# Patient Record
Sex: Female | Born: 1965 | Race: White | Hispanic: No | Marital: Married | Smoking: Never smoker
Health system: Southern US, Community
[De-identification: ages and names within clinical notes are randomized; demographics above are authoritative.]

## PROBLEM LIST (undated history)

## (undated) DIAGNOSIS — Z923 Personal history of irradiation: Secondary | ICD-10-CM

## (undated) DIAGNOSIS — O24419 Gestational diabetes mellitus in pregnancy, unspecified control: Secondary | ICD-10-CM

## (undated) DIAGNOSIS — C801 Malignant (primary) neoplasm, unspecified: Secondary | ICD-10-CM

## (undated) DIAGNOSIS — D702 Other drug-induced agranulocytosis: Secondary | ICD-10-CM

## (undated) DIAGNOSIS — M1990 Unspecified osteoarthritis, unspecified site: Secondary | ICD-10-CM

## (undated) DIAGNOSIS — C8590 Non-Hodgkin lymphoma, unspecified, unspecified site: Secondary | ICD-10-CM

## (undated) DIAGNOSIS — Q400 Congenital hypertrophic pyloric stenosis: Secondary | ICD-10-CM

## (undated) DIAGNOSIS — Z87442 Personal history of urinary calculi: Secondary | ICD-10-CM

## (undated) DIAGNOSIS — T4145XA Adverse effect of unspecified anesthetic, initial encounter: Secondary | ICD-10-CM

## (undated) DIAGNOSIS — C8510 Unspecified B-cell lymphoma, unspecified site: Secondary | ICD-10-CM

## (undated) HISTORY — DX: Personal history of irradiation: Z92.3

## (undated) HISTORY — PX: OTHER SURGICAL HISTORY: SHX169

## (undated) HISTORY — DX: Adverse effect of unspecified anesthetic, initial encounter: T41.45XA

## (undated) HISTORY — DX: Other disorders of bilirubin metabolism: E80.6

## (undated) HISTORY — PX: LYMPH NODE BIOPSY: SHX201

## (undated) HISTORY — DX: Unspecified B-cell lymphoma, unspecified site: C85.10

## (undated) HISTORY — DX: Malignant (primary) neoplasm, unspecified: C80.1

## (undated) HISTORY — PX: COLONOSCOPY: SHX174

## (undated) HISTORY — PX: MANDIBLE RECONSTRUCTION: SHX431

## (undated) HISTORY — DX: Congenital hypertrophic pyloric stenosis: Q40.0

## (undated) HISTORY — DX: Other drug-induced agranulocytosis: D70.2

## (undated) HISTORY — DX: Non-Hodgkin lymphoma, unspecified, unspecified site: C85.90

---
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
## 2000-01-14 ENCOUNTER — Other Ambulatory Visit: Admission: RE | Admit: 2000-01-14 | Discharge: 2000-01-14 | Payer: Self-pay | Admitting: Obstetrics and Gynecology

## 2000-07-03 ENCOUNTER — Encounter: Admission: RE | Admit: 2000-07-03 | Discharge: 2000-10-01 | Payer: Self-pay | Admitting: Obstetrics and Gynecology

## 2000-09-12 ENCOUNTER — Inpatient Hospital Stay (HOSPITAL_COMMUNITY): Admission: AD | Admit: 2000-09-12 | Discharge: 2000-09-13 | Payer: Self-pay | Admitting: Obstetrics and Gynecology

## 2000-09-15 ENCOUNTER — Encounter: Admission: RE | Admit: 2000-09-15 | Discharge: 2000-10-14 | Payer: Self-pay | Admitting: Obstetrics and Gynecology

## 2001-04-13 ENCOUNTER — Other Ambulatory Visit: Admission: RE | Admit: 2001-04-13 | Discharge: 2001-04-13 | Payer: Self-pay | Admitting: Obstetrics and Gynecology

## 2002-04-28 ENCOUNTER — Other Ambulatory Visit: Admission: RE | Admit: 2002-04-28 | Discharge: 2002-04-28 | Payer: Self-pay | Admitting: Obstetrics and Gynecology

## 2002-09-05 ENCOUNTER — Other Ambulatory Visit: Admission: RE | Admit: 2002-09-05 | Discharge: 2002-09-05 | Payer: Self-pay | Admitting: Obstetrics and Gynecology

## 2003-01-20 ENCOUNTER — Encounter: Admission: RE | Admit: 2003-01-20 | Discharge: 2003-04-20 | Payer: Self-pay

## 2003-09-07 ENCOUNTER — Other Ambulatory Visit: Admission: RE | Admit: 2003-09-07 | Discharge: 2003-09-07 | Payer: Self-pay | Admitting: Obstetrics and Gynecology

## 2004-09-12 ENCOUNTER — Other Ambulatory Visit: Admission: RE | Admit: 2004-09-12 | Discharge: 2004-09-12 | Payer: Self-pay | Admitting: Obstetrics and Gynecology

## 2007-10-12 ENCOUNTER — Emergency Department (HOSPITAL_COMMUNITY): Admission: EM | Admit: 2007-10-12 | Discharge: 2007-10-12 | Payer: Self-pay | Admitting: Emergency Medicine

## 2008-03-16 ENCOUNTER — Ambulatory Visit (HOSPITAL_COMMUNITY): Admission: RE | Admit: 2008-03-16 | Discharge: 2008-03-16 | Payer: Self-pay | Admitting: Family Medicine

## 2008-04-03 ENCOUNTER — Encounter (INDEPENDENT_AMBULATORY_CARE_PROVIDER_SITE_OTHER): Payer: Self-pay | Admitting: Diagnostic Radiology

## 2008-04-03 ENCOUNTER — Ambulatory Visit (HOSPITAL_COMMUNITY): Admission: RE | Admit: 2008-04-03 | Discharge: 2008-04-03 | Payer: Self-pay | Admitting: Family Medicine

## 2008-08-29 ENCOUNTER — Ambulatory Visit (HOSPITAL_COMMUNITY): Admission: RE | Admit: 2008-08-29 | Discharge: 2008-08-29 | Payer: Self-pay | Admitting: Family Medicine

## 2008-09-15 DIAGNOSIS — J189 Pneumonia, unspecified organism: Secondary | ICD-10-CM

## 2008-09-15 HISTORY — DX: Pneumonia, unspecified organism: J18.9

## 2008-09-27 ENCOUNTER — Ambulatory Visit (HOSPITAL_COMMUNITY): Admission: RE | Admit: 2008-09-27 | Discharge: 2008-09-27 | Payer: Self-pay | Admitting: Obstetrics and Gynecology

## 2008-09-29 ENCOUNTER — Encounter (INDEPENDENT_AMBULATORY_CARE_PROVIDER_SITE_OTHER): Payer: Self-pay | Admitting: Diagnostic Radiology

## 2008-09-29 ENCOUNTER — Ambulatory Visit (HOSPITAL_COMMUNITY): Admission: RE | Admit: 2008-09-29 | Discharge: 2008-09-29 | Payer: Self-pay | Admitting: Obstetrics and Gynecology

## 2008-10-03 ENCOUNTER — Ambulatory Visit: Payer: Self-pay | Admitting: Oncology

## 2008-10-06 ENCOUNTER — Ambulatory Visit (HOSPITAL_COMMUNITY): Admission: RE | Admit: 2008-10-06 | Discharge: 2008-10-06 | Payer: Self-pay | Admitting: Oncology

## 2008-10-11 LAB — CBC & DIFF AND RETIC
Basophils Absolute: 0 10*3/uL (ref 0.0–0.1)
EOS%: 2.1 % (ref 0.0–7.0)
HCT: 32.9 % — ABNORMAL LOW (ref 34.8–46.6)
HGB: 11.1 g/dL — ABNORMAL LOW (ref 11.6–15.9)
IRF: 0.29 (ref 0.130–0.330)
LYMPH%: 12.8 % — ABNORMAL LOW (ref 14.0–48.0)
MCH: 28.6 pg (ref 26.0–34.0)
MCV: 84.7 fL (ref 81.0–101.0)
NEUT%: 79.6 % — ABNORMAL HIGH (ref 39.6–76.8)
Platelets: 288 10*3/uL (ref 145–400)
lymph#: 1.1 10*3/uL (ref 0.9–3.3)

## 2008-10-11 LAB — MORPHOLOGY: PLT EST: ADEQUATE

## 2008-10-11 LAB — ERYTHROCYTE SEDIMENTATION RATE: Sed Rate: 45 mm/hr — ABNORMAL HIGH (ref 0–30)

## 2008-10-12 LAB — LACTATE DEHYDROGENASE: LDH: 199 U/L (ref 94–250)

## 2008-10-12 LAB — COMPREHENSIVE METABOLIC PANEL
ALT: 9 U/L (ref 0–35)
CO2: 28 mEq/L (ref 19–32)
Calcium: 9.8 mg/dL (ref 8.4–10.5)
Chloride: 100 mEq/L (ref 96–112)
Creatinine, Ser: 0.74 mg/dL (ref 0.40–1.20)
Glucose, Bld: 92 mg/dL (ref 70–99)
Total Protein: 6.9 g/dL (ref 6.0–8.3)

## 2008-10-12 LAB — CMV IGM: CMV IgM: <8 AU/mL (ref <30.0)

## 2008-10-12 LAB — CYTOMEGALOVIRUS ANTIBODY, IGG: Cytomegalovirus Ab-IgG: 1.1 U/mL — ABNORMAL HIGH (ref <0.4)

## 2008-10-12 LAB — EPSTEIN-BARR VIRUS VCA, IGM: EBV VCA IgM: 0.15 {ISR}

## 2008-10-12 LAB — HEPATITIS A ANTIBODY, IGM: Hep A IgM: NEGATIVE

## 2008-10-12 LAB — EPSTEIN-BARR VIRUS NUCLEAR ANTIGEN ANTIBODY, IGG: EBV NA IgG: 4.56 {ISR} — ABNORMAL HIGH

## 2008-10-16 ENCOUNTER — Ambulatory Visit (HOSPITAL_COMMUNITY): Admission: RE | Admit: 2008-10-16 | Discharge: 2008-10-16 | Payer: Self-pay | Admitting: Oncology

## 2008-10-16 ENCOUNTER — Encounter: Payer: Self-pay | Admitting: Oncology

## 2008-10-16 ENCOUNTER — Ambulatory Visit: Payer: Self-pay | Admitting: Oncology

## 2008-10-17 ENCOUNTER — Ambulatory Visit (HOSPITAL_COMMUNITY): Admission: RE | Admit: 2008-10-17 | Discharge: 2008-10-17 | Payer: Self-pay | Admitting: Oncology

## 2008-10-18 ENCOUNTER — Ambulatory Visit (HOSPITAL_COMMUNITY): Admission: RE | Admit: 2008-10-18 | Discharge: 2008-10-18 | Payer: Self-pay | Admitting: Oncology

## 2008-11-01 LAB — CBC WITH DIFFERENTIAL/PLATELET
EOS%: 1.5 % (ref 0.0–7.0)
MCH: 28.1 pg (ref 26.0–34.0)
MCV: 84.7 fL (ref 81.0–101.0)
MONO%: 5.3 % (ref 0.0–13.0)
NEUT#: 4.7 10*3/uL (ref 1.5–6.5)
RBC: 3.8 10*6/uL (ref 3.70–5.32)
RDW: 14.8 % — ABNORMAL HIGH (ref 11.3–14.5)
lymph#: 1.3 10*3/uL (ref 0.9–3.3)

## 2008-11-01 LAB — COMPREHENSIVE METABOLIC PANEL
BUN: 16 mg/dL (ref 6–23)
CO2: 27 mEq/L (ref 19–32)
Calcium: 9.6 mg/dL (ref 8.4–10.5)
Chloride: 104 mEq/L (ref 96–112)
Creatinine, Ser: 0.78 mg/dL (ref 0.40–1.20)
Glucose, Bld: 89 mg/dL (ref 70–99)

## 2008-11-01 LAB — LACTATE DEHYDROGENASE: LDH: 151 U/L (ref 94–250)

## 2008-11-01 LAB — MORPHOLOGY

## 2008-11-01 LAB — URIC ACID: Uric Acid, Serum: 2.7 mg/dL (ref 2.4–7.0)

## 2008-11-06 ENCOUNTER — Ambulatory Visit (HOSPITAL_COMMUNITY): Admission: RE | Admit: 2008-11-06 | Discharge: 2008-11-06 | Payer: Self-pay | Admitting: Interventional Radiology

## 2008-11-09 LAB — CBC WITH DIFFERENTIAL/PLATELET
BASO%: 1.3 % (ref 0.0–2.0)
Eosinophils Absolute: 0 10*3/uL (ref 0.0–0.5)
HCT: 35.9 % (ref 34.8–46.6)
MCHC: 32.6 g/dL (ref 31.5–36.0)
MONO#: 0.3 10*3/uL (ref 0.1–0.9)
NEUT#: 4.1 10*3/uL (ref 1.5–6.5)
NEUT%: 75.9 % (ref 38.4–76.8)
RBC: 4.2 10*6/uL (ref 3.70–5.45)
WBC: 5.4 10*3/uL (ref 3.9–10.3)
lymph#: 0.9 10*3/uL (ref 0.9–3.3)
nRBC: 0 % (ref 0–0)

## 2008-11-09 LAB — PROTIME-INR

## 2008-11-22 ENCOUNTER — Ambulatory Visit: Payer: Self-pay | Admitting: Oncology

## 2008-11-24 LAB — COMPREHENSIVE METABOLIC PANEL
ALT: 26 U/L (ref 0–35)
CO2: 28 mEq/L (ref 19–32)
Creatinine, Ser: 0.71 mg/dL (ref 0.40–1.20)
Total Bilirubin: 0.6 mg/dL (ref 0.3–1.2)

## 2008-11-24 LAB — CBC WITH DIFFERENTIAL/PLATELET
Basophils Absolute: 0 10*3/uL (ref 0.0–0.1)
EOS%: 1.7 % (ref 0.0–7.0)
Eosinophils Absolute: 0.1 10*3/uL (ref 0.0–0.5)
HGB: 10.9 g/dL — ABNORMAL LOW (ref 11.6–15.9)
MCH: 28.8 pg (ref 25.1–34.0)
MONO#: 0.5 10*3/uL (ref 0.1–0.9)
NEUT#: 6 10*3/uL (ref 1.5–6.5)
RDW: 19.9 % — ABNORMAL HIGH (ref 11.2–14.5)
WBC: 7.5 10*3/uL (ref 3.9–10.3)
lymph#: 1 10*3/uL (ref 0.9–3.3)

## 2008-11-24 LAB — MORPHOLOGY: PLT EST: ADEQUATE

## 2008-11-24 LAB — LACTATE DEHYDROGENASE: LDH: 151 U/L (ref 94–250)

## 2008-11-24 LAB — URIC ACID: Uric Acid, Serum: 4.3 mg/dL (ref 2.4–7.0)

## 2008-11-30 LAB — CBC WITH DIFFERENTIAL/PLATELET
BASO%: 0.5 % (ref 0.0–2.0)
EOS%: 0.7 % (ref 0.0–7.0)
HCT: 33.3 % — ABNORMAL LOW (ref 34.8–46.6)
LYMPH%: 12.3 % — ABNORMAL LOW (ref 14.0–49.7)
MCH: 27.9 pg (ref 25.1–34.0)
MCHC: 32.1 g/dL (ref 31.5–36.0)
NEUT%: 83.5 % — ABNORMAL HIGH (ref 38.4–76.8)
RBC: 3.84 10*6/uL (ref 3.70–5.45)
WBC: 5.7 10*3/uL (ref 3.9–10.3)
lymph#: 0.7 10*3/uL — ABNORMAL LOW (ref 0.9–3.3)
nRBC: 0 % (ref 0–0)

## 2008-12-12 ENCOUNTER — Ambulatory Visit (HOSPITAL_COMMUNITY): Admission: RE | Admit: 2008-12-12 | Discharge: 2008-12-12 | Payer: Self-pay | Admitting: Oncology

## 2008-12-15 LAB — COMPREHENSIVE METABOLIC PANEL
ALT: 60 U/L — ABNORMAL HIGH (ref 0–35)
AST: 28 U/L (ref 0–37)
CO2: 29 mEq/L (ref 19–32)
Chloride: 104 mEq/L (ref 96–112)
Sodium: 142 mEq/L (ref 135–145)
Total Bilirubin: 0.8 mg/dL (ref 0.3–1.2)
Total Protein: 6.5 g/dL (ref 6.0–8.3)

## 2008-12-15 LAB — LACTATE DEHYDROGENASE: LDH: 199 U/L (ref 94–250)

## 2008-12-15 LAB — CBC WITH DIFFERENTIAL/PLATELET
Eosinophils Absolute: 0.1 10*3/uL (ref 0.0–0.5)
HCT: 31.4 % — ABNORMAL LOW (ref 34.8–46.6)
LYMPH%: 11.8 % — ABNORMAL LOW (ref 14.0–49.7)
MCHC: 32.8 g/dL (ref 31.5–36.0)
MCV: 88.2 fL (ref 79.5–101.0)
MONO#: 0.4 10*3/uL (ref 0.1–0.9)
MONO%: 6.3 % (ref 0.0–14.0)
NEUT#: 5.4 10*3/uL (ref 1.5–6.5)
NEUT%: 80 % — ABNORMAL HIGH (ref 38.4–76.8)
Platelets: 135 10*3/uL — ABNORMAL LOW (ref 145–400)
WBC: 6.8 10*3/uL (ref 3.9–10.3)

## 2008-12-21 LAB — CBC WITH DIFFERENTIAL/PLATELET
BASO%: 0.7 % (ref 0.0–2.0)
Basophils Absolute: 0.1 10*3/uL (ref 0.0–0.1)
EOS%: 1.1 % (ref 0.0–7.0)
HCT: 31.7 % — ABNORMAL LOW (ref 34.8–46.6)
LYMPH%: 7.9 % — ABNORMAL LOW (ref 14.0–49.7)
MCH: 29.2 pg (ref 25.1–34.0)
MCHC: 33.1 g/dL (ref 31.5–36.0)
MCV: 88.1 fL (ref 79.5–101.0)
MONO%: 5.8 % (ref 0.0–14.0)
NEUT%: 84.5 % — ABNORMAL HIGH (ref 38.4–76.8)
Platelets: 257 10*3/uL (ref 145–400)
lymph#: 0.6 10*3/uL — ABNORMAL LOW (ref 0.9–3.3)

## 2009-01-01 LAB — CBC WITH DIFFERENTIAL/PLATELET
BASO%: 0 % (ref 0.0–2.0)
EOS%: 1.7 % (ref 0.0–7.0)
MCH: 30.7 pg (ref 25.1–34.0)
MCHC: 34.3 g/dL (ref 31.5–36.0)
MCV: 89.6 fL (ref 79.5–101.0)
MONO%: 6.5 % (ref 0.0–14.0)
RBC: 3 10*6/uL — ABNORMAL LOW (ref 3.70–5.45)
RDW: 20.2 % — ABNORMAL HIGH (ref 11.2–14.5)
lymph#: 0.3 10*3/uL — ABNORMAL LOW (ref 0.9–3.3)

## 2009-01-02 LAB — COMPREHENSIVE METABOLIC PANEL
ALT: 31 U/L (ref 0–35)
AST: 22 U/L (ref 0–37)
Albumin: 3.3 g/dL — ABNORMAL LOW (ref 3.5–5.2)
Alkaline Phosphatase: 22 U/L — ABNORMAL LOW (ref 39–117)
BUN: 9 mg/dL (ref 6–23)
Calcium: 9 mg/dL (ref 8.4–10.5)
Chloride: 103 mEq/L (ref 96–112)
Potassium: 4.5 mEq/L (ref 3.5–5.3)

## 2009-01-05 ENCOUNTER — Ambulatory Visit (HOSPITAL_COMMUNITY): Admission: RE | Admit: 2009-01-05 | Discharge: 2009-01-05 | Payer: Self-pay | Admitting: Oncology

## 2009-01-08 ENCOUNTER — Ambulatory Visit (HOSPITAL_COMMUNITY): Admission: RE | Admit: 2009-01-08 | Discharge: 2009-01-08 | Payer: Self-pay | Admitting: Oncology

## 2009-01-09 ENCOUNTER — Ambulatory Visit: Payer: Self-pay | Admitting: Oncology

## 2009-01-18 LAB — CBC WITH DIFFERENTIAL/PLATELET
Basophils Absolute: 0.1 10*3/uL (ref 0.0–0.1)
Eosinophils Absolute: 0.3 10*3/uL (ref 0.0–0.5)
HCT: 32.9 % — ABNORMAL LOW (ref 34.8–46.6)
HGB: 10.6 g/dL — ABNORMAL LOW (ref 11.6–15.9)
LYMPH%: 12.8 % — ABNORMAL LOW (ref 14.0–49.7)
MCV: 90.6 fL (ref 79.5–101.0)
MONO#: 0.4 10*3/uL (ref 0.1–0.9)
MONO%: 6.2 % (ref 0.0–14.0)
NEUT#: 4.7 10*3/uL (ref 1.5–6.5)
NEUT%: 74.9 % (ref 38.4–76.8)
Platelets: 334 10*3/uL (ref 145–400)
RBC: 3.63 10*6/uL — ABNORMAL LOW (ref 3.70–5.45)
WBC: 6.3 10*3/uL (ref 3.9–10.3)

## 2009-01-24 LAB — CBC WITH DIFFERENTIAL/PLATELET
BASO%: 0.7 % (ref 0.0–2.0)
HCT: 32.6 % — ABNORMAL LOW (ref 34.8–46.6)
LYMPH%: 31.9 % (ref 14.0–49.7)
MCHC: 32.8 g/dL (ref 31.5–36.0)
MCV: 90.1 fL (ref 79.5–101.0)
MONO#: 0 10*3/uL — ABNORMAL LOW (ref 0.1–0.9)
MONO%: 2.2 % (ref 0.0–14.0)
NEUT%: 44.9 % (ref 38.4–76.8)
Platelets: 187 10*3/uL (ref 145–400)
RBC: 3.62 10*6/uL — ABNORMAL LOW (ref 3.70–5.45)
WBC: 1.4 10*3/uL — ABNORMAL LOW (ref 3.9–10.3)

## 2009-01-24 LAB — COMPREHENSIVE METABOLIC PANEL
AST: 9 U/L (ref 0–37)
Albumin: 4 g/dL (ref 3.5–5.2)
CO2: 31 mEq/L (ref 19–32)
Calcium: 8.8 mg/dL (ref 8.4–10.5)
Sodium: 140 mEq/L (ref 135–145)

## 2009-01-24 LAB — LACTATE DEHYDROGENASE: LDH: 137 U/L (ref 94–250)

## 2009-01-31 LAB — CBC WITH DIFFERENTIAL/PLATELET
Basophils Absolute: 0 10*3/uL (ref 0.0–0.1)
Eosinophils Absolute: 0.2 10*3/uL (ref 0.0–0.5)
HGB: 9.6 g/dL — ABNORMAL LOW (ref 11.6–15.9)
MONO#: 0.4 10*3/uL (ref 0.1–0.9)
MONO%: 7.2 % (ref 0.0–14.0)
NEUT#: 4.4 10*3/uL (ref 1.5–6.5)
RBC: 3.15 10*6/uL — ABNORMAL LOW (ref 3.70–5.45)
RDW: 15.9 % — ABNORMAL HIGH (ref 11.2–14.5)
WBC: 5.6 10*3/uL (ref 3.9–10.3)
lymph#: 0.6 10*3/uL — ABNORMAL LOW (ref 0.9–3.3)

## 2009-02-08 LAB — CBC WITH DIFFERENTIAL/PLATELET
Basophils Absolute: 0 10*3/uL (ref 0.0–0.1)
Eosinophils Absolute: 0.1 10*3/uL (ref 0.0–0.5)
HCT: 31.2 % — ABNORMAL LOW (ref 34.8–46.6)
HGB: 10.1 g/dL — ABNORMAL LOW (ref 11.6–15.9)
LYMPH%: 12 % — ABNORMAL LOW (ref 14.0–49.7)
MCV: 91.5 fL (ref 79.5–101.0)
MONO#: 0.2 10*3/uL (ref 0.1–0.9)
NEUT#: 4.3 10*3/uL (ref 1.5–6.5)
Platelets: 267 10*3/uL (ref 145–400)
RBC: 3.41 10*6/uL — ABNORMAL LOW (ref 3.70–5.45)
WBC: 5.2 10*3/uL (ref 3.9–10.3)

## 2009-02-15 LAB — CBC WITH DIFFERENTIAL/PLATELET
BASO%: 1.2 % (ref 0.0–2.0)
Basophils Absolute: 0 10*3/uL (ref 0.0–0.1)
EOS%: 18.6 % — ABNORMAL HIGH (ref 0.0–7.0)
HCT: 29.7 % — ABNORMAL LOW (ref 34.8–46.6)
LYMPH%: 58.6 % — ABNORMAL HIGH (ref 14.0–49.7)
MCH: 31.4 pg (ref 25.1–34.0)
MCHC: 34.9 g/dL (ref 31.5–36.0)
MCV: 90 fL (ref 79.5–101.0)
MONO%: 15.7 % — ABNORMAL HIGH (ref 0.0–14.0)
NEUT%: 5.9 % — ABNORMAL LOW (ref 38.4–76.8)
Platelets: 147 10*3/uL (ref 145–400)

## 2009-02-23 ENCOUNTER — Ambulatory Visit: Payer: Self-pay | Admitting: Oncology

## 2009-02-23 LAB — CBC WITH DIFFERENTIAL/PLATELET
BASO%: 0.7 % (ref 0.0–2.0)
EOS%: 1.2 % (ref 0.0–7.0)
MCH: 31.8 pg (ref 25.1–34.0)
MCHC: 34.9 g/dL (ref 31.5–36.0)
MCV: 91.1 fL (ref 79.5–101.0)
MONO%: 11.4 % (ref 0.0–14.0)
RDW: 16.7 % — ABNORMAL HIGH (ref 11.2–14.5)
lymph#: 0.5 10*3/uL — ABNORMAL LOW (ref 0.9–3.3)

## 2009-03-01 LAB — CBC WITH DIFFERENTIAL/PLATELET
BASO%: 0.5 % (ref 0.0–2.0)
Basophils Absolute: 0 10*3/uL (ref 0.0–0.1)
EOS%: 0.9 % (ref 0.0–7.0)
Eosinophils Absolute: 0 10*3/uL (ref 0.0–0.5)
HCT: 30.1 % — ABNORMAL LOW (ref 34.8–46.6)
HGB: 10.8 g/dL — ABNORMAL LOW (ref 11.6–15.9)
LYMPH%: 23.1 % (ref 14.0–49.7)
MCH: 32.7 pg (ref 25.1–34.0)
MCHC: 35.8 g/dL (ref 31.5–36.0)
MCV: 91.4 fL (ref 79.5–101.0)
MONO#: 0.4 10*3/uL (ref 0.1–0.9)
MONO%: 12.9 % (ref 0.0–14.0)
NEUT#: 2.1 10*3/uL (ref 1.5–6.5)
NEUT%: 62.6 % (ref 38.4–76.8)
Platelets: 288 10*3/uL (ref 145–400)
RBC: 3.29 10*6/uL — ABNORMAL LOW (ref 3.70–5.45)
RDW: 17.8 % — ABNORMAL HIGH (ref 11.2–14.5)
WBC: 3.4 10*3/uL — ABNORMAL LOW (ref 3.9–10.3)
lymph#: 0.8 10*3/uL — ABNORMAL LOW (ref 0.9–3.3)

## 2009-03-08 ENCOUNTER — Ambulatory Visit (HOSPITAL_COMMUNITY): Admission: RE | Admit: 2009-03-08 | Discharge: 2009-03-08 | Payer: Self-pay | Admitting: Family Medicine

## 2009-03-12 ENCOUNTER — Ambulatory Visit (HOSPITAL_COMMUNITY): Admission: RE | Admit: 2009-03-12 | Discharge: 2009-03-12 | Payer: Self-pay | Admitting: Oncology

## 2009-03-13 LAB — CBC WITH DIFFERENTIAL/PLATELET
Basophils Absolute: 0 10*3/uL (ref 0.0–0.1)
Eosinophils Absolute: 0.1 10*3/uL (ref 0.0–0.5)
HGB: 12 g/dL (ref 11.6–15.9)
MCV: 90.5 fL (ref 79.5–101.0)
MONO#: 0.4 10*3/uL (ref 0.1–0.9)
MONO%: 17.1 % — ABNORMAL HIGH (ref 0.0–14.0)
NEUT#: 1.2 10*3/uL — ABNORMAL LOW (ref 1.5–6.5)
RBC: 3.79 10*6/uL (ref 3.70–5.45)
RDW: 16.8 % — ABNORMAL HIGH (ref 11.2–14.5)
WBC: 2.4 10*3/uL — ABNORMAL LOW (ref 3.9–10.3)

## 2009-03-13 LAB — COMPREHENSIVE METABOLIC PANEL
ALT: 17 U/L (ref 0–35)
AST: 16 U/L (ref 0–37)
Alkaline Phosphatase: 23 U/L — ABNORMAL LOW (ref 39–117)
BUN: 14 mg/dL (ref 6–23)
Calcium: 10.4 mg/dL (ref 8.4–10.5)
Chloride: 101 mEq/L (ref 96–112)
Creatinine, Ser: 0.73 mg/dL (ref 0.40–1.20)
Total Bilirubin: 1.1 mg/dL (ref 0.3–1.2)

## 2009-03-13 LAB — MORPHOLOGY: PLT EST: ADEQUATE

## 2009-03-13 LAB — ERYTHROCYTE SEDIMENTATION RATE: Sed Rate: 15 mm/hr (ref 0–30)

## 2009-04-04 ENCOUNTER — Ambulatory Visit (HOSPITAL_COMMUNITY): Admission: RE | Admit: 2009-04-04 | Discharge: 2009-04-04 | Payer: Self-pay | Admitting: Oncology

## 2009-07-12 ENCOUNTER — Ambulatory Visit: Payer: Self-pay | Admitting: Oncology

## 2009-07-16 ENCOUNTER — Ambulatory Visit (HOSPITAL_COMMUNITY): Admission: RE | Admit: 2009-07-16 | Discharge: 2009-07-16 | Payer: Self-pay | Admitting: Oncology

## 2009-07-16 LAB — CBC WITH DIFFERENTIAL/PLATELET
BASO%: 0.4 % (ref 0.0–2.0)
Basophils Absolute: 0 10*3/uL (ref 0.0–0.1)
EOS%: 3.9 % (ref 0.0–7.0)
HGB: 12.6 g/dL (ref 11.6–15.9)
MCH: 32.3 pg (ref 25.1–34.0)
MCHC: 35.1 g/dL (ref 31.5–36.0)
RBC: 3.88 10*6/uL (ref 3.70–5.45)
RDW: 13.1 % (ref 11.2–14.5)
lymph#: 0.7 10*3/uL — ABNORMAL LOW (ref 0.9–3.3)

## 2009-07-16 LAB — COMPREHENSIVE METABOLIC PANEL
ALT: 29 U/L (ref 0–35)
AST: 23 U/L (ref 0–37)
Alkaline Phosphatase: 25 U/L — ABNORMAL LOW (ref 39–117)
BUN: 14 mg/dL (ref 6–23)
Creatinine, Ser: 0.72 mg/dL (ref 0.40–1.20)
Total Bilirubin: 1.8 mg/dL — ABNORMAL HIGH (ref 0.3–1.2)

## 2009-07-16 LAB — MORPHOLOGY
PLT EST: ADEQUATE
RBC Comments: NORMAL

## 2009-10-12 ENCOUNTER — Ambulatory Visit: Payer: Self-pay | Admitting: Oncology

## 2009-10-16 ENCOUNTER — Ambulatory Visit (HOSPITAL_COMMUNITY): Admission: RE | Admit: 2009-10-16 | Discharge: 2009-10-16 | Payer: Self-pay | Admitting: Oncology

## 2009-10-16 ENCOUNTER — Encounter: Payer: Self-pay | Admitting: Oncology

## 2009-10-16 LAB — CBC WITH DIFFERENTIAL/PLATELET
Basophils Absolute: 0 10*3/uL (ref 0.0–0.1)
EOS%: 3.8 % (ref 0.0–7.0)
Eosinophils Absolute: 0.1 10*3/uL (ref 0.0–0.5)
HCT: 39.3 % (ref 34.8–46.6)
HGB: 13.7 g/dL (ref 11.6–15.9)
MCH: 32.2 pg (ref 25.1–34.0)
MCV: 92.4 fL (ref 79.5–101.0)
MONO%: 7.3 % (ref 0.0–14.0)
NEUT#: 1.4 10*3/uL — ABNORMAL LOW (ref 1.5–6.5)
NEUT%: 50.8 % (ref 38.4–76.8)

## 2009-10-16 LAB — COMPREHENSIVE METABOLIC PANEL
AST: 20 U/L (ref 0–37)
Albumin: 4.7 g/dL (ref 3.5–5.2)
Alkaline Phosphatase: 23 U/L — ABNORMAL LOW (ref 39–117)
BUN: 15 mg/dL (ref 6–23)
Calcium: 10.5 mg/dL (ref 8.4–10.5)
Chloride: 102 mEq/L (ref 96–112)
Potassium: 4.3 mEq/L (ref 3.5–5.3)
Sodium: 141 mEq/L (ref 135–145)
Total Protein: 7.5 g/dL (ref 6.0–8.3)

## 2009-10-16 LAB — MORPHOLOGY

## 2009-11-14 ENCOUNTER — Ambulatory Visit: Payer: Self-pay | Admitting: Oncology

## 2009-12-11 IMAGING — NM NM CARDIA MUGA REST
3 series · 18 of 18 positions shown · non-contrast
Comparison: None

CLINICAL DATA: Lymphoma, pre chemotherapy evaluation

NUCLEAR MEDICINE CARDIAC MULTIPLE UPTAKE GATED ACQUISITION SCAN
TECHNIQUE: Radiolabeled red blood cells used to perform resting
radionuclide ventriculography. Imaging performed in the anterior,
LAO, and lateral projections. Resting left ventricular ejection
fraction estimated after drawing region of interest curves around
the left ventricle during systole and diastole.
Radiopharmaceutical: 23.7 mCi technetium 99 labeled red blood cells

[Series 1: mu muga · 4.30mm/px · 6 of 16 frames shown (1 of 3)]
[frame 2/16]
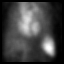
[frame 4/16]
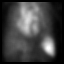
[frame 7/16]
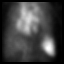
[frame 10/16]
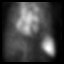
[frame 12/16]
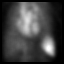
[frame 15/16]
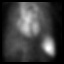

[Series 1: mu muga · 4.30mm/px · 6 of 16 frames shown (2 of 3)]
[frame 2/16]
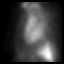
[frame 4/16]
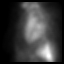
[frame 7/16]
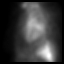
[frame 10/16]
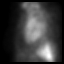
[frame 12/16]
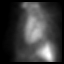
[frame 15/16]
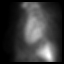

[Series 1: mu muga · 4.30mm/px · 6 of 16 frames shown (3 of 3)]
[frame 2/16]
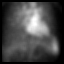
[frame 4/16]
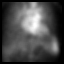
[frame 7/16]
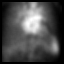
[frame 10/16]
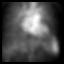
[frame 12/16]
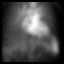
[frame 15/16]
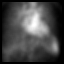

[18 of 18 positions shown; findings below may reference images not displayed]

FINDINGS: No wall motion abnormality.  Calculated left ventricular
ejection fraction equal 76%.
IMPRESSION: 1.  No wall motion abnormality.
2.  Left ventricular ejection fraction equal 76%

REF:W2 DICTATED: 10/17/2008 [DATE]

## 2010-01-23 ENCOUNTER — Encounter: Admission: RE | Admit: 2010-01-23 | Discharge: 2010-01-23 | Payer: Self-pay | Admitting: Obstetrics and Gynecology

## 2010-02-05 IMAGING — CT CT CHEST W/ CM
2 of 3 series · 16 of 46 positions shown, 18 images · IV contrast (agent unspecified)
Comparison: 09/27/2008

CT CHEST

CLINICAL DATA: Restaging non-Hodgkins lymphoma

CT CHEST, ABDOMEN AND PELVIS WITH CONTRAST
TECHNIQUE: Multidetector CT imaging of the chest, abdomen and
pelvis was performed following the standard protocol during bolus
administration of intravenous contrast.
Contrast: 100 ml of omni 300

[Series 2: cap with st · axial · 0.72mm/px · z∈[-642,-102]mm · 13 of 125 slices shown, 15 images]
[im 9/125  soft-tissue]
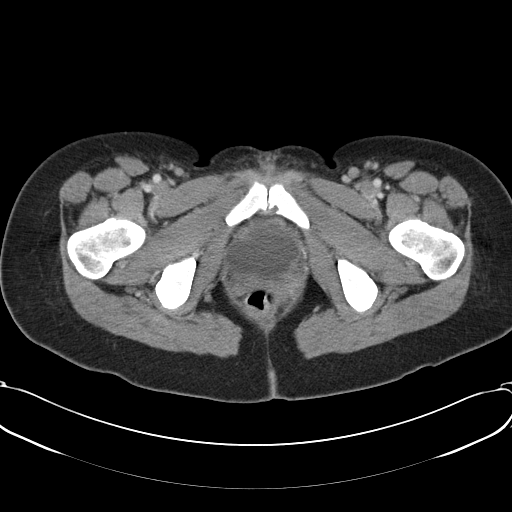
[im 9/125  bone]
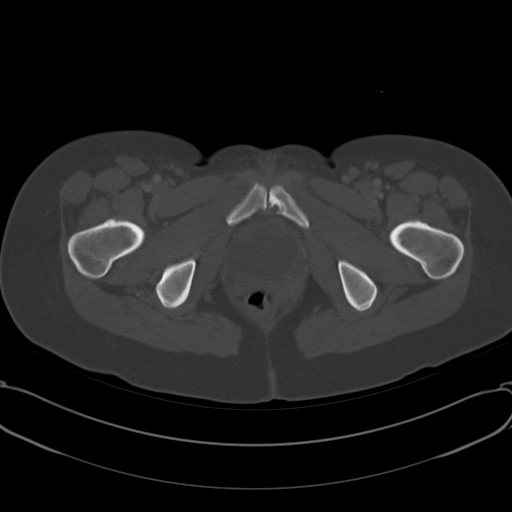
[im 17/125  soft-tissue]
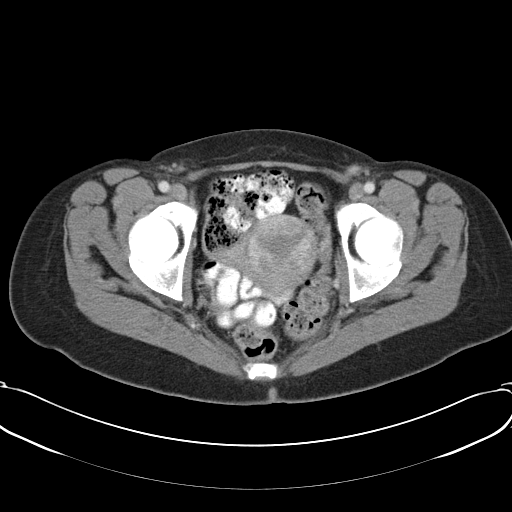
[im 25/125  soft-tissue]
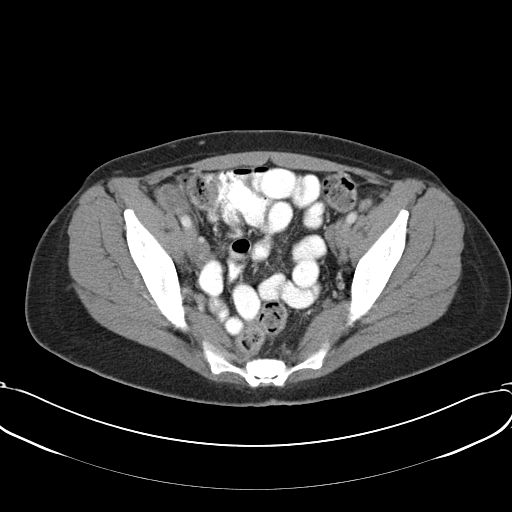
[im 37/125  soft-tissue]
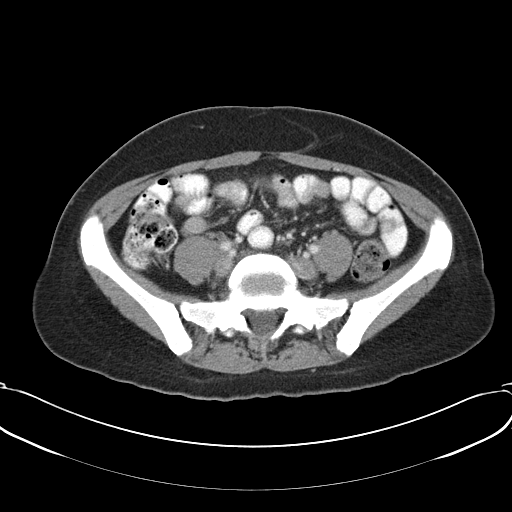
[im 45/125  soft-tissue]
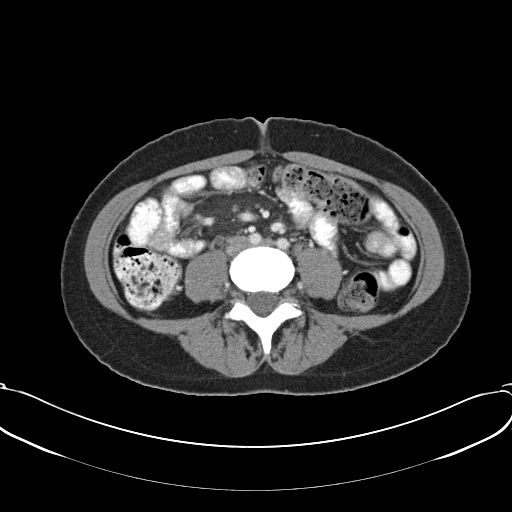
[im 53/125  soft-tissue]
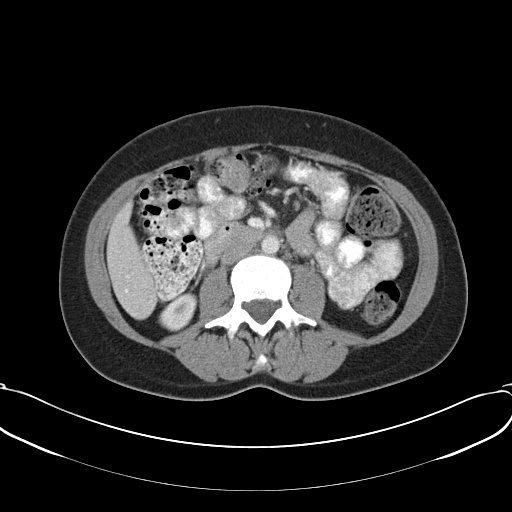
[im 65/125  soft-tissue]
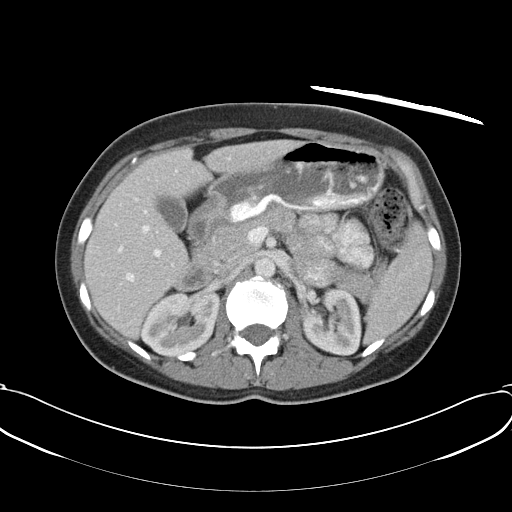
[im 73/125  soft-tissue]
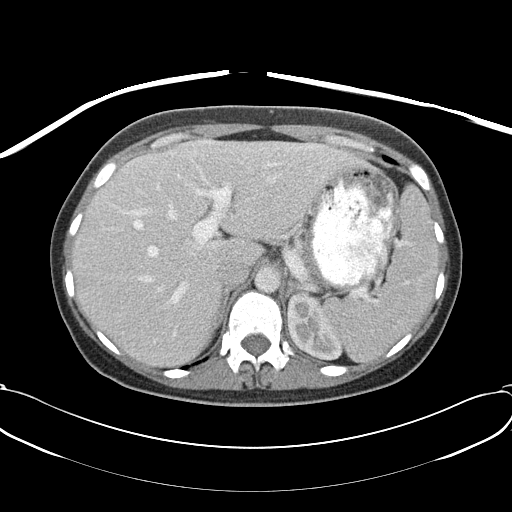
[im 81/125  soft-tissue]
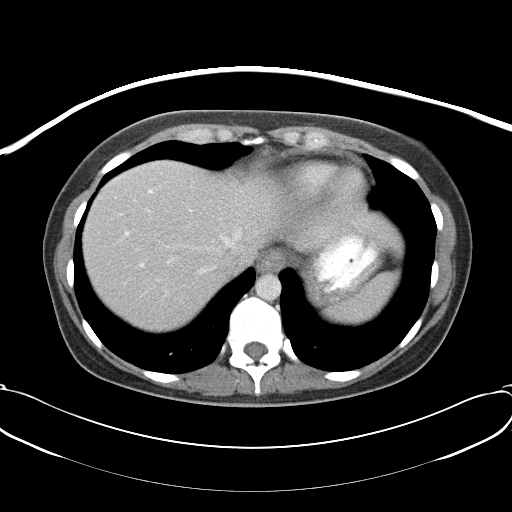
[im 81/125  bone]
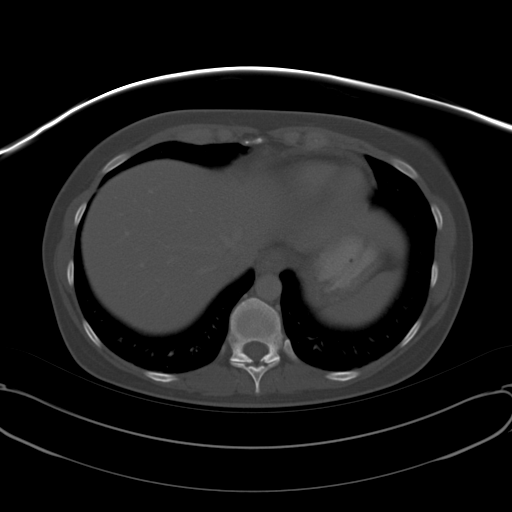
[im 89/125  soft-tissue]
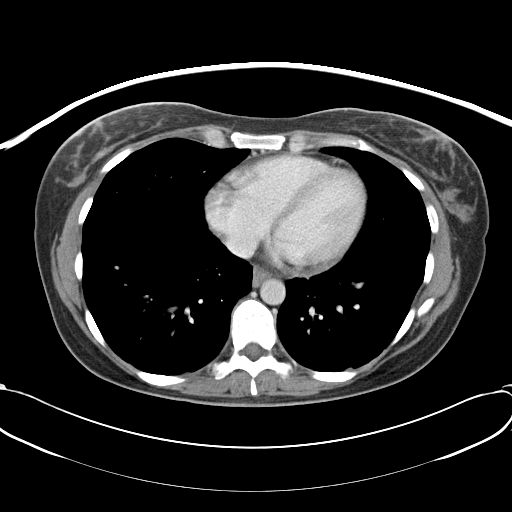
[im 101/125  soft-tissue]
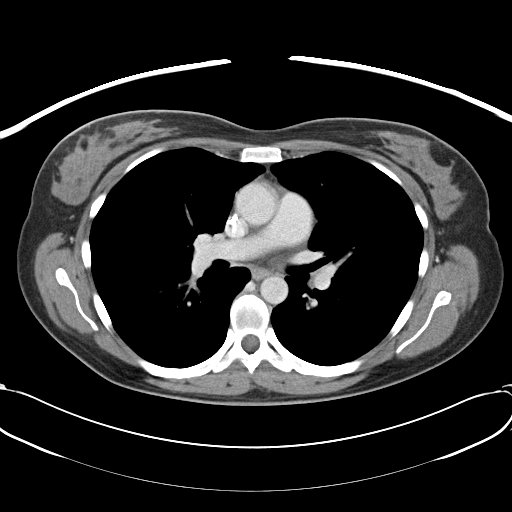
[im 109/125  soft-tissue]
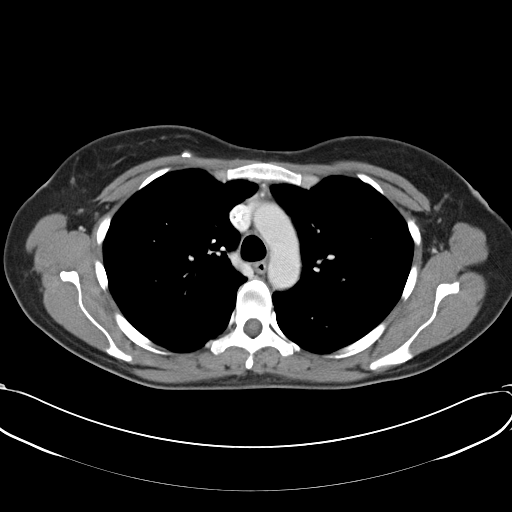
[im 117/125  soft-tissue]
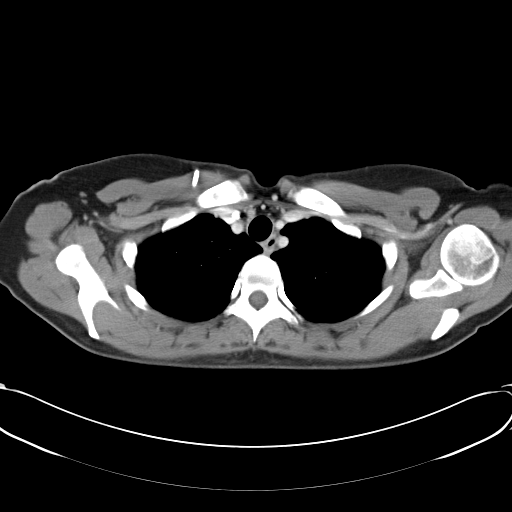

[Series 602: <mpr thick range> · coronal · 1.22mm/px · 3 of 66 slices shown]
[im 22/66  soft-tissue]
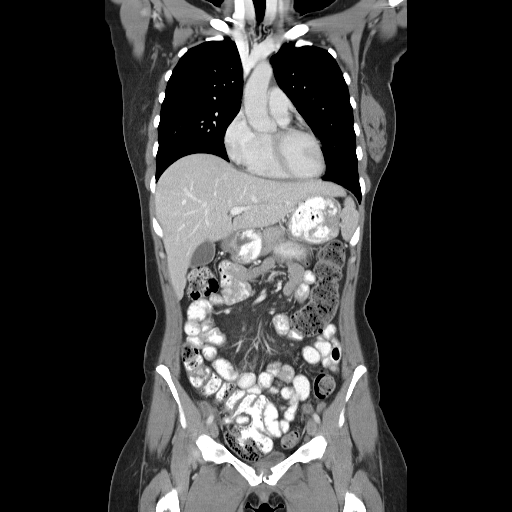
[im 29/66  soft-tissue]
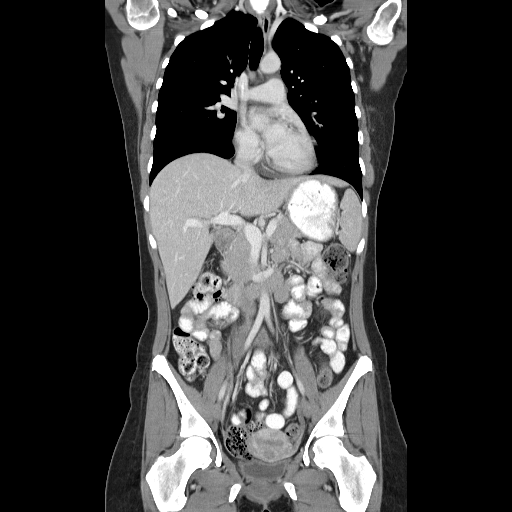
[im 37/66  soft-tissue]
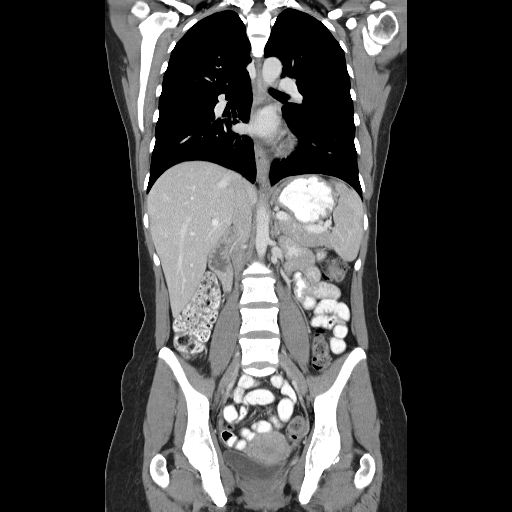

[16 of 46 positions shown; findings below may reference images not displayed]

FINDINGS: There is no enlarged supraclavicular lymph nodes.

Interval decrease in size of the axillary lymph nodes.  For example
left axillary lymph node currently measures 0.6 cm, image number
12.  This is compared with 1.2 cm previously.

Right axillary lymph node measures 0.4 mm, image 16.  This is
compared with 1 cm previously.

Precarinal lymph node measures 0.5 cm, image 20.  This is compared
with 1.1 cm previously.

There is a small pericardial effusion versus thickening.

No pleural effusion.

No pulmonary nodules or masses noted.

Review of the visualized osseous structures shows no worrisome bone
lesions.
IMPRESSION: 1.  Interval decrease in size of axillary adenopathy and
mediastinal adenopathy.
2.  No new findings.

CT ABDOMEN
FINDINGS: The spleen measures 11 cm in craniocaudal dimension.  On
the previous examination the spleen measured 12.2 cm.

There are no focal splenic abnormalities noted.

The pancreas there is normal.

Both adrenal glands are normal.

The kidneys are both normal.

The liver parenchyma is unremarkable.

The gallbladder is normal.  There is no biliary dilatation.

There is been interval resolution of periaortic adenopathy.  Left
sided periaortic lymph node measures 0.4 cm, image 75.  This is
compared with 1 cm previously.

Review of the visualized osseous structures is unremarkable.
IMPRESSION: 1.  No evidence for upper abdominal adenopathy.  No mass.

CT PELVIS
FINDINGS: Previously noted common iliac adenopathy has resolved in the
interval.

There has been interval decrease in the external iliac adenopathy.
The right external iliac lymph node currently measures 1.9 cm,
image 101.  This is compared with 5.4 cm previously.

The left external iliac lymph node measures 0.8 cm, image 100.
This is compared with 2.4 cm previously.

There has been interval improvement in inguinal adenopathy.
Enlarged right inguinal lymph node currently measures 0.8 cm, image
113.  This is compared with 1.4 cm previously.

The uterus and the adnexal structures appear normal.

The urinary bladder is unremarkable.

The pelvic bowel loops are negative.

Review of the visualized osseous structures shows no lytic or
sclerotic lesions.
IMPRESSION: 1.  There has been interval response to therapy.  Specifically,
there has been complete resolution of previously noted common iliac
lymph nodes.  There is been significant decrease in size of
external iliac and inguinal lymph nodes.  Right external iliac
lymph node remains pathologically enlarged as above.  Residual
tumor cannot be excluded.  If there is clinical concern for
residual tumor a PET CT may be helpful to assess the degree of
hypermetabolism within this enlarged right external iliac lymph
node.

## 2010-02-14 ENCOUNTER — Ambulatory Visit: Payer: Self-pay | Admitting: Oncology

## 2010-02-15 ENCOUNTER — Ambulatory Visit (HOSPITAL_COMMUNITY): Admission: RE | Admit: 2010-02-15 | Discharge: 2010-02-15 | Payer: Self-pay | Admitting: Oncology

## 2010-02-15 LAB — MORPHOLOGY: PLT EST: ADEQUATE

## 2010-02-15 LAB — COMPREHENSIVE METABOLIC PANEL
ALT: 17 U/L (ref 0–35)
AST: 16 U/L (ref 0–37)
Alkaline Phosphatase: 24 U/L — ABNORMAL LOW (ref 39–117)
Sodium: 138 mEq/L (ref 135–145)
Total Bilirubin: 2 mg/dL — ABNORMAL HIGH (ref 0.3–1.2)
Total Protein: 6.7 g/dL (ref 6.0–8.3)

## 2010-02-15 LAB — CBC WITH DIFFERENTIAL/PLATELET
Basophils Absolute: 0 10*3/uL (ref 0.0–0.1)
Eosinophils Absolute: 0.4 10*3/uL (ref 0.0–0.5)
HGB: 13.5 g/dL (ref 11.6–15.9)
MCV: 93 fL (ref 79.5–101.0)
MONO#: 0.2 10*3/uL (ref 0.1–0.9)
MONO%: 5.9 % (ref 0.0–14.0)
NEUT#: 1.5 10*3/uL (ref 1.5–6.5)
RDW: 12.8 % (ref 11.2–14.5)
lymph#: 1.2 10*3/uL (ref 0.9–3.3)

## 2010-07-05 ENCOUNTER — Ambulatory Visit: Payer: Self-pay | Admitting: Oncology

## 2010-07-09 ENCOUNTER — Ambulatory Visit (HOSPITAL_COMMUNITY): Admission: RE | Admit: 2010-07-09 | Discharge: 2010-07-09 | Payer: Self-pay | Admitting: Oncology

## 2010-07-09 LAB — COMPREHENSIVE METABOLIC PANEL
ALT: 25 U/L (ref 0–35)
CO2: 32 mEq/L (ref 19–32)
Creatinine, Ser: 0.71 mg/dL (ref 0.40–1.20)
Total Bilirubin: 1.9 mg/dL — ABNORMAL HIGH (ref 0.3–1.2)

## 2010-07-09 LAB — CBC WITH DIFFERENTIAL/PLATELET
BASO%: 0.7 % (ref 0.0–2.0)
EOS%: 8.3 % — ABNORMAL HIGH (ref 0.0–7.0)
MCH: 32.5 pg (ref 25.1–34.0)
MCHC: 34.9 g/dL (ref 31.5–36.0)
NEUT%: 43.7 % (ref 38.4–76.8)
RBC: 4.1 10*6/uL (ref 3.70–5.45)
RDW: 12.3 % (ref 11.2–14.5)
lymph#: 1.4 10*3/uL (ref 0.9–3.3)

## 2010-07-09 LAB — MORPHOLOGY: PLT EST: ADEQUATE

## 2010-07-09 LAB — LACTATE DEHYDROGENASE: LDH: 143 U/L (ref 94–250)

## 2010-08-16 ENCOUNTER — Encounter: Admission: RE | Admit: 2010-08-16 | Discharge: 2010-08-16 | Payer: Self-pay | Admitting: Obstetrics and Gynecology

## 2010-09-15 DIAGNOSIS — T8859XA Other complications of anesthesia, initial encounter: Secondary | ICD-10-CM

## 2010-09-15 HISTORY — DX: Other complications of anesthesia, initial encounter: T88.59XA

## 2010-10-06 ENCOUNTER — Encounter: Payer: Self-pay | Admitting: Oncology

## 2010-11-19 ENCOUNTER — Encounter (HOSPITAL_BASED_OUTPATIENT_CLINIC_OR_DEPARTMENT_OTHER): Payer: 59 | Admitting: Oncology

## 2010-11-19 ENCOUNTER — Other Ambulatory Visit: Payer: Self-pay | Admitting: Oncology

## 2010-11-19 DIAGNOSIS — C8190 Hodgkin lymphoma, unspecified, unspecified site: Secondary | ICD-10-CM

## 2010-11-19 DIAGNOSIS — C8596 Non-Hodgkin lymphoma, unspecified, intrapelvic lymph nodes: Secondary | ICD-10-CM

## 2010-11-19 LAB — CBC WITH DIFFERENTIAL/PLATELET
Basophils Absolute: 0 10*3/uL (ref 0.0–0.1)
Eosinophils Absolute: 0.4 10*3/uL (ref 0.0–0.5)
HCT: 37.9 % (ref 34.8–46.6)
HGB: 13.1 g/dL (ref 11.6–15.9)
LYMPH%: 38 % (ref 14.0–49.7)
MCV: 91.5 fL (ref 79.5–101.0)
MONO%: 6.4 % (ref 0.0–14.0)
NEUT#: 2.3 10*3/uL (ref 1.5–6.5)
Platelets: 215 10*3/uL (ref 145–400)

## 2010-11-19 LAB — COMPREHENSIVE METABOLIC PANEL
Albumin: 5.1 g/dL (ref 3.5–5.2)
Alkaline Phosphatase: 25 U/L — ABNORMAL LOW (ref 39–117)
BUN: 13 mg/dL (ref 6–23)
Glucose, Bld: 97 mg/dL (ref 70–99)
Total Bilirubin: 1.2 mg/dL (ref 0.3–1.2)

## 2010-11-19 LAB — LACTATE DEHYDROGENASE: LDH: 163 U/L (ref 94–250)

## 2010-12-17 ENCOUNTER — Ambulatory Visit (HOSPITAL_COMMUNITY)
Admission: RE | Admit: 2010-12-17 | Discharge: 2010-12-17 | Disposition: A | Discharge status: Home / Self Care | Payer: 59 | Source: Ambulatory Visit | Attending: Oncology | Admitting: Oncology

## 2010-12-17 DIAGNOSIS — C8190 Hodgkin lymphoma, unspecified, unspecified site: Secondary | ICD-10-CM

## 2010-12-17 DIAGNOSIS — Z87898 Personal history of other specified conditions: Secondary | ICD-10-CM | POA: Insufficient documentation

## 2010-12-17 DIAGNOSIS — Z09 Encounter for follow-up examination after completed treatment for conditions other than malignant neoplasm: Secondary | ICD-10-CM | POA: Insufficient documentation

## 2010-12-17 MED ORDER — IOHEXOL 300 MG/ML  SOLN
100.0000 mL | Freq: Once | INTRAMUSCULAR | Status: AC | PRN
  Administered 2010-12-17: 100 mL via INTRAVENOUS

## 2010-12-22 LAB — CBC
Hemoglobin: 12.6 g/dL (ref 12.0–15.0)
RBC: 4.01 MIL/uL (ref 3.87–5.11)

## 2010-12-30 LAB — CBC
HCT: 34.4 % — ABNORMAL LOW (ref 36.0–46.0)
MCV: 85.7 fL (ref 78.0–100.0)
RBC: 4.02 MIL/uL (ref 3.87–5.11)
WBC: 6.4 10*3/uL (ref 4.0–10.5)

## 2010-12-30 LAB — GLUCOSE, CAPILLARY: Glucose-Capillary: 92 mg/dL (ref 70–99)

## 2010-12-31 LAB — TISSUE HYBRIDIZATION (BONE MARROW)-NCBH

## 2010-12-31 LAB — CBC
HCT: 32.4 % — ABNORMAL LOW (ref 36.0–46.0)
Hemoglobin: 10.7 g/dL — ABNORMAL LOW (ref 12.0–15.0)
MCHC: 32.9 g/dL (ref 30.0–36.0)
RDW: 14.2 % (ref 11.5–15.5)

## 2010-12-31 LAB — DIFFERENTIAL
Basophils Absolute: 0 10*3/uL (ref 0.0–0.1)
Eosinophils Relative: 2 % (ref 0–5)
Lymphocytes Relative: 15 % (ref 12–46)
Monocytes Absolute: 0.4 10*3/uL (ref 0.1–1.0)
Monocytes Relative: 7 % (ref 3–12)

## 2011-01-28 NOTE — Op Note (Signed)
Katelyn Irwin, Katelyn Irwin               ACCOUNT NO.:  [id number]   MEDICAL RECORD NO.:  [id number]          PATIENT TYPE:  AMB   LOCATION:  [facility]                         FACILITY:  [facility]   PHYSICIAN:  Genene Churn. Granfortuna, M.D.DATE OF BIRTH:  Apr 18, 1966   DATE OF PROCEDURE:  10/16/2008  DATE OF DISCHARGE:                               OPERATIVE REPORT   PROCEDURE NOTE:  Bilateral posterior iliac crest bone marrow biopsies  with unilateral right aspiration done under local 2% lidocaine  anesthesia with 7 mg IV Versed premedication without complications.  Airway evaluation class I prior to initiation of procedure.   INDICATIONS:  Staging evaluation of newly-diagnosed high-grade non-  Hodgkin's lymphoma.      Genene Churn. Cyndie Chime, M.D.  Electronically Signed     JMG/MEDQ  D:  10/16/2008  T:  10/16/2008  Job:  [id number]

## 2011-03-24 ENCOUNTER — Other Ambulatory Visit: Payer: Self-pay | Admitting: Obstetrics and Gynecology

## 2011-03-24 DIAGNOSIS — N63 Unspecified lump in unspecified breast: Secondary | ICD-10-CM

## 2011-04-09 ENCOUNTER — Ambulatory Visit
Admission: RE | Admit: 2011-04-09 | Discharge: 2011-04-09 | Disposition: A | Discharge status: Home / Self Care | Payer: 59 | Source: Ambulatory Visit | Attending: Obstetrics and Gynecology | Admitting: Obstetrics and Gynecology

## 2011-04-09 ENCOUNTER — Other Ambulatory Visit: Payer: Self-pay | Admitting: Obstetrics and Gynecology

## 2011-04-09 DIAGNOSIS — N63 Unspecified lump in unspecified breast: Secondary | ICD-10-CM

## 2011-05-20 ENCOUNTER — Other Ambulatory Visit (HOSPITAL_COMMUNITY): Payer: 59

## 2011-05-23 ENCOUNTER — Encounter (HOSPITAL_COMMUNITY): Payer: Self-pay

## 2011-05-23 ENCOUNTER — Other Ambulatory Visit: Payer: Self-pay | Admitting: Oncology

## 2011-05-23 ENCOUNTER — Encounter: Payer: 59 | Admitting: Oncology

## 2011-05-23 ENCOUNTER — Ambulatory Visit (HOSPITAL_COMMUNITY)
Admission: RE | Admit: 2011-05-23 | Discharge: 2011-05-23 | Disposition: A | Discharge status: Home / Self Care | Payer: 59 | Source: Ambulatory Visit | Attending: Oncology | Admitting: Oncology

## 2011-05-23 DIAGNOSIS — Z23 Encounter for immunization: Secondary | ICD-10-CM

## 2011-05-23 DIAGNOSIS — C8190 Hodgkin lymphoma, unspecified, unspecified site: Secondary | ICD-10-CM | POA: Insufficient documentation

## 2011-05-23 LAB — MORPHOLOGY: PLT EST: ADEQUATE

## 2011-05-23 LAB — CBC WITH DIFFERENTIAL/PLATELET
BASO%: 0.6 % (ref 0.0–2.0)
Basophils Absolute: 0 10*3/uL (ref 0.0–0.1)
Eosinophils Absolute: 0.2 10*3/uL (ref 0.0–0.5)
HCT: 37.8 % (ref 34.8–46.6)
HGB: 13.2 g/dL (ref 11.6–15.9)
LYMPH%: 46.8 % (ref 14.0–49.7)
MCHC: 34.9 g/dL (ref 31.5–36.0)
MONO#: 0.2 10*3/uL (ref 0.1–0.9)
NEUT#: 1.3 10*3/uL — ABNORMAL LOW (ref 1.5–6.5)
NEUT%: 40.9 % (ref 38.4–76.8)
Platelets: 164 10*3/uL (ref 145–400)
WBC: 3.1 10*3/uL — ABNORMAL LOW (ref 3.9–10.3)
lymph#: 1.4 10*3/uL (ref 0.9–3.3)

## 2011-05-23 LAB — CMP (CANCER CENTER ONLY)
ALT(SGPT): 30 U/L (ref 10–47)
AST: 27 U/L (ref 11–38)
Alkaline Phosphatase: 26 U/L (ref 26–84)
CO2: 29 mEq/L (ref 18–33)
Creat: 0.8 mg/dl (ref 0.6–1.2)
Sodium: 148 mEq/L — ABNORMAL HIGH (ref 128–145)
Total Bilirubin: 1.8 mg/dl — ABNORMAL HIGH (ref 0.20–1.60)
Total Protein: 7.1 g/dL (ref 6.4–8.1)

## 2011-05-23 MED ORDER — IOHEXOL 300 MG/ML  SOLN
100.0000 mL | Freq: Once | INTRAMUSCULAR | Status: AC | PRN
  Administered 2011-05-23: 100 mL via INTRAVENOUS

## 2011-05-27 ENCOUNTER — Other Ambulatory Visit: Payer: Self-pay | Admitting: Oncology

## 2011-05-27 ENCOUNTER — Encounter (HOSPITAL_BASED_OUTPATIENT_CLINIC_OR_DEPARTMENT_OTHER): Payer: 59 | Admitting: Oncology

## 2011-05-27 DIAGNOSIS — C8590 Non-Hodgkin lymphoma, unspecified, unspecified site: Secondary | ICD-10-CM

## 2011-05-27 DIAGNOSIS — Z23 Encounter for immunization: Secondary | ICD-10-CM

## 2011-05-27 DIAGNOSIS — C8596 Non-Hodgkin lymphoma, unspecified, intrapelvic lymph nodes: Secondary | ICD-10-CM

## 2011-05-27 LAB — TSH: TSH: 2.071 u[IU]/mL (ref 0.350–4.500)

## 2011-05-28 ENCOUNTER — Other Ambulatory Visit: Payer: Self-pay | Admitting: Oncology

## 2011-05-28 DIAGNOSIS — C8590 Non-Hodgkin lymphoma, unspecified, unspecified site: Secondary | ICD-10-CM

## 2011-05-30 ENCOUNTER — Encounter (HOSPITAL_COMMUNITY)
Admission: RE | Admit: 2011-05-30 | Discharge: 2011-05-30 | Disposition: A | Discharge status: Home / Self Care | Payer: 59 | Source: Ambulatory Visit | Attending: Oncology | Admitting: Oncology

## 2011-05-30 ENCOUNTER — Other Ambulatory Visit: Payer: Self-pay | Admitting: Oncology

## 2011-05-30 ENCOUNTER — Encounter (HOSPITAL_COMMUNITY): Payer: Self-pay

## 2011-05-30 DIAGNOSIS — C8589 Other specified types of non-Hodgkin lymphoma, extranodal and solid organ sites: Secondary | ICD-10-CM | POA: Insufficient documentation

## 2011-05-30 DIAGNOSIS — C8590 Non-Hodgkin lymphoma, unspecified, unspecified site: Secondary | ICD-10-CM

## 2011-05-30 DIAGNOSIS — R599 Enlarged lymph nodes, unspecified: Secondary | ICD-10-CM | POA: Insufficient documentation

## 2011-05-30 HISTORY — DX: Non-Hodgkin lymphoma, unspecified, unspecified site: C85.90

## 2011-05-30 LAB — GLUCOSE, CAPILLARY: Glucose-Capillary: 105 mg/dL — ABNORMAL HIGH (ref 70–99)

## 2011-05-30 MED ORDER — IOHEXOL 300 MG/ML  SOLN
100.0000 mL | Freq: Once | INTRAMUSCULAR | Status: AC | PRN
  Administered 2011-05-30: 100 mL via INTRAVENOUS

## 2011-05-30 MED ORDER — FLUDEOXYGLUCOSE F - 18 (FDG) INJECTION
16.2000 | Freq: Once | INTRAVENOUS | Status: AC | PRN
  Administered 2011-05-30: 16.2 via INTRAVENOUS

## 2011-06-05 LAB — DIFFERENTIAL
Basophils Absolute: 0
Basophils Relative: 1
Lymphocytes Relative: 39
Monocytes Absolute: 0.3
Monocytes Relative: 8
Neutro Abs: 1.7
Neutrophils Relative %: 49

## 2011-06-05 LAB — BASIC METABOLIC PANEL
Calcium: 9.6
Creatinine, Ser: 0.77
GFR calc Af Amer: >60
GFR calc non Af Amer: >60
Sodium: 138

## 2011-06-05 LAB — CBC
Hemoglobin: 12.6
MCHC: 35.3
RBC: 3.9

## 2011-06-05 LAB — POCT CARDIAC MARKERS
CKMB, poc: <1 — ABNORMAL LOW
Myoglobin, poc: 50.6
Troponin i, poc: <0.05

## 2011-06-24 ENCOUNTER — Other Ambulatory Visit: Payer: Self-pay | Admitting: Oncology

## 2011-06-24 ENCOUNTER — Encounter (HOSPITAL_BASED_OUTPATIENT_CLINIC_OR_DEPARTMENT_OTHER): Payer: 59 | Admitting: Oncology

## 2011-06-24 DIAGNOSIS — C8596 Non-Hodgkin lymphoma, unspecified, intrapelvic lymph nodes: Secondary | ICD-10-CM

## 2011-06-24 DIAGNOSIS — Z23 Encounter for immunization: Secondary | ICD-10-CM

## 2011-06-24 LAB — CBC WITH DIFFERENTIAL/PLATELET
EOS%: 5.1 % (ref 0.0–7.0)
HGB: 12.9 g/dL (ref 11.6–15.9)
MCH: 32.4 pg (ref 25.1–34.0)
MCV: 92.5 fL (ref 79.5–101.0)
MONO%: 8.2 % (ref 0.0–14.0)
NEUT#: 1.8 10*3/uL (ref 1.5–6.5)
RBC: 3.97 10*6/uL (ref 3.70–5.45)
RDW: 12.7 % (ref 11.2–14.5)
lymph#: 1.5 10*3/uL (ref 0.9–3.3)

## 2011-06-24 LAB — COMPREHENSIVE METABOLIC PANEL
ALT: 22 U/L (ref 0–35)
AST: 19 U/L (ref 0–37)
Albumin: 4.2 g/dL (ref 3.5–5.2)
Alkaline Phosphatase: 30 U/L — ABNORMAL LOW (ref 39–117)
Calcium: 10 mg/dL (ref 8.4–10.5)
Chloride: 103 mEq/L (ref 96–112)
Potassium: 3.8 mEq/L (ref 3.5–5.3)
Sodium: 142 mEq/L (ref 135–145)
Total Protein: 7.1 g/dL (ref 6.0–8.3)

## 2011-06-24 LAB — PROTHROMBIN TIME: Prothrombin Time: 14.2 seconds (ref 11.6–15.2)

## 2011-06-30 ENCOUNTER — Encounter (HOSPITAL_COMMUNITY)
Admission: RE | Admit: 2011-06-30 | Discharge: 2011-06-30 | Disposition: A | Discharge status: Home / Self Care | Payer: 59 | Source: Ambulatory Visit | Attending: Otolaryngology | Admitting: Otolaryngology

## 2011-06-30 LAB — CBC
HCT: 37.8 % (ref 36.0–46.0)
MCH: 31.7 pg (ref 26.0–34.0)
MCHC: 34.7 g/dL (ref 30.0–36.0)
MCV: 91.5 fL (ref 78.0–100.0)
Platelets: 164 10*3/uL (ref 150–400)
RDW: 12.5 % (ref 11.5–15.5)

## 2011-07-02 ENCOUNTER — Other Ambulatory Visit: Payer: Self-pay | Admitting: Otolaryngology

## 2011-07-02 ENCOUNTER — Ambulatory Visit (HOSPITAL_COMMUNITY)
Admission: RE | Admit: 2011-07-02 | Discharge: 2011-07-02 | Disposition: A | Discharge status: Home / Self Care | Payer: 59 | Source: Ambulatory Visit | Attending: Otolaryngology | Admitting: Otolaryngology

## 2011-07-02 DIAGNOSIS — C8581 Other specified types of non-Hodgkin lymphoma, lymph nodes of head, face, and neck: Secondary | ICD-10-CM | POA: Insufficient documentation

## 2011-07-02 DIAGNOSIS — Z01812 Encounter for preprocedural laboratory examination: Secondary | ICD-10-CM | POA: Insufficient documentation

## 2011-07-02 DIAGNOSIS — Z79899 Other long term (current) drug therapy: Secondary | ICD-10-CM | POA: Insufficient documentation

## 2011-07-14 ENCOUNTER — Encounter (HOSPITAL_BASED_OUTPATIENT_CLINIC_OR_DEPARTMENT_OTHER): Payer: 59 | Admitting: Oncology

## 2011-07-14 ENCOUNTER — Ambulatory Visit
Admission: RE | Admit: 2011-07-14 | Discharge: 2011-07-14 | Disposition: A | Discharge status: Home / Self Care | Payer: 59 | Source: Ambulatory Visit | Attending: Radiation Oncology | Admitting: Radiation Oncology

## 2011-07-14 DIAGNOSIS — C8596 Non-Hodgkin lymphoma, unspecified, intrapelvic lymph nodes: Secondary | ICD-10-CM

## 2011-07-14 DIAGNOSIS — C8581 Other specified types of non-Hodgkin lymphoma, lymph nodes of head, face, and neck: Secondary | ICD-10-CM | POA: Insufficient documentation

## 2011-07-14 DIAGNOSIS — Z510 Encounter for antineoplastic radiation therapy: Secondary | ICD-10-CM | POA: Insufficient documentation

## 2011-07-17 NOTE — Op Note (Signed)
Katelyn Irwin, Katelyn Irwin               ACCOUNT NO.:  [id number]  MEDICAL RECORD NO.:  [id number]  LOCATION:  [facility]                         FACILITY:  [facility]  PHYSICIAN:  Kinnie Scales. Annalee Genta, M.D.DATE OF BIRTH:  1966-08-28  DATE OF PROCEDURE:  07/02/2011 DATE OF DISCHARGE:                              OPERATIVE REPORT   PREOPERATIVE DIAGNOSES: 1. History of lymphoma status post chemotherapy. 2. Left supraclavicular neck mass.  POSTOPERATIVE DIAGNOSES: 1. History of lymphoma status post chemotherapy. 2. Left supraclavicular neck mass.  INDICATIONS FOR SURGERY: 1. History of lymphoma status post chemotherapy. 2. Left supraclavicular neck mass.  SURGICAL PROCEDURE:  Excision of left deep neck mass.  SURGEON:  Kinnie Scales. Annalee Genta, MD  ANESTHESIA:  General/LMA.  COMPLICATIONS:  None.  BLOOD LOSS:  Minimal.  DISPOSITION:  The patient transferred from the operating room to recovery room in stable condition.  BRIEF HISTORY:  The patient is a 45 year old white female with a history of lymphoma involving primarily the pelvic region.  She had undergone extensive workup and treatment through Dr. Cyndie Chime at the [facility].  She had an excellent response to initial therapy. Followup studies including a PET scan showed possible cervical adenopathy in the left supraclavicular fossa.  The patient was asymptomatic and examination revealed a small area of scattered lymph nodes in the left supraclavicular fossa.  Given her history, examination, and physical findings including imaging studies, I recommended he undertake excisional biopsy of left supraclavicular neck mass to rule out recurrent lymphoma.  The risks, benefits, possible complications of procedure were discussed in detail with the patient and her husband.  They understood and concurred with our plan for surgery which is scheduled outpatient under general anesthesia on July 02, 2011.  PROCEDURE:  The patient  was brought to the operating room [facility] Main OR.  General endotracheal anesthesia was established without difficulty.  When the patient adequately anesthetized she was positioned on the operating table, prepped and draped in a sterile fashion.  She was then injected with 1 mL of 1% lidocaine 1:100,000 solution epinephrine injected subcutaneous fashion in the skin along the left supraclavicular fossa.  After allowing adequate time for vasoconstriction hemostasis, a 2-cm horizontally oriented incision was created in the skin with a number 15 scalpel.  This carried through the skin underlying subcutaneous tissue.  The lateralizing platysma muscles identified, I  divided and subplatysmal flaps were elevated.  The anterior border of the PVs muscle was also identified and dissection was carried out in the supraclavicular fossa.  There was a cluster of matted- appearing lymph nodes, which were carefully dissected from the surrounding structures, deep to the area of dissection was the spinal accessory nerve and brachial plexus, which were not traumatized. Several lymph nodes were removed and sent to Pathology for lymphoma workup.  The wound was irrigated and closed in multiple layers beginning with reapproximation of platysma muscle with 5-0 Vicryl suture.  Deep subcutaneous closure with the same stitch in an interrupted fashion and final skin edge closure with Dermabond surgical glue.  The patient was then awakened from anesthetic.  She was extubated and transferred from the operating room to the recovery room in stable  condition.  No complications.  The blood loss was minimal.          ______________________________ Kinnie Scales. Annalee Genta, M.D.     DLS/MEDQ  D:  16/06/9603  T:  07/02/2011  Job:  [id number]  cc:   Levert Feinstein, M.D., F.A.C.P.  Electronically Signed by Osborn Coho M.D. on 07/17/2011 04:52:28 PM

## 2011-07-21 ENCOUNTER — Ambulatory Visit
Admission: RE | Admit: 2011-07-21 | Discharge: 2011-07-21 | Disposition: A | Discharge status: Home / Self Care | Payer: 59 | Source: Ambulatory Visit | Attending: Radiation Oncology | Admitting: Radiation Oncology

## 2011-07-22 ENCOUNTER — Ambulatory Visit
Admission: RE | Admit: 2011-07-22 | Discharge: 2011-07-22 | Disposition: A | Discharge status: Home / Self Care | Payer: 59 | Source: Ambulatory Visit | Attending: Radiation Oncology | Admitting: Radiation Oncology

## 2011-07-23 ENCOUNTER — Ambulatory Visit
Admission: RE | Admit: 2011-07-23 | Discharge: 2011-07-23 | Disposition: A | Discharge status: Home / Self Care | Payer: 59 | Source: Ambulatory Visit | Attending: Radiation Oncology | Admitting: Radiation Oncology

## 2011-07-24 ENCOUNTER — Ambulatory Visit
Admission: RE | Admit: 2011-07-24 | Discharge: 2011-07-24 | Disposition: A | Discharge status: Home / Self Care | Payer: 59 | Source: Ambulatory Visit | Attending: Radiation Oncology | Admitting: Radiation Oncology

## 2011-07-25 ENCOUNTER — Encounter: Payer: Self-pay | Admitting: *Deleted

## 2011-07-25 ENCOUNTER — Ambulatory Visit
Admission: RE | Admit: 2011-07-25 | Discharge: 2011-07-25 | Disposition: A | Discharge status: Home / Self Care | Payer: 59 | Source: Ambulatory Visit | Attending: Radiation Oncology | Admitting: Radiation Oncology

## 2011-07-25 ENCOUNTER — Encounter: Payer: Self-pay | Admitting: Radiation Oncology

## 2011-07-25 VITALS — BP 110/71 | HR 57 | Resp 18 | Wt 145.4 lb

## 2011-07-25 DIAGNOSIS — C801 Malignant (primary) neoplasm, unspecified: Secondary | ICD-10-CM

## 2011-07-25 NOTE — Progress Notes (Signed)
Weekly Radiation Therapy Management  Current Dose: 14  Gy     Planned Dose: 30  Gy  Narrative . . . . . . . .The patient presents for routine under treatment assessment.   Port film x-rays were reviewed.  The chart was checked. The patient reports feeling a tickling in her throat this morning. She denies any discomfort with swallowing. She denies hoarseness. She denies any discomfort with her skin. Physical Findings. . Weight Stable.   Filed Vitals:   07/25/11 1726  BP: 110/71  Pulse: 57  Resp: 18   You did No significant changes. The patient has some mild erythema within the radiation fields but no desquamation. Impression . . . . . . . The patient is  tolerating radiation. Plan . . . . . . . . . .  Continue treatment as planned.
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
## 2011-07-28 ENCOUNTER — Ambulatory Visit
Admission: RE | Admit: 2011-07-28 | Discharge: 2011-07-28 | Disposition: A | Discharge status: Home / Self Care | Payer: 59 | Source: Ambulatory Visit | Attending: Radiation Oncology | Admitting: Radiation Oncology

## 2011-07-29 ENCOUNTER — Ambulatory Visit
Admission: RE | Admit: 2011-07-29 | Discharge: 2011-07-29 | Disposition: A | Discharge status: Home / Self Care | Payer: 59 | Source: Ambulatory Visit | Attending: Radiation Oncology | Admitting: Radiation Oncology

## 2011-07-29 MED ORDER — RADIAPLEXRX EX GEL: Freq: Two times a day (BID) | CUTANEOUS | Status: AC

## 2011-07-29 NOTE — Progress Notes (Signed)
Patient received Radiaplex on 07/25/2011 at 1700. Medication not recorded on [facility] because this writer was awaiting clarification on proper documentation during EPIC go live.

## 2011-07-29 NOTE — Progress Notes (Signed)
Encounter addended by: Ardell Isaacs on: 07/29/2011 10:26 AM<BR>     Documentation filed: Visit Diagnoses, Notes Section, Orders

## 2011-07-30 ENCOUNTER — Ambulatory Visit
Admission: RE | Admit: 2011-07-30 | Discharge: 2011-07-30 | Disposition: A | Discharge status: Home / Self Care | Payer: 59 | Source: Ambulatory Visit | Attending: Radiation Oncology | Admitting: Radiation Oncology

## 2011-07-31 ENCOUNTER — Ambulatory Visit
Admission: RE | Admit: 2011-07-31 | Discharge: 2011-07-31 | Disposition: A | Discharge status: Home / Self Care | Payer: 59 | Source: Ambulatory Visit | Attending: Radiation Oncology | Admitting: Radiation Oncology

## 2011-08-01 ENCOUNTER — Ambulatory Visit
Admission: RE | Admit: 2011-08-01 | Discharge: 2011-08-01 | Disposition: A | Discharge status: Home / Self Care | Payer: 59 | Source: Ambulatory Visit | Attending: Radiation Oncology | Admitting: Radiation Oncology

## 2011-08-01 ENCOUNTER — Encounter: Payer: Self-pay | Admitting: Radiation Oncology

## 2011-08-01 VITALS — BP 112/74 | HR 67 | Resp 18 | Wt 148.6 lb

## 2011-08-01 DIAGNOSIS — C801 Malignant (primary) neoplasm, unspecified: Secondary | ICD-10-CM

## 2011-08-01 NOTE — Progress Notes (Signed)
  Radiation Oncology         [phone] ________________________________  Name: Katelyn Irwin MRN: [id number]  Date: 08/01/2011  DOB: Feb 24, 1966  Weekly Radiation Therapy Management  Current Dose: 24 Gy     Planned Dose:  30 Gy  Narrative . . . . . . . . The patient presents for routine under treatment assessment.      The patient does have an upper respiratory infection. She was given antibiotics by her primary care physician for this. She denies any esophageal symptoms from her neck exposure.                                 Set-up films were reviewed.                                 The chart was checked. Physical Findings. . . Weight essentially stable.  No significant changes. The patient's neck is notable for erythema but no desquamation. Impression . . . . . . . The patient is  tolerating radiation. Plan . . . . . . . . . . . . Continue treatment as planned.  ________________________________  Artist Pais. Kathrynn Running, M.D.

## 2011-08-01 NOTE — Progress Notes (Signed)
Patient presents to the clinic today accompanied by her husband for under treat visit with Dr. Mitzi Hansen. No distress noted. Steady gait noted. Pleasant affect noted. Patient denies pain. Patient reports she was prescribed Cefprozil for an upper respiratory infection. Patient reports she began taking the medication on Wednesday. Patient reports a dry cough. Patient denies nausea, vomiting, diarrhea, or constipation. Patient denies any other complaints. Patient eating without difficulty. Patient denies esophagitis.

## 2011-08-02 ENCOUNTER — Ambulatory Visit
Admission: RE | Admit: 2011-08-02 | Discharge: 2011-08-02 | Disposition: A | Discharge status: Home / Self Care | Payer: 59 | Source: Ambulatory Visit | Attending: Radiation Oncology | Admitting: Radiation Oncology

## 2011-08-04 ENCOUNTER — Ambulatory Visit
Admission: RE | Admit: 2011-08-04 | Discharge: 2011-08-04 | Disposition: A | Discharge status: Home / Self Care | Payer: 59 | Source: Ambulatory Visit | Attending: Radiation Oncology | Admitting: Radiation Oncology

## 2011-08-05 ENCOUNTER — Ambulatory Visit
Admission: RE | Admit: 2011-08-05 | Discharge: 2011-08-05 | Disposition: A | Discharge status: Home / Self Care | Payer: 59 | Source: Ambulatory Visit | Attending: Radiation Oncology | Admitting: Radiation Oncology

## 2011-08-05 VITALS — Wt 146.7 lb

## 2011-08-05 DIAGNOSIS — C801 Malignant (primary) neoplasm, unspecified: Secondary | ICD-10-CM

## 2011-08-05 NOTE — Progress Notes (Signed)
  Radiation Oncology         [phone] ________________________________  Name: Katelyn Irwin MRN: [id number]  Date: 08/05/2011  DOB: 25-Apr-1966  Weekly Radiation Therapy Management  Current Dose: 30 Gy     Planned Dose:  30 Gy  Narrative . . . . . . . . The patient presents for routine under treatment assessment. She notes some difficulty swallowing solid foods. However, this has not affected her diet. She feels that the food gets caught midway down her throat                                Set-up films were reviewed.                                 The chart was checked. Physical Findings. . . Weight essentially stable.  No significant changes.. The patient has erythema along the left neck within the radiation portals. There is no desquamation. There is no palpable lymphadenopathy. Impression . . . . . . . The patient is  tolerating radiation. Plan . . . . . . . . . . . Marland Kitchen complete treatment as planned. The patient will return for routine followup in one month.  ________________________________  Artist Pais. Kathrynn Running, M.D.

## 2011-08-05 NOTE — Progress Notes (Signed)
Pt completed today, has fu card. Pt states some fatigue at end of day, little pain in throat but has not affected her eating. Slight dryness of mouth, mucus slightly thick. Pt applying radiaplex to skin; advised she cont w/lotion x 2-3 weeks.

## 2011-08-14 ENCOUNTER — Encounter: Payer: Self-pay | Admitting: Oncology

## 2011-08-14 ENCOUNTER — Other Ambulatory Visit: Payer: Self-pay | Admitting: Oncology

## 2011-08-14 DIAGNOSIS — C8510 Unspecified B-cell lymphoma, unspecified site: Secondary | ICD-10-CM | POA: Insufficient documentation

## 2011-08-14 DIAGNOSIS — C8590 Non-Hodgkin lymphoma, unspecified, unspecified site: Secondary | ICD-10-CM | POA: Insufficient documentation

## 2011-08-14 HISTORY — DX: Non-Hodgkin lymphoma, unspecified, unspecified site: C85.90

## 2011-08-14 HISTORY — DX: Unspecified B-cell lymphoma, unspecified site: C85.10

## 2011-08-15 ENCOUNTER — Other Ambulatory Visit: Payer: Self-pay | Admitting: Oncology

## 2011-08-15 ENCOUNTER — Other Ambulatory Visit (HOSPITAL_BASED_OUTPATIENT_CLINIC_OR_DEPARTMENT_OTHER): Payer: 59 | Admitting: Lab

## 2011-08-15 ENCOUNTER — Ambulatory Visit (HOSPITAL_BASED_OUTPATIENT_CLINIC_OR_DEPARTMENT_OTHER): Payer: 59

## 2011-08-15 DIAGNOSIS — C8581 Other specified types of non-Hodgkin lymphoma, lymph nodes of head, face, and neck: Secondary | ICD-10-CM

## 2011-08-15 DIAGNOSIS — C8590 Non-Hodgkin lymphoma, unspecified, unspecified site: Secondary | ICD-10-CM

## 2011-08-15 DIAGNOSIS — Z5112 Encounter for antineoplastic immunotherapy: Secondary | ICD-10-CM

## 2011-08-15 DIAGNOSIS — C8510 Unspecified B-cell lymphoma, unspecified site: Secondary | ICD-10-CM

## 2011-08-15 LAB — CBC WITH DIFFERENTIAL/PLATELET
Basophils Absolute: 0 10*3/uL (ref 0.0–0.1)
EOS%: 4.6 % (ref 0.0–7.0)
Eosinophils Absolute: 0.2 10*3/uL (ref 0.0–0.5)
HGB: 12.3 g/dL (ref 11.6–15.9)
LYMPH%: 25.4 % (ref 14.0–49.7)
MCH: 30.9 pg (ref 25.1–34.0)
MCV: 91.2 fL (ref 79.5–101.0)
MONO%: 7.5 % (ref 0.0–14.0)
NEUT#: 2.1 10*3/uL (ref 1.5–6.5)
NEUT%: 61.9 % (ref 38.4–76.8)
Platelets: 175 10*3/uL (ref 145–400)

## 2011-08-15 MED ORDER — SODIUM CHLORIDE 0.9 % IV SOLN
375.0000 mg/m2 | Freq: Once | INTRAVENOUS | Status: AC
  Administered 2011-08-15: 700 mg via INTRAVENOUS
  Filled 2011-08-15: qty 70

## 2011-08-15 MED ORDER — DIPHENHYDRAMINE HCL 25 MG PO CAPS
50.0000 mg | ORAL_CAPSULE | Freq: Once | ORAL | Status: AC
  Administered 2011-08-15: 50 mg via ORAL

## 2011-08-15 MED ORDER — ACETAMINOPHEN 325 MG PO TABS
650.0000 mg | ORAL_TABLET | Freq: Once | ORAL | Status: AC
  Administered 2011-08-15: 650 mg via ORAL

## 2011-08-17 ENCOUNTER — Encounter: Payer: Self-pay | Admitting: Radiation Oncology

## 2011-08-17 NOTE — Progress Notes (Signed)
[facility] Radiation Oncology  Name: Katelyn Irwin MRN: [id number]  Date: 08/17/2011  DOB: 1966-03-29  Status:outpatient    CC: Cephas Darby. MD  REFERRING PHYSICIAN:  Cephas Darby. 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   INDICATION FOR TREATMENT: Curative  TREATMENT DATES: 07/17/2011 through 08/05/2011  SITE/DOSE: The patient's left neck received 30 gray in 15 fractions of 2 gray  BEAMS/ENERGY: Anterior and posterior 6 megavolt photon fields were designed to cover the patient's left neck and supraclavicular lymph node region. The patient was positioned daily with a thermoplastic mask. Port films were obtained once per week to ensure proper setup and positioning.  NARRATIVE: [name] tolerated radiotherapy without any complications. She developed some erythema of the left neck. She also noted minimal discomfort with swallowing.  PLAN: Routine followup in one month. Patient instructed to call if questions or worsening complaints in interim.  ------------------------------------------------  Artist Pais Kathrynn Running, M.D.

## 2011-08-17 NOTE — Progress Notes (Signed)
[facility] Radiation Oncology Simulation Verification Note   Name: Katelyn Irwin MRN: [id number]  Date: 07/17/2011  DOB: 06-13-1966  Status:outpatient   POSITION: Patient is positioned supine and  Isocenter was reviewed.. Treatment was approved. The [facility] was reviewed  NARRATIVE: The patient will proceed with treatment as planned

## 2011-08-22 ENCOUNTER — Ambulatory Visit (HOSPITAL_BASED_OUTPATIENT_CLINIC_OR_DEPARTMENT_OTHER): Payer: 59

## 2011-08-22 ENCOUNTER — Other Ambulatory Visit (HOSPITAL_BASED_OUTPATIENT_CLINIC_OR_DEPARTMENT_OTHER): Payer: 59 | Admitting: Lab

## 2011-08-22 ENCOUNTER — Other Ambulatory Visit: Payer: Self-pay | Admitting: Oncology

## 2011-08-22 VITALS — BP 98/60 | HR 60 | Temp 98.3°F | Ht 66.5 in | Wt 150.5 lb

## 2011-08-22 DIAGNOSIS — C8590 Non-Hodgkin lymphoma, unspecified, unspecified site: Secondary | ICD-10-CM

## 2011-08-22 DIAGNOSIS — C8581 Other specified types of non-Hodgkin lymphoma, lymph nodes of head, face, and neck: Secondary | ICD-10-CM

## 2011-08-22 DIAGNOSIS — C8510 Unspecified B-cell lymphoma, unspecified site: Secondary | ICD-10-CM

## 2011-08-22 DIAGNOSIS — Z5112 Encounter for antineoplastic immunotherapy: Secondary | ICD-10-CM

## 2011-08-22 LAB — CBC WITH DIFFERENTIAL/PLATELET
BASO%: 0.9 % (ref 0.0–2.0)
EOS%: 5.5 % (ref 0.0–7.0)
Eosinophils Absolute: 0.2 10*3/uL (ref 0.0–0.5)
LYMPH%: 23.7 % (ref 14.0–49.7)
MCH: 31.5 pg (ref 25.1–34.0)
MCHC: 34.9 g/dL (ref 31.5–36.0)
MCV: 90.1 fL (ref 79.5–101.0)
MONO%: 8.8 % (ref 0.0–14.0)
Platelets: 194 10*3/uL (ref 145–400)
RBC: 4.13 10*6/uL (ref 3.70–5.45)
RDW: 13 % (ref 11.2–14.5)
nRBC: 0 % (ref 0–0)

## 2011-08-22 LAB — LACTATE DEHYDROGENASE: LDH: 162 U/L (ref 94–250)

## 2011-08-22 LAB — COMPREHENSIVE METABOLIC PANEL
ALT: 43 U/L — ABNORMAL HIGH (ref 0–35)
Alkaline Phosphatase: 29 U/L — ABNORMAL LOW (ref 39–117)
CO2: 29 mEq/L (ref 19–32)
Creatinine, Ser: 0.74 mg/dL (ref 0.50–1.10)
Total Bilirubin: 1.4 mg/dL — ABNORMAL HIGH (ref 0.3–1.2)

## 2011-08-22 MED ORDER — SODIUM CHLORIDE 0.9 % IV SOLN
375.0000 mg/m2 | Freq: Once | INTRAVENOUS | Status: AC
  Administered 2011-08-22: 700 mg via INTRAVENOUS
  Filled 2011-08-22: qty 70

## 2011-08-22 MED ORDER — ACETAMINOPHEN 325 MG PO TABS
650.0000 mg | ORAL_TABLET | Freq: Once | ORAL | Status: AC
  Administered 2011-08-22: 650 mg via ORAL

## 2011-08-22 MED ORDER — SODIUM CHLORIDE 0.9 % IV SOLN
Freq: Once | INTRAVENOUS | Status: AC
  Administered 2011-08-22: 09:00:00 via INTRAVENOUS

## 2011-08-22 MED ORDER — DIPHENHYDRAMINE HCL 25 MG PO CAPS
50.0000 mg | ORAL_CAPSULE | Freq: Once | ORAL | Status: AC
  Administered 2011-08-22: 50 mg via ORAL

## 2011-08-22 MED ORDER — SODIUM CHLORIDE 0.9 % IV SOLN: 375.0000 mg/m2 | Freq: Once | INTRAVENOUS | Status: DC

## 2011-08-22 NOTE — Patient Instructions (Signed)
1145 Pt verbalized understanding of next appt date/time and to call if problems

## 2011-08-29 ENCOUNTER — Other Ambulatory Visit: Payer: Self-pay | Admitting: *Deleted

## 2011-08-29 ENCOUNTER — Ambulatory Visit (HOSPITAL_BASED_OUTPATIENT_CLINIC_OR_DEPARTMENT_OTHER): Payer: 59

## 2011-08-29 ENCOUNTER — Ambulatory Visit (HOSPITAL_BASED_OUTPATIENT_CLINIC_OR_DEPARTMENT_OTHER): Payer: 59 | Admitting: Oncology

## 2011-08-29 ENCOUNTER — Other Ambulatory Visit (HOSPITAL_BASED_OUTPATIENT_CLINIC_OR_DEPARTMENT_OTHER): Payer: 59 | Admitting: Lab

## 2011-08-29 ENCOUNTER — Telehealth: Payer: Self-pay | Admitting: Oncology

## 2011-08-29 VITALS — BP 105/74 | HR 54 | Temp 97.9°F | Ht 66.5 in | Wt 148.4 lb

## 2011-08-29 DIAGNOSIS — C8581 Other specified types of non-Hodgkin lymphoma, lymph nodes of head, face, and neck: Secondary | ICD-10-CM

## 2011-08-29 DIAGNOSIS — C8510 Unspecified B-cell lymphoma, unspecified site: Secondary | ICD-10-CM

## 2011-08-29 DIAGNOSIS — C8589 Other specified types of non-Hodgkin lymphoma, extranodal and solid organ sites: Secondary | ICD-10-CM

## 2011-08-29 DIAGNOSIS — Z5112 Encounter for antineoplastic immunotherapy: Secondary | ICD-10-CM

## 2011-08-29 LAB — CBC WITH DIFFERENTIAL/PLATELET
Basophils Absolute: 0 10*3/uL (ref 0.0–0.1)
HCT: 36.7 % (ref 34.8–46.6)
HGB: 12.8 g/dL (ref 11.6–15.9)
MCH: 31.1 pg (ref 25.1–34.0)
MONO#: 0.4 10*3/uL (ref 0.1–0.9)
NEUT%: 59.6 % (ref 38.4–76.8)
Platelets: 192 10*3/uL (ref 145–400)
WBC: 3.3 10*3/uL — ABNORMAL LOW (ref 3.9–10.3)
lymph#: 0.8 10*3/uL — ABNORMAL LOW (ref 0.9–3.3)

## 2011-08-29 MED ORDER — ACETAMINOPHEN 325 MG PO TABS
650.0000 mg | ORAL_TABLET | Freq: Once | ORAL | Status: AC
  Administered 2011-08-29: 650 mg via ORAL

## 2011-08-29 MED ORDER — SODIUM CHLORIDE 0.9 % IV SOLN
375.0000 mg/m2 | Freq: Once | INTRAVENOUS | Status: AC
  Administered 2011-08-29: 700 mg via INTRAVENOUS
  Filled 2011-08-29: qty 70

## 2011-08-29 MED ORDER — DIPHENHYDRAMINE HCL 25 MG PO CAPS
50.0000 mg | ORAL_CAPSULE | Freq: Once | ORAL | Status: AC
  Administered 2011-08-29: 50 mg via ORAL

## 2011-08-29 MED ORDER — SODIUM CHLORIDE 0.9 % IV SOLN
Freq: Once | INTRAVENOUS | Status: AC
  Administered 2011-08-29: 12:00:00 via INTRAVENOUS

## 2011-08-29 MED ORDER — SODIUM CHLORIDE 0.9 % IV SOLN: 375.0000 mg/m2 | Freq: Once | INTRAVENOUS | Status: DC

## 2011-08-29 NOTE — Telephone Encounter (Signed)
gve the pt's husband the feb-aug 2013 appt calendar along with the ct scan appt. Pt wants to do the water based oral contrast

## 2011-08-29 NOTE — Progress Notes (Signed)
Mrs. Westergard completed 30 grays of involved field radiation to the left supraclavicular area by Dr. Margaretmary Dys without any complications. She started on weekly Rituxan treatments and has had 2 with a third treatment plan for today and a fourth treatment next week. She will then go on a maintenance program with 1 dose every 2 months for 4 additional treatments. She is in good spirits today. She's had no interim problems. She had a mild skin reaction at the mid course of the radiation which has subsequently resolved. Review of systems otherwise unremarkable.  On exam head and neck are normal scar in the left supraclavicular area from recent lymph node biopsy. No palpable lymph nodes at this time in the neck cervical clavicular or axillary or inguinal regions. Pharynx no erythema exudate or ulcer lungs are clear and resonant to percussion regular cardiac rhythm no murmur abdomen soft nontender no mass no organomegaly extremities no edema no calf tenderness neurologic motor strength 5 over 5 reflexes 1+ symmetric.  Impression: #1. Initial high-grade B cell non-Hodgkin's lymphoma presenting with bulky pelvic lymphadenopathy additional areas of non-both the adenopathy in the mediastinum retroperitoneum and right neck. Complete remission with 6 cycles of CHOP-Rituxan. Initial diagnosis January 2010.  #2. Low-grade B-cell non-Hodgkin's lymphoma. Appearance of a cluster of small subcentimeter nodes in the left supraclavicular region on a routine followup CT scan 05/27/11. Low-grade activity on PET scan obscured by underlying brown fat. Similar area of brown fat uptake on the right. Excisional lymph node biopsy revealed low-grade B-cell non-Hodgkin's lymphoma. Recent completion of involved field radiation now on Rituxan maintenance.  Plan continue close clinical and radiographic followup. I will repeat scans in early February.  Cc: Dr Donna Bernard Dr. Margaretmary Dys Dr. Osborn Coho Dr. Malva Limes

## 2011-08-29 NOTE — Progress Notes (Signed)
Rituxan started at 167mls/hr X 

## 2011-08-29 NOTE — Progress Notes (Signed)
Rituxan complete. Pt tolerated well

## 2011-08-29 NOTE — Progress Notes (Signed)
VSS, pt tolerating Rituxan without difficulty. Rate increased to 234mls/hr X 

## 2011-08-30 ENCOUNTER — Other Ambulatory Visit: Payer: Self-pay | Admitting: Oncology

## 2011-09-05 ENCOUNTER — Ambulatory Visit (HOSPITAL_BASED_OUTPATIENT_CLINIC_OR_DEPARTMENT_OTHER): Payer: 59

## 2011-09-05 ENCOUNTER — Other Ambulatory Visit: Payer: 59 | Admitting: Lab

## 2011-09-05 DIAGNOSIS — C8581 Other specified types of non-Hodgkin lymphoma, lymph nodes of head, face, and neck: Secondary | ICD-10-CM

## 2011-09-05 DIAGNOSIS — C8590 Non-Hodgkin lymphoma, unspecified, unspecified site: Secondary | ICD-10-CM

## 2011-09-05 DIAGNOSIS — C8510 Unspecified B-cell lymphoma, unspecified site: Secondary | ICD-10-CM

## 2011-09-05 DIAGNOSIS — Z5112 Encounter for antineoplastic immunotherapy: Secondary | ICD-10-CM

## 2011-09-05 MED ORDER — ACETAMINOPHEN 325 MG PO TABS
650.0000 mg | ORAL_TABLET | Freq: Once | ORAL | Status: AC
  Administered 2011-09-05: 650 mg via ORAL

## 2011-09-05 MED ORDER — SODIUM CHLORIDE 0.9 % IV SOLN
375.0000 mg/m2 | Freq: Once | INTRAVENOUS | Status: AC
  Administered 2011-09-05: 700 mg via INTRAVENOUS
  Filled 2011-09-05: qty 70

## 2011-09-05 MED ORDER — DIPHENHYDRAMINE HCL 25 MG PO CAPS
50.0000 mg | ORAL_CAPSULE | Freq: Once | ORAL | Status: AC
  Administered 2011-09-05: 50 mg via ORAL

## 2011-09-05 MED ORDER — SODIUM CHLORIDE 0.9 % IV SOLN: 375.0000 mg/m2 | Freq: Once | INTRAVENOUS | Status: DC

## 2011-09-05 MED ORDER — SODIUM CHLORIDE 0.9 % IV SOLN
Freq: Once | INTRAVENOUS | Status: AC
  Administered 2011-09-05: 09:00:00 via INTRAVENOUS

## 2011-09-05 NOTE — Patient Instructions (Signed)
09/05/11 1145-Pt discharged ambulatory with next appointment confirmed.  Pt aware to call with any questions or concerns.

## 2011-09-08 ENCOUNTER — Encounter: Payer: Self-pay | Admitting: *Deleted

## 2011-09-08 NOTE — Progress Notes (Addendum)
Follow up Left Neck low grade follicular B-cell Lymphoma Radiation Therapy 07/17/11-08/05/11  Rituxan therapy weekly 3rd one 08/29/11,(6 cycles CHOP)   Allergies:Nkda   Married,3 sons

## 2011-09-18 ENCOUNTER — Encounter: Payer: Self-pay | Admitting: Radiation Oncology

## 2011-09-18 ENCOUNTER — Ambulatory Visit
Admission: RE | Admit: 2011-09-18 | Discharge: 2011-09-18 | Disposition: A | Discharge status: Home / Self Care | Payer: 59 | Source: Ambulatory Visit | Attending: Radiation Oncology | Admitting: Radiation Oncology

## 2011-09-18 VITALS — Wt 146.9 lb

## 2011-09-18 DIAGNOSIS — C8510 Unspecified B-cell lymphoma, unspecified site: Secondary | ICD-10-CM

## 2011-09-18 NOTE — Progress Notes (Signed)
  Radiation Oncology         [phone] ________________________________  Name: Katelyn Irwin MRN: [id number]  Date: 09/18/2011  DOB: 08/09/1966       Follow-Up Visit Note  CC: Levert Feinstein, M.D., F.A.C.P.   REQUESTING PHYSICIAN: Levert Feinstein, M.D., F.A.C.P.  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  Interval Since Last Radiation:  1 months  Narrative . . . . . . . . The patient returns today for routine follow-up.  She is without complaint.  Receiving Rituxan.   No odynophagia/dysphagia.                              ALLERGIES:   has no known allergies.  Meds. . . . . . . . . . . . Current Outpatient Prescriptions  Medication Sig Dispense Refill  . azithromycin (ZITHROMAX) 1 G powder Take 1 packet by mouth once.        . Calcium Carbonate-Vitamin D (CALCIUM-VITAMIN D) 500-200 MG-UNIT per tablet Take 1 tablet by mouth 2 (two) times daily with a meal.        . Multiple Vitamin (MULTIVITAMIN) tablet Take 1 tablet by mouth daily.        Marland Kitchen omega-3 acid ethyl esters (LOVAZA) 1 G capsule Take 2 g by mouth 2 (two) times daily.         No current facility-administered medications for this encounter.   Facility-Administered Medications Ordered in Other Encounters  Medication Dose Route Frequency Provider Last Rate Last Dose  . hyaluronate sodium (RADIAPLEXRX) gel   Topical BID Oneita Hurt, MD        Physical Findings. . .  Left neck shows minimal erythema.  No desquamation.  Some epilation along posterior left scalp.  Lab Findings . . . . .  Lab Results  Component Value Date   WBC 3.3* 08/29/2011   HGB 12.8 08/29/2011   HCT 36.7 08/29/2011   MCV 89.3 08/29/2011   PLT 192 08/29/2011   Impression . . . . . . . The patient is recovering from the effects of radiation.  Plan . . . . . . . . . . . . Follow-up in one year, for annual post-radiation TSH, if not already  done.  _____________________________________  Artist Pais. Kathrynn Running, M.D.

## 2011-09-18 NOTE — Progress Notes (Signed)
Pt here f/u left neck, slight dry mouth, eating well and drinking fluids, no pain, no scans until Feb 2013,   Radiation therapy 07/17/2011-08/05/11  Married, 3 children,  Allergies:nkda

## 2011-10-12 ENCOUNTER — Other Ambulatory Visit: Payer: Self-pay | Admitting: Oncology

## 2011-10-12 DIAGNOSIS — C8510 Unspecified B-cell lymphoma, unspecified site: Secondary | ICD-10-CM

## 2011-10-13 ENCOUNTER — Telehealth: Payer: Self-pay | Admitting: Oncology

## 2011-10-13 NOTE — Telephone Encounter (Signed)
Called pt, reminded pt of appt on 2/15 and requested her to get calendar

## 2011-10-19 ENCOUNTER — Emergency Department (INDEPENDENT_AMBULATORY_CARE_PROVIDER_SITE_OTHER)
Admission: EM | Admit: 2011-10-19 | Discharge: 2011-10-19 | Disposition: A | Discharge status: Home / Self Care | Payer: 59 | Source: Home / Self Care

## 2011-10-19 ENCOUNTER — Encounter (HOSPITAL_COMMUNITY): Payer: Self-pay

## 2011-10-19 DIAGNOSIS — H109 Unspecified conjunctivitis: Secondary | ICD-10-CM

## 2011-10-19 MED ORDER — POLYMYXIN B-TRIMETHOPRIM 10000-0.1 UNIT/ML-% OP SOLN: 2.0000 [drp] | Freq: Three times a day (TID) | OPHTHALMIC | Status: AC

## 2011-10-19 NOTE — ED Provider Notes (Signed)
History     CSN: [id number]  Arrival date & time 10/19/11  1057   None     Chief Complaint  Patient presents with  . 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 She awoke this morning with her right eyelid swollen and goopy, but not crusted shut. 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 She denies any recent URI symptoms or fever. She is concerned that she is developing pink eye and wants to begin treatment as soon as possible, before returning to school tomorrow morning.    Past Medical History  Diagnosis Date  . Lymphoma   . Cancer     b-cell lymphoma  . Pyloric stenosis, congenital     surgical repair as infant  . 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 Lymphoma     Past Surgical History  Procedure Date  . Mandible reconstruction     as child, age 32  . Pyloric stenosis surgery     as infant    History reviewed. No pertinent family history.  History  Substance Use Topics  . Smoking status: Never Smoker   . Smokeless tobacco: Not on file  . Alcohol Use: No    OB History    Grav Para Term Preterm Abortions TAB SAB Ect Mult Living                  Review of Systems  Constitutional: Negative for fever and chills.  HENT: Negative for ear pain, sore throat, rhinorrhea, sneezing and postnasal drip.   Eyes: Positive for discharge and redness. Negative for photophobia, pain and itching.  Respiratory: Negative for cough.   Cardiovascular: Negative for chest pain and palpitations.    Allergies  Review of patient's allergies indicates no known allergies.  Home Medications   Current Outpatient Rx  Name Route Sig Dispense Refill  . CALCIUM-VITAMIN D 500-200 MG-UNIT PO TABS Oral Take 1 tablet by mouth 2 (two) times daily with a meal.      . ONE-DAILY MULTI VITAMINS PO TABS Oral Take 1  tablet by mouth daily.      . OMEGA-3-ACID ETHYL ESTERS 1 G PO CAPS Oral Take 2 g by mouth 2 (two) times daily.      Marland Kitchen POLYMYXIN B-TRIMETHOPRIM 10000-0.1 UNIT/ML-% OP SOLN Right Eye Place 2 drops into the right eye 3 (three) times daily. 10 mL 0    BP 108/70  Pulse 61  Temp(Src) 98.2 F (36.8 C) (Oral)  Resp 14  SpO2 100%  Physical Exam  Nursing note and vitals reviewed. Constitutional: She appears well-developed and well-nourished. No distress.  HENT:  Head: Normocephalic and atraumatic.  Right Ear: Tympanic membrane, external ear and ear canal normal.  Left Ear: Tympanic membrane, external ear and ear canal normal.  Nose: Nose normal.  Mouth/Throat: Uvula is midline, oropharynx is clear and moist and mucous membranes are normal. No oropharyngeal exudate, posterior oropharyngeal edema or posterior oropharyngeal erythema.  Eyes: Conjunctivae and EOM are normal. Pupils are equal, round, and reactive to light. Right eye exhibits no discharge, no exudate and no hordeolum. No foreign body present in the right eye. Left eye exhibits no discharge, no exudate and no hordeolum. No foreign body present in the left eye.    Neck: Neck supple.  Cardiovascular: Normal rate, regular rhythm and normal heart sounds.   Pulmonary/Chest: Effort normal and breath sounds normal. No respiratory  distress.  Lymphadenopathy:    She has no cervical adenopathy.  Neurological: She is alert.  Skin: Skin is warm and dry.  Psychiatric: She has a normal mood and affect.    ED Course  Procedures (including critical care time)  Labs Reviewed - No data to display No results found.   1. Conjunctivitis       MDM          Melody Comas, PA 10/19/11 1225

## 2011-10-19 NOTE — ED Notes (Signed)
Pt woke this am with rt eye edema and drainage, swelling has decreased and continues to have redness around upper lid at lash line.

## 2011-10-23 ENCOUNTER — Ambulatory Visit (HOSPITAL_COMMUNITY)
Admission: RE | Admit: 2011-10-23 | Discharge: 2011-10-23 | Disposition: A | Discharge status: Home / Self Care | Payer: 59 | Source: Ambulatory Visit | Attending: Oncology | Admitting: Oncology

## 2011-10-23 ENCOUNTER — Encounter (HOSPITAL_COMMUNITY): Payer: Self-pay

## 2011-10-23 ENCOUNTER — Other Ambulatory Visit: Payer: Self-pay | Admitting: *Deleted

## 2011-10-23 DIAGNOSIS — C8590 Non-Hodgkin lymphoma, unspecified, unspecified site: Secondary | ICD-10-CM

## 2011-10-23 DIAGNOSIS — C8589 Other specified types of non-Hodgkin lymphoma, extranodal and solid organ sites: Secondary | ICD-10-CM | POA: Insufficient documentation

## 2011-10-23 DIAGNOSIS — C8510 Unspecified B-cell lymphoma, unspecified site: Secondary | ICD-10-CM

## 2011-10-23 MED ORDER — IOHEXOL 300 MG/ML  SOLN
125.0000 mL | Freq: Once | INTRAMUSCULAR | Status: AC | PRN
  Administered 2011-10-23: 125 mL via INTRAVENOUS

## 2011-10-24 ENCOUNTER — Other Ambulatory Visit (HOSPITAL_COMMUNITY): Payer: 59

## 2011-10-27 ENCOUNTER — Telehealth: Payer: Self-pay

## 2011-10-27 NOTE — Telephone Encounter (Signed)
Message copied by Albertha Ghee on Mon Oct 27, 2011  5:00 PM ------      Message from: Levert Feinstein      Created: Sun Oct 26, 2011 11:09 AM       Call pt  CT neg for progression of lymphoma

## 2011-10-27 NOTE — Telephone Encounter (Signed)
Pt notified by phone per Dr Cyndie Chime "CT negative for progression." dph

## 2011-10-27 NOTE — ED Provider Notes (Signed)
Medical screening examination/treatment/procedure(s) were performed by non-physician practitioner and as supervising physician I was immediately available for consultation/collaboration.  LANEY,RONNIE   Ronnie Laney, MD 10/27/11 1435 

## 2011-10-31 ENCOUNTER — Telehealth: Payer: Self-pay | Admitting: Oncology

## 2011-10-31 ENCOUNTER — Ambulatory Visit: Payer: 59 | Admitting: Oncology

## 2011-10-31 ENCOUNTER — Other Ambulatory Visit: Payer: 59 | Admitting: Lab

## 2011-10-31 ENCOUNTER — Ambulatory Visit (HOSPITAL_BASED_OUTPATIENT_CLINIC_OR_DEPARTMENT_OTHER): Payer: 59

## 2011-10-31 VITALS — BP 102/66 | HR 54

## 2011-10-31 VITALS — BP 110/71 | HR 56 | Temp 97.1°F | Ht 66.5 in | Wt 148.8 lb

## 2011-10-31 DIAGNOSIS — C8510 Unspecified B-cell lymphoma, unspecified site: Secondary | ICD-10-CM

## 2011-10-31 DIAGNOSIS — C8590 Non-Hodgkin lymphoma, unspecified, unspecified site: Secondary | ICD-10-CM

## 2011-10-31 DIAGNOSIS — C8588 Other specified types of non-Hodgkin lymphoma, lymph nodes of multiple sites: Secondary | ICD-10-CM

## 2011-10-31 DIAGNOSIS — Z5112 Encounter for antineoplastic immunotherapy: Secondary | ICD-10-CM

## 2011-10-31 LAB — CBC WITH DIFFERENTIAL/PLATELET
EOS%: 7 % (ref 0.0–7.0)
Eosinophils Absolute: 0.2 10*3/uL (ref 0.0–0.5)
MCV: 90 fL (ref 79.5–101.0)
MONO%: 10.3 % (ref 0.0–14.0)
NEUT#: 1.5 10*3/uL (ref 1.5–6.5)
RBC: 4.11 10*6/uL (ref 3.70–5.45)
RDW: 13 % (ref 11.2–14.5)
lymph#: 0.8 10*3/uL — ABNORMAL LOW (ref 0.9–3.3)
nRBC: 0 % (ref 0–0)

## 2011-10-31 LAB — COMPREHENSIVE METABOLIC PANEL
ALT: 24 U/L (ref 0–35)
Alkaline Phosphatase: 22 U/L — ABNORMAL LOW (ref 39–117)
CO2: 27 mEq/L (ref 19–32)
Potassium: 4.2 mEq/L (ref 3.5–5.3)
Sodium: 141 mEq/L (ref 135–145)
Total Bilirubin: 1.7 mg/dL — ABNORMAL HIGH (ref 0.3–1.2)
Total Protein: 7 g/dL (ref 6.0–8.3)

## 2011-10-31 MED ORDER — ACETAMINOPHEN 325 MG PO TABS
650.0000 mg | ORAL_TABLET | Freq: Once | ORAL | Status: AC
  Administered 2011-10-31: 650 mg via ORAL

## 2011-10-31 MED ORDER — SODIUM CHLORIDE 0.9 % IV SOLN
375.0000 mg/m2 | Freq: Once | INTRAVENOUS | Status: AC
  Administered 2011-10-31: 700 mg via INTRAVENOUS
  Filled 2011-10-31: qty 70

## 2011-10-31 MED ORDER — SODIUM CHLORIDE 0.9 % IV SOLN: 375.0000 mg/m2 | Freq: Once | INTRAVENOUS | Status: DC

## 2011-10-31 MED ORDER — SODIUM CHLORIDE 0.9 % IV SOLN
Freq: Once | INTRAVENOUS | Status: AC
  Administered 2011-10-31: 12:00:00 via INTRAVENOUS

## 2011-10-31 MED ORDER — DIPHENHYDRAMINE HCL 25 MG PO CAPS
50.0000 mg | ORAL_CAPSULE | Freq: Once | ORAL | Status: AC
  Administered 2011-10-31: 50 mg via ORAL

## 2011-10-31 NOTE — Progress Notes (Signed)
Hematology and Oncology Follow Up Visit  KNOX HOLDMAN [id number] 12-11-1965 46 y.o. 10/31/2011 10:56 AM   Principle Diagnosis: Encounter Diagnoses  Name Primary?  . Lymphoma   . Low grade B-cell lymphoma Yes     Interim History:   Followup visit for this pleasant 46 year old woman initially diagnosed with high-grade B-cell non-Hodgkin's lymphoma in January 2010 when she presented with pelvic pain and was found to have bulky pelvic and iliac lymphadenopathy. She was treated with CHOP Rituxan for 6 cycles and achieved a complete response. Routine followup CT scan done 05/23/2011 showed a cluster of subcentimeter lymph nodes in the left neck just above the level of the thoracic inlet. There were small subcentimeter lymph nodes palpable clinically as well. Please see my 07/14/2011 note for full details. CT scans did not show any other disease outside of the neck . PET scan showed low grade activity hard to evaluate because nodes were in an area of brown fat. No node was above 8 mm. I referred her for a lymph node biopsy done 07/02/2011. 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 It has been well described before that some patients clear the high-grade component of their lymphoma with initial treatment only to have a low-grade component develop later in their history. She was referred for radiation therapy given the localized and nonbulky  nature of the recurrence. She received 30 gray by Dr. Margaretmary Dys between November 1 on 08/05/2011. In view of her young age, I elected to put her on a maintenance Rituxan program 375 mg per meter squared every other month for 4 doses. She has had 2 doses to date. She is tolerating the Rituxan well. She has had chronic leukopenia since her original treatments. Neutrophils remain above 1000 and she has not had any problems with infections. She's had no interim medical problems. No new medications. CT scans neck  chest abdomen and pelvis done in anticipation of today's visit on 10/23/2011 which I personally reviewed show some radiation changes and regression of the small lymph nodes in the left supraclavicular region and no new areas of adenopathy in the neck chest abdomen or pelvis.  Medications: reviewed  Allergies: No Known Allergies  Review of Systems: Constitutional:   No constitutional symptoms Respiratory: No cough or dyspnea Cardiovascular:  No ischemic type chest pain or palpitations Gastrointestinal: No abdominal pain not Genito-Urinary: Not questioned Musculoskeletal: No pain Neurologic: Not questioned Skin: Not questioned Remaining ROS negative.  Physical Exam: Blood pressure 110/71, pulse 56, temperature 97.1 F (36.2 C), temperature source Oral, height 5' 6.5" (1.689 m), weight 148 lb 12.8 oz (67.495 kg). Wt Readings from Last 3 Encounters:  10/31/11 148 lb 12.8 oz (67.495 kg)  09/18/11 146 lb 14.4 oz (66.633 kg)  08/29/11 148 lb 6.4 oz (67.314 kg)     General appearance: Healthy appearing Caucasian woman HENNT: Pharynx no erythema or exudate neck full range of motion Lymph nodes: Left supraclavicular lymph nodes no longer palpable no other areas of adenopathy in the neck supraclavicular axillary or inguinal regions Breasts: Not examined Lungs: Clear to auscultation resonant to percussion Heart: Regular cardiac rhythm no murmur Abdomen: Soft nontender no mass no organomegaly Extremities: No edema no calf tenderness Vascular: No cyanosis Neurologic: Grossly normal Skin: Normal  Lab Results: Lab Results  Component Value Date   WBC 2.7* 10/31/2011   HGB 13.1 10/31/2011   HCT 37.0 10/31/2011   MCV 90.0 10/31/2011   PLT 180 10/31/2011  Chemistry      Component Value Date/Time   NA 143 08/22/2011 0835   NA 148* 05/23/2011 0900   K 4.3 08/22/2011 0835   K 4.4 05/23/2011 0900   CL 104 08/22/2011 0835   CL 104 05/23/2011 0900   CO2 29 08/22/2011 0835   CO2 29 05/23/2011 0900    BUN 17 08/22/2011 0835   BUN 16 05/23/2011 0900   CREATININE 0.74 08/22/2011 0835   CREATININE 0.8 05/23/2011 0900      Component Value Date/Time   CALCIUM 10.4 08/22/2011 0835   CALCIUM 9.8 05/23/2011 0900   ALKPHOS 29* 08/22/2011 0835   ALKPHOS 26 05/23/2011 0900   AST 27 08/22/2011 0835   AST 27 05/23/2011 0900   ALT 43* 08/22/2011 0835   BILITOT 1.4* 08/22/2011 0835   BILITOT 1.80* 05/23/2011 0900       Radiological Studies: See discussion above. Data available in Epic system   Impression and Plan: #1. Initial bulky stage IIIA high-grade B-cell non-Hodgkin's lymphoma diagnosed January 2010 treated as outlined above. No evidence for new high-grade lymphoma.  #2. Low-grade non-Hodgkin's lymphoma limited to both the left supraclavicular lymph nodes diagnosed September 2012 treated as outlined above. Once again she is achieved a complete radiographic and clinical remission. Plan: Continue maintenance Rituxan program.  #3. Chronic leukopenia without absolute granulocyte appeared due to previous treatments.   CC:. Dr. Simone Curia; Osborn Coho; Margaretmary Dys; Adair Laundry, MD 2/15/201310:56 AM

## 2011-10-31 NOTE — Telephone Encounter (Signed)
Ct and dr g appt added to sch all other tx were already on sch,printed for pt  aom

## 2011-12-19 ENCOUNTER — Other Ambulatory Visit: Payer: 59 | Admitting: Lab

## 2011-12-19 ENCOUNTER — Ambulatory Visit: Payer: 59

## 2011-12-22 ENCOUNTER — Other Ambulatory Visit: Payer: 59 | Admitting: Lab

## 2011-12-22 ENCOUNTER — Ambulatory Visit: Payer: 59

## 2011-12-26 ENCOUNTER — Other Ambulatory Visit (HOSPITAL_BASED_OUTPATIENT_CLINIC_OR_DEPARTMENT_OTHER): Payer: 59 | Admitting: Lab

## 2011-12-26 ENCOUNTER — Ambulatory Visit (HOSPITAL_BASED_OUTPATIENT_CLINIC_OR_DEPARTMENT_OTHER): Payer: 59

## 2011-12-26 VITALS — BP 115/75 | HR 50 | Temp 98.2°F

## 2011-12-26 DIAGNOSIS — C8589 Other specified types of non-Hodgkin lymphoma, extranodal and solid organ sites: Secondary | ICD-10-CM

## 2011-12-26 DIAGNOSIS — Z5112 Encounter for antineoplastic immunotherapy: Secondary | ICD-10-CM

## 2011-12-26 DIAGNOSIS — C8590 Non-Hodgkin lymphoma, unspecified, unspecified site: Secondary | ICD-10-CM

## 2011-12-26 DIAGNOSIS — C8510 Unspecified B-cell lymphoma, unspecified site: Secondary | ICD-10-CM

## 2011-12-26 LAB — CBC WITH DIFFERENTIAL/PLATELET
BASO%: 0.6 % (ref 0.0–2.0)
HCT: 37.9 % (ref 34.8–46.6)
HGB: 13.1 g/dL (ref 11.6–15.9)
MCH: 31.3 pg (ref 25.1–34.0)
MCV: 90.7 fL (ref 79.5–101.0)
nRBC: 0 % (ref 0–0)

## 2011-12-26 MED ORDER — SODIUM CHLORIDE 0.9 % IV SOLN: 375.0000 mg/m2 | Freq: Once | INTRAVENOUS | Status: DC

## 2011-12-26 MED ORDER — ACETAMINOPHEN 325 MG PO TABS
650.0000 mg | ORAL_TABLET | Freq: Once | ORAL | Status: AC
  Administered 2011-12-26: 650 mg via ORAL

## 2011-12-26 MED ORDER — SODIUM CHLORIDE 0.9 % IV SOLN
375.0000 mg/m2 | Freq: Once | INTRAVENOUS | Status: AC
  Administered 2011-12-26: 700 mg via INTRAVENOUS
  Filled 2011-12-26: qty 70

## 2011-12-26 MED ORDER — SODIUM CHLORIDE 0.9 % IV SOLN
Freq: Once | INTRAVENOUS | Status: AC
  Administered 2011-12-26: 11:00:00 via INTRAVENOUS

## 2011-12-26 MED ORDER — DIPHENHYDRAMINE HCL 25 MG PO CAPS
50.0000 mg | ORAL_CAPSULE | Freq: Once | ORAL | Status: AC
  Administered 2011-12-26: 50 mg via ORAL

## 2012-02-17 ENCOUNTER — Other Ambulatory Visit (HOSPITAL_BASED_OUTPATIENT_CLINIC_OR_DEPARTMENT_OTHER): Payer: 59 | Admitting: Lab

## 2012-02-17 ENCOUNTER — Ambulatory Visit (HOSPITAL_COMMUNITY)
Admission: RE | Admit: 2012-02-17 | Discharge: 2012-02-17 | Disposition: A | Discharge status: Home / Self Care | Payer: 59 | Source: Ambulatory Visit | Attending: Oncology | Admitting: Oncology

## 2012-02-17 DIAGNOSIS — Z923 Personal history of irradiation: Secondary | ICD-10-CM | POA: Insufficient documentation

## 2012-02-17 DIAGNOSIS — C8510 Unspecified B-cell lymphoma, unspecified site: Secondary | ICD-10-CM

## 2012-02-17 DIAGNOSIS — C8581 Other specified types of non-Hodgkin lymphoma, lymph nodes of head, face, and neck: Secondary | ICD-10-CM | POA: Insufficient documentation

## 2012-02-17 DIAGNOSIS — C8590 Non-Hodgkin lymphoma, unspecified, unspecified site: Secondary | ICD-10-CM

## 2012-02-17 DIAGNOSIS — C8589 Other specified types of non-Hodgkin lymphoma, extranodal and solid organ sites: Secondary | ICD-10-CM

## 2012-02-17 LAB — CBC WITH DIFFERENTIAL/PLATELET
BASO%: 0.7 % (ref 0.0–2.0)
Basophils Absolute: 0 10*3/uL (ref 0.0–0.1)
EOS%: 7.6 % — ABNORMAL HIGH (ref 0.0–7.0)
HCT: 38.1 % (ref 34.8–46.6)
HGB: 13.1 g/dL (ref 11.6–15.9)
MCH: 32 pg (ref 25.1–34.0)
MONO#: 0.3 10*3/uL (ref 0.1–0.9)
NEUT#: 2.1 10*3/uL (ref 1.5–6.5)
NEUT%: 63.6 % (ref 38.4–76.8)
RDW: 12.6 % (ref 11.2–14.5)
WBC: 3.3 10*3/uL — ABNORMAL LOW (ref 3.9–10.3)
lymph#: 0.6 10*3/uL — ABNORMAL LOW (ref 0.9–3.3)

## 2012-02-17 LAB — CMP (CANCER CENTER ONLY)
ALT(SGPT): 35 U/L (ref 10–47)
Albumin: 4.2 g/dL (ref 3.3–5.5)
Alkaline Phosphatase: 33 U/L (ref 26–84)
CO2: 27 mEq/L (ref 18–33)
Glucose, Bld: 97 mg/dL (ref 73–118)
Potassium: 4.6 mEq/L (ref 3.3–4.7)
Sodium: 143 mEq/L (ref 128–145)
Total Bilirubin: 2.5 mg/dl — ABNORMAL HIGH (ref 0.20–1.60)
Total Protein: 7 g/dL (ref 6.4–8.1)

## 2012-02-17 LAB — LACTATE DEHYDROGENASE: LDH: 147 U/L (ref 94–250)

## 2012-02-17 MED ORDER — IOHEXOL 300 MG/ML  SOLN
100.0000 mL | Freq: Once | INTRAMUSCULAR | Status: AC | PRN
  Administered 2012-02-17: 100 mL via INTRAVENOUS

## 2012-02-19 ENCOUNTER — Telehealth: Payer: Self-pay | Admitting: *Deleted

## 2012-02-19 NOTE — Telephone Encounter (Signed)
Message copied by Sabino Snipes on Thu Feb 19, 2012  3:52 PM ------      Message from: Levert Feinstein      Created: Wed Feb 18, 2012  9:07 AM       Call pt CT  good

## 2012-02-19 NOTE — Telephone Encounter (Signed)
Pt notified of CT results per Dr Granfortuna.  

## 2012-02-20 ENCOUNTER — Encounter: Payer: Self-pay | Admitting: Oncology

## 2012-02-20 ENCOUNTER — Telehealth: Payer: Self-pay | Admitting: Oncology

## 2012-02-20 ENCOUNTER — Other Ambulatory Visit (HOSPITAL_BASED_OUTPATIENT_CLINIC_OR_DEPARTMENT_OTHER): Payer: 59 | Admitting: Lab

## 2012-02-20 ENCOUNTER — Ambulatory Visit (HOSPITAL_BASED_OUTPATIENT_CLINIC_OR_DEPARTMENT_OTHER): Payer: 59

## 2012-02-20 ENCOUNTER — Ambulatory Visit: Payer: 59

## 2012-02-20 ENCOUNTER — Ambulatory Visit (HOSPITAL_BASED_OUTPATIENT_CLINIC_OR_DEPARTMENT_OTHER): Payer: 59 | Admitting: Oncology

## 2012-02-20 VITALS — BP 108/73 | HR 61 | Temp 98.1°F | Ht 66.0 in | Wt 149.6 lb

## 2012-02-20 VITALS — BP 94/66 | HR 52 | Temp 98.0°F

## 2012-02-20 DIAGNOSIS — R17 Unspecified jaundice: Secondary | ICD-10-CM

## 2012-02-20 DIAGNOSIS — C8581 Other specified types of non-Hodgkin lymphoma, lymph nodes of head, face, and neck: Secondary | ICD-10-CM

## 2012-02-20 DIAGNOSIS — D72819 Decreased white blood cell count, unspecified: Secondary | ICD-10-CM

## 2012-02-20 DIAGNOSIS — C8510 Unspecified B-cell lymphoma, unspecified site: Secondary | ICD-10-CM

## 2012-02-20 DIAGNOSIS — C8590 Non-Hodgkin lymphoma, unspecified, unspecified site: Secondary | ICD-10-CM

## 2012-02-20 DIAGNOSIS — C8589 Other specified types of non-Hodgkin lymphoma, extranodal and solid organ sites: Secondary | ICD-10-CM

## 2012-02-20 DIAGNOSIS — T50904A Poisoning by unspecified drugs, medicaments and biological substances, undetermined, initial encounter: Secondary | ICD-10-CM

## 2012-02-20 DIAGNOSIS — Z5112 Encounter for antineoplastic immunotherapy: Secondary | ICD-10-CM

## 2012-02-20 DIAGNOSIS — D702 Other drug-induced agranulocytosis: Secondary | ICD-10-CM | POA: Insufficient documentation

## 2012-02-20 HISTORY — DX: Other disorders of bilirubin metabolism: E80.6

## 2012-02-20 HISTORY — DX: Other drug-induced agranulocytosis: D70.2

## 2012-02-20 LAB — CBC WITH DIFFERENTIAL/PLATELET
BASO%: 1 % (ref 0.0–2.0)
EOS%: 9.8 % — ABNORMAL HIGH (ref 0.0–7.0)
LYMPH%: 22 % (ref 14.0–49.7)
MCHC: 35.5 g/dL (ref 31.5–36.0)
MCV: 89.8 fL (ref 79.5–101.0)
MONO%: 9.8 % (ref 0.0–14.0)
Platelets: 179 10*3/uL (ref 145–400)
RBC: 4.3 10*6/uL (ref 3.70–5.45)
RDW: 12.7 % (ref 11.2–14.5)

## 2012-02-20 MED ORDER — SODIUM CHLORIDE 0.9 % IV SOLN
700.0000 mg | Freq: Once | INTRAVENOUS | Status: AC
  Administered 2012-02-20: 700 mg via INTRAVENOUS
  Filled 2012-02-20: qty 70

## 2012-02-20 MED ORDER — DIPHENHYDRAMINE HCL 25 MG PO CAPS
50.0000 mg | ORAL_CAPSULE | Freq: Once | ORAL | Status: AC
  Administered 2012-02-20: 50 mg via ORAL

## 2012-02-20 MED ORDER — SODIUM CHLORIDE 0.9 % IV SOLN
Freq: Once | INTRAVENOUS | Status: AC
  Administered 2012-02-20: 11:00:00 via INTRAVENOUS

## 2012-02-20 MED ORDER — SODIUM CHLORIDE 0.9 % IV SOLN: 375.0000 mg/m2 | Freq: Once | INTRAVENOUS | Status: DC

## 2012-02-20 MED ORDER — ACETAMINOPHEN 325 MG PO TABS
650.0000 mg | ORAL_TABLET | Freq: Once | ORAL | Status: AC
  Administered 2012-02-20: 650 mg via ORAL

## 2012-02-20 NOTE — Progress Notes (Signed)
Hematology and Oncology Follow Up Visit  Katelyn Irwin [id number] 16-Aug-1966 46 y.o. 02/20/2012 9:39 AM   Principle Diagnosis: Encounter Diagnoses  Name Primary?  . Malignant lymphoma, high grade Yes  . Low grade B-cell lymphoma      Interim History:   Followup visit for this pleasant 46 year old woman who initially presented with pelvic pain with findings of bulky pelvic lymphadenopathy. 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 She was initially treated with 6 cycles of CHOP Rituxan and achieved a complete response. On a routine followup CT scan of the neck and chest done in September 2012 she was found to have a cluster of small left supraclavicular lymph nodes enlarged. These had low level activity on a PET scan which was obscured by  surrounding brown fat. There was no other uptake to suggest additional areas of disease. A lymph node biopsy was done and in fact showed that the lymph nodes contained low-grade lymphoma and not recurrent high-grade disease. She was treated with single agent Rituxan plus involved field radiation. She is currently on a maintenance Rituxan program with the plan of one dose every 2 months for 4 treatments. She will receive her third of 4 planned maintenance doses today and complete all planned treatments on 04/16/2012.  She continues to do well. She has no symptoms at this time. She got through the winter without any infection. She has chronic leukopenia without absolute granulocytopenia due to previous chemotherapy treatments. She has a chronic mild elevation of serum bilirubin which has fluctuated over time. Remaining liver functions and LDH are normal.  CT scan neck chest abdomen and pelvis done in anticipation of today's visit on 02/17/2012 which I personally reviewed shows no evidence for new or progressive disease. In the left neck there is further decrease in size of lymph nodes with the largest lymph node now  measured as 4 mm.  Medications: reviewed  Allergies: No Known Allergies  Review of Systems: Constitutional:   No constitutional symptoms Respiratory: No cough or dyspnea Cardiovascular:  No chest pain or palpitations Gastrointestinal: No change in bowel habit Genito-Urinary: Not questioned Musculoskeletal: No bone pain Neurologic: No headache or change in vision Skin: No rash Remaining ROS negative.  Physical Exam: There were no vitals taken for this visit. Wt Readings from Last 3 Encounters:  10/31/11 148 lb 12.8 oz (67.495 kg)  09/18/11 146 lb 14.4 oz (66.633 kg)  08/29/11 148 lb 6.4 oz (67.314 kg)     General appearance:  HENNT: Oropharynx normal to inspection no erythema exudate or mass Lymph nodes: No palpable cervical supraclavicular or axillary adenopathy Breasts: Not examined Lungs: Clear to auscultation resonant to percussion Heart: Regular rhythm no murmur or gallop Abdomen: Soft nontender no mass no organomegaly. No inguinal adenopathy. Extremities: No edema no calf tenderness. Vascular: No cyanosis. Neurologic: Grossly normal no focal deficit. Skin: No rash or ecchymosis  Lab Results: Lab Results  Component Value Date   WBC 3.0* 02/20/2012   HGB 13.7 02/20/2012   HCT 38.6 02/20/2012   MCV 89.8 02/20/2012   PLT 179 02/20/2012     Chemistry      Component Value Date/Time   NA 143 02/17/2012 0952   NA 141 10/31/2011 0958   K 4.6 02/17/2012 0952   K 4.2 10/31/2011 0958   CL 101 02/17/2012 0952   CL 104 10/31/2011 0958   CO2 27 02/17/2012 0952   CO2 27 10/31/2011 0958   BUN 17 02/17/2012 0952   BUN  17 10/31/2011 0958   CREATININE 1.0 02/17/2012 0952   CREATININE 0.82 10/31/2011 0958      Component Value Date/Time   CALCIUM 9.0 02/17/2012 0952   CALCIUM 9.8 10/31/2011 0958   ALKPHOS 33 02/17/2012 0952   ALKPHOS 22* 10/31/2011 0958   AST 25 02/17/2012 0952   AST 20 10/31/2011 0958   ALT 24 10/31/2011 0958   BILITOT 2.50* 02/17/2012 0952   BILITOT 1.7* 10/31/2011 0958        Radiological Studies: See discussion above  Impression and Plan: #1. 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 She is in a complete clinical and radiographic remission at this time. Plan continue with maintenance Rituxan as outlined above.  #2. Status post involved field radiation to the left neck for low-grade non-Hodgkin's lymphoma. I will check thyroid functions at time of next lab analysis in 2 months to exclude hypothyroidism from prior radiation.  #3. Chronic leukopenia without absolute granulocytopenia stable over time and likely resulted previous chemotherapy.  #4. Mild fluctuating hyperbilirubinemia. 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   CC:. Dr. Simone Curia; Dr. Osborn Coho; Dr. Margaretmary Dys; Dr. Leone Payor   Levert Feinstein, MD 6/7/20139:39 AM

## 2012-02-20 NOTE — Telephone Encounter (Signed)
Gv pt appt for [id number].  schedule pt for ct scan on 10/14 @ [facility]

## 2012-02-20 NOTE — Patient Instructions (Signed)
[facility] Discharge Instructions for Patients Receiving Chemotherapy  Today you received the following chemotherapy agents Rituxan   To help prevent nausea and vomiting after your treatment, we encourage you to take your nausea medication    If you develop nausea and vomiting that is not controlled by your nausea medication, call the clinic. If it is after clinic hours your family physician or the after hours number for the clinic or go to the Emergency Department.   BELOW ARE SYMPTOMS THAT SHOULD BE REPORTED IMMEDIATELY:  *FEVER GREATER THAN 100.5 F  *CHILLS WITH OR WITHOUT FEVER  NAUSEA AND VOMITING THAT IS NOT CONTROLLED WITH YOUR NAUSEA MEDICATION  *UNUSUAL SHORTNESS OF BREATH  *UNUSUAL BRUISING OR BLEEDING  TENDERNESS IN MOUTH AND THROAT WITH OR WITHOUT PRESENCE OF ULCERS  *URINARY PROBLEMS  *BOWEL PROBLEMS  UNUSUAL RASH Items with * indicate a potential emergency and should be followed up as soon as possible.  One of the nurses will contact you 24 hours after your treatment. Please let the nurse know about any problems that you may have experienced. Feel free to call the clinic you have any questions or concerns. The clinic phone number is [phone].   I have been informed and understand all the instructions given to me. I know to contact the clinic, my physician, or go to the Emergency Department if any problems should occur. I do not have any questions at this time, but understand that I may call the clinic during office hours   should I have any questions or need assistance in obtaining follow up care.    __________________________________________  _____________  __________ Signature of Patient or Authorized Representative            Date                   Time    __________________________________________ Nurse's Signature    

## 2012-04-16 ENCOUNTER — Ambulatory Visit (HOSPITAL_BASED_OUTPATIENT_CLINIC_OR_DEPARTMENT_OTHER): Payer: 59 | Admitting: Nurse Practitioner

## 2012-04-16 ENCOUNTER — Other Ambulatory Visit (HOSPITAL_BASED_OUTPATIENT_CLINIC_OR_DEPARTMENT_OTHER): Payer: 59 | Admitting: Lab

## 2012-04-16 ENCOUNTER — Ambulatory Visit (HOSPITAL_BASED_OUTPATIENT_CLINIC_OR_DEPARTMENT_OTHER): Payer: 59

## 2012-04-16 VITALS — BP 108/70 | HR 60 | Temp 98.3°F | Resp 20 | Ht 66.0 in | Wt 150.0 lb

## 2012-04-16 VITALS — BP 111/71 | HR 66 | Temp 97.1°F | Resp 20

## 2012-04-16 DIAGNOSIS — Z5112 Encounter for antineoplastic immunotherapy: Secondary | ICD-10-CM

## 2012-04-16 DIAGNOSIS — C8590 Non-Hodgkin lymphoma, unspecified, unspecified site: Secondary | ICD-10-CM

## 2012-04-16 DIAGNOSIS — C8589 Other specified types of non-Hodgkin lymphoma, extranodal and solid organ sites: Secondary | ICD-10-CM

## 2012-04-16 DIAGNOSIS — C8581 Other specified types of non-Hodgkin lymphoma, lymph nodes of head, face, and neck: Secondary | ICD-10-CM

## 2012-04-16 DIAGNOSIS — C8510 Unspecified B-cell lymphoma, unspecified site: Secondary | ICD-10-CM

## 2012-04-16 DIAGNOSIS — D72819 Decreased white blood cell count, unspecified: Secondary | ICD-10-CM

## 2012-04-16 DIAGNOSIS — R17 Unspecified jaundice: Secondary | ICD-10-CM

## 2012-04-16 LAB — COMPREHENSIVE METABOLIC PANEL
ALT: 26 U/L (ref 0–35)
AST: 21 U/L (ref 0–37)
Alkaline Phosphatase: 29 U/L — ABNORMAL LOW (ref 39–117)
Calcium: 10.3 mg/dL (ref 8.4–10.5)
Chloride: 104 mEq/L (ref 96–112)
Creatinine, Ser: 0.83 mg/dL (ref 0.50–1.10)
Total Bilirubin: 1.5 mg/dL — ABNORMAL HIGH (ref 0.3–1.2)

## 2012-04-16 LAB — LACTATE DEHYDROGENASE: LDH: 153 U/L (ref 94–250)

## 2012-04-16 LAB — CBC WITH DIFFERENTIAL/PLATELET
Eosinophils Absolute: 0.2 10*3/uL (ref 0.0–0.5)
MCV: 89.2 fL (ref 79.5–101.0)
MONO%: 11.1 % (ref 0.0–14.0)
NEUT#: 1.8 10*3/uL (ref 1.5–6.5)
RBC: 4.15 10*6/uL (ref 3.70–5.45)
RDW: 12.8 % (ref 11.2–14.5)
WBC: 3.2 10*3/uL — ABNORMAL LOW (ref 3.9–10.3)
nRBC: 0 % (ref 0–0)

## 2012-04-16 LAB — SEDIMENTATION RATE: Sed Rate: 6 mm/hr (ref 0–22)

## 2012-04-16 MED ORDER — SODIUM CHLORIDE 0.9 % IV SOLN
700.0000 mg | Freq: Once | INTRAVENOUS | Status: AC
  Administered 2012-04-16: 700 mg via INTRAVENOUS
  Filled 2012-04-16: qty 70

## 2012-04-16 MED ORDER — ACETAMINOPHEN 325 MG PO TABS
650.0000 mg | ORAL_TABLET | Freq: Once | ORAL | Status: AC
  Administered 2012-04-16: 650 mg via ORAL

## 2012-04-16 MED ORDER — SODIUM CHLORIDE 0.9 % IV SOLN
Freq: Once | INTRAVENOUS | Status: AC
  Administered 2012-04-16: 10:00:00 via INTRAVENOUS

## 2012-04-16 MED ORDER — SODIUM CHLORIDE 0.9 % IV SOLN: 375.0000 mg/m2 | Freq: Once | INTRAVENOUS | Status: DC

## 2012-04-16 MED ORDER — DIPHENHYDRAMINE HCL 25 MG PO CAPS
50.0000 mg | ORAL_CAPSULE | Freq: Once | ORAL | Status: AC
  Administered 2012-04-16: 50 mg via ORAL

## 2012-04-16 NOTE — Patient Instructions (Signed)
[facility] Discharge Instructions for Patients Receiving Chemotherapy  Today you received the following chemotherapy agents Rituxan   To help prevent nausea and vomiting after your treatment, we encourage you to take your nausea medication as prescribed If you develop nausea and vomiting that is not controlled by your nausea medication, call the clinic. If it is after clinic hours your family physician or the after hours number for the clinic or go to the Emergency Department.   BELOW ARE SYMPTOMS THAT SHOULD BE REPORTED IMMEDIATELY:  *FEVER GREATER THAN 100.5 F  *CHILLS WITH OR WITHOUT FEVER  NAUSEA AND VOMITING THAT IS NOT CONTROLLED WITH YOUR NAUSEA MEDICATION  *UNUSUAL SHORTNESS OF BREATH  *UNUSUAL BRUISING OR BLEEDING  TENDERNESS IN MOUTH AND THROAT WITH OR WITHOUT PRESENCE OF ULCERS  *URINARY PROBLEMS  *BOWEL PROBLEMS  UNUSUAL RASH Items with * indicate a potential emergency and should be followed up as soon as possible.  One of the nurses will contact you 24 hours after your treatment. Please let the nurse know about any problems that you may have experienced. Feel free to call the clinic you have any questions or concerns. The clinic phone number is [phone].   I have been informed and understand all the instructions given to me. I know to contact the clinic, my physician, or go to the Emergency Department if any problems should occur. I do not have any questions at this time, but understand that I may call the clinic during office hours   should I have any questions or need assistance in obtaining follow up care.    __________________________________________  _____________  __________ Signature of Patient or Authorized Representative            Date                   Time    __________________________________________ Nurse's Signature    

## 2012-04-16 NOTE — Progress Notes (Signed)
OFFICE PROGRESS NOTE  Interval history:  Katelyn Irwin is a 46 year old woman diagnosed with high-grade B-cell non-Hodgkin's lymphoma in January 2010. She initially presented with pelvic pain and was found to have bulky pelvic lymphadenopathy. She completed 6 cycles of CHOP/Rituxan with a complete response. Routine followup CT scan of the neck and chest in September 2012 showed a cluster of small left supraclavicular lymph nodes that had enlarged. A PET scan showed low level activity. There was no other uptake to suggest additional areas of disease. Lymph node biopsy showed low-grade lymphoma. She completed involved field radiation and is currently on a maintenance Rituxan program every 2 months for 4 treatments. She completed the third treatment on 02/20/2012. Restaging CT scans of the neck, chest, abdomen and pelvis on 02/17/2012 showed no evidence for new or progressive disease. In the left neck there was further decrease in the size of lymph nodes with the largest lymph node measuring 4 mm.   She is seen today prior to proceeding with the fourth and final Rituxan.  Katelyn Irwin reports that she feels well. She has a good appetite and good energy level. No fevers or sweats. No reaction to the Rituxan. She denies pain. No shortness of breath or cough.   Objective: Blood pressure 108/70, pulse 60, temperature 98.3 F (36.8 C), temperature source Oral, resp. rate 20, height 5\' 6"  (1.676 m), weight 150 lb (68.04 kg).  Oropharynx is without thrush or ulceration. No palpable cervical, supraclavicular, axillary or inguinal lymph nodes. Lungs are clear. No wheezes or rales. Regular cardiac rhythm. Abdomen is soft and nontender. No organomegaly. Extremities without edema. Calves soft and nontender. Motor strength 5 over 5. Knee DTRs 2+, symmetric.  Lab Results: Lab Results  Component Value Date   WBC 3.2* 04/16/2012   HGB 13.2 04/16/2012   HCT 37.0 04/16/2012   MCV 89.2 04/16/2012   PLT 176 04/16/2012     Chemistry:    Chemistry      Component Value Date/Time   NA 143 02/17/2012 0952   NA 141 10/31/2011 0958   K 4.6 02/17/2012 0952   K 4.2 10/31/2011 0958   CL 101 02/17/2012 0952   CL 104 10/31/2011 0958   CO2 27 02/17/2012 0952   CO2 27 10/31/2011 0958   BUN 17 02/17/2012 0952   BUN 17 10/31/2011 0958   CREATININE 1.0 02/17/2012 0952   CREATININE 0.82 10/31/2011 0958      Component Value Date/Time   CALCIUM 9.0 02/17/2012 0952   CALCIUM 9.8 10/31/2011 0958   ALKPHOS 33 02/17/2012 0952   ALKPHOS 22* 10/31/2011 0958   AST 25 02/17/2012 0952   AST 20 10/31/2011 0958   ALT 24 10/31/2011 0958   BILITOT 2.50* 02/17/2012 0952   BILITOT 1.7* 10/31/2011 0958       Studies/Results: No results found.  Medications: I have reviewed the patient's current medications.  Assessment/Plan:  1. 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 2. Status post involved field radiation to the left neck for low-grade non-Hodgkin's lymphoma. TSH checked at today's lab visit to exclude hypothyroidism from prior radiation. 3. Chronic leukopenia without absolute granulocytopenia. Likely related to previous chemotherapy. Stable. 4. Mild fluctuating hyperbilirubinemia. 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  Disposition-Katelyn Irwin appears stable. She will complete the fourth and final treatment with Rituxan today. She is scheduled for restaging CT scans 06/28/2012. She will return for a followup visit with Dr. Cyndie Chime on 07/02/2012. She will contact the office in the interim  with any problems.  Plan reviewed with Dr. Cyndie Chime.  Lonna Cobb ANP/GNP-BC

## 2012-06-28 ENCOUNTER — Ambulatory Visit (HOSPITAL_COMMUNITY)
Admission: RE | Admit: 2012-06-28 | Discharge: 2012-06-28 | Disposition: A | Discharge status: Home / Self Care | Payer: 59 | Source: Ambulatory Visit | Attending: Oncology | Admitting: Oncology

## 2012-06-28 ENCOUNTER — Other Ambulatory Visit: Payer: 59 | Admitting: Lab

## 2012-06-28 ENCOUNTER — Other Ambulatory Visit (HOSPITAL_BASED_OUTPATIENT_CLINIC_OR_DEPARTMENT_OTHER): Payer: 59 | Admitting: Lab

## 2012-06-28 DIAGNOSIS — N200 Calculus of kidney: Secondary | ICD-10-CM | POA: Insufficient documentation

## 2012-06-28 DIAGNOSIS — C8510 Unspecified B-cell lymphoma, unspecified site: Secondary | ICD-10-CM

## 2012-06-28 DIAGNOSIS — C8589 Other specified types of non-Hodgkin lymphoma, extranodal and solid organ sites: Secondary | ICD-10-CM | POA: Insufficient documentation

## 2012-06-28 DIAGNOSIS — C8590 Non-Hodgkin lymphoma, unspecified, unspecified site: Secondary | ICD-10-CM

## 2012-06-28 LAB — COMPREHENSIVE METABOLIC PANEL
Albumin: 4.7 g/dL (ref 3.5–5.2)
BUN: 14 mg/dL (ref 6–23)
CO2: 30 mEq/L (ref 19–32)
Calcium: 10.3 mg/dL (ref 8.4–10.5)
Chloride: 101 mEq/L (ref 96–112)
Creatinine, Ser: 0.75 mg/dL (ref 0.50–1.10)
Glucose, Bld: 91 mg/dL (ref 70–99)

## 2012-06-28 LAB — CBC WITH DIFFERENTIAL/PLATELET
BASO%: 0.5 % (ref 0.0–2.0)
Basophils Absolute: 0 10*3/uL (ref 0.0–0.1)
Eosinophils Absolute: 0.2 10*3/uL (ref 0.0–0.5)
HCT: 38.4 % (ref 34.8–46.6)
HGB: 13.4 g/dL (ref 11.6–15.9)
LYMPH%: 21.1 % (ref 14.0–49.7)
MCHC: 35 g/dL (ref 31.5–36.0)
MONO#: 0.3 10*3/uL (ref 0.1–0.9)
NEUT%: 65.2 % (ref 38.4–76.8)
Platelets: 180 10*3/uL (ref 145–400)
WBC: 3.9 10*3/uL (ref 3.9–10.3)

## 2012-06-28 MED ORDER — IOHEXOL 300 MG/ML  SOLN
100.0000 mL | Freq: Once | INTRAMUSCULAR | Status: AC | PRN
  Administered 2012-06-28: 100 mL via INTRAVENOUS

## 2012-06-29 ENCOUNTER — Telehealth: Payer: Self-pay | Admitting: *Deleted

## 2012-06-29 NOTE — Telephone Encounter (Signed)
Called and spoke with pt; per Dr. Cyndie Chime CT negative for recurrent lymphoma.  Pt verbalized understanding and confirmed appt for 07/02/12 at 4pm.

## 2012-06-29 NOTE — Telephone Encounter (Signed)
Message copied by Gala Romney on Tue Jun 29, 2012 11:55 AM ------      Message from: Levert Feinstein      Created: Mon Jun 28, 2012  6:46 PM       Call pt  CT negative for recurrent lymphoma

## 2012-07-02 ENCOUNTER — Telehealth: Payer: Self-pay | Admitting: Oncology

## 2012-07-02 ENCOUNTER — Ambulatory Visit (HOSPITAL_BASED_OUTPATIENT_CLINIC_OR_DEPARTMENT_OTHER): Payer: 59 | Admitting: Oncology

## 2012-07-02 VITALS — BP 107/67 | HR 70 | Temp 98.5°F | Resp 20 | Ht 66.0 in | Wt 150.0 lb

## 2012-07-02 DIAGNOSIS — C8581 Other specified types of non-Hodgkin lymphoma, lymph nodes of head, face, and neck: Secondary | ICD-10-CM

## 2012-07-02 DIAGNOSIS — C8590 Non-Hodgkin lymphoma, unspecified, unspecified site: Secondary | ICD-10-CM

## 2012-07-02 DIAGNOSIS — R234 Changes in skin texture: Secondary | ICD-10-CM

## 2012-07-02 DIAGNOSIS — R17 Unspecified jaundice: Secondary | ICD-10-CM

## 2012-07-02 DIAGNOSIS — C8510 Unspecified B-cell lymphoma, unspecified site: Secondary | ICD-10-CM

## 2012-07-02 DIAGNOSIS — D708 Other neutropenia: Secondary | ICD-10-CM

## 2012-07-02 MED ORDER — INFLUENZA VIRUS VACC SPLIT PF IM SUSP
0.5000 mL | Freq: Once | INTRAMUSCULAR | Status: AC
  Administered 2012-07-02: 0.5 mL via INTRAMUSCULAR
  Filled 2012-07-02: qty 0.5

## 2012-07-02 NOTE — Patient Instructions (Signed)
Lab & CT scans in January 03 2013.   MD visit w Don Perking 01/04/13

## 2012-07-02 NOTE — Telephone Encounter (Signed)
Gave pt appt for April 2014 lab and MD, Ct will have to be scheduled on Monday 10/21

## 2012-07-02 NOTE — Progress Notes (Signed)
Hematology and Oncology Follow Up Visit  Katelyn Irwin [id number] [birth year] 46 y.o. 07/02/2012 5:47 PM   Principle Diagnosis: Encounter Diagnoses  Name Primary?  . Low grade B-cell lymphoma Yes  . High grade malignant lymphoma      Interim History:   Followup visit for this pleasant 46 year old woman who initially presented with pelvic pain with findings of bulky pelvic lymphadenopathy. 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 She was initially treated with 6 cycles of CHOP Rituxan and achieved a complete response.  On a routine followup CT scan of the neck and chest done in September 2012 she was found to have a cluster of small left supraclavicular lymph nodes enlarged. These had low level activity on a PET scan which was obscured by surrounding brown fat. There was no other uptake to suggest additional areas of disease. A lymph node biopsy was done and in fact showed that the lymph nodes contained low-grade lymphoma and not recurrent high-grade disease.  She was treated with single agent Rituxan plus involved field radiation. She is currently on a maintenance Rituxan program with the plan of one dose every 2 months for 4 treatments. She  completed all planned treatments on 04/16/2012.  She is doing well at this time. The only new thing she has noticed R. some skin changes on her left cheek. No constitutional symptoms.  CT scans of the neck chest abdomen and pelvis done in anticipation of today's visit which I personally reviewed show ongoing complete radiographic response in all areas.   Medications: reviewed  Allergies: No Known Allergies  Review of Systems: Constitutional:   See above Respiratory: No cough or dyspnea Cardiovascular:  No chest pain or palpitations Gastrointestinal: No change in bowel habit Genito-Urinary: Not questioned Musculoskeletal: No pain Neurologic: No headache or change in vision Skin: See above Remaining  ROS negative.  Physical Exam: Blood pressure 107/67, pulse 70, temperature 98.5 F (36.9 C), temperature source Oral, resp. rate 20, height 5\' 6"  (1.676 m), weight 150 lb (68.04 kg). Wt Readings from Last 3 Encounters:  07/02/12 150 lb (68.04 kg)  04/16/12 150 lb (68.04 kg)  02/20/12 149 lb 9.6 oz (67.858 kg)     General appearance: Well-nourished Caucasian woman HENNT: Pharynx no erythema or exudate Lymph nodes:  No cervical, supraclavicular, axillary, or inguinal adenopathy Breasts: Not examined Lungs: Clear to auscultation resonant to percussion Heart: Regular rhythm no murmur Abdomen: Soft, nontender, no mass, no organomegaly Extremities: No edema, no calf tenderness Vascular: No cyanosis Neurologic: No focal deficit Skin: 3 small 0.5 cm areas of hyperpigmentation on the left cheek which are nonspecific  Lab Results: Lab Results  Component Value Date   WBC 3.9 06/28/2012   HGB 13.4 06/28/2012   HCT 38.4 06/28/2012   MCV 93.5 06/28/2012   PLT 180 06/28/2012     Chemistry      Component Value Date/Time   NA 139 06/28/2012 1326   NA 143 02/17/2012 0952   K 4.0 06/28/2012 1326   K 4.6 02/17/2012 0952   CL 101 06/28/2012 1326   CL 101 02/17/2012 0952   CO2 30 06/28/2012 1326   CO2 27 02/17/2012 0952   BUN 14 06/28/2012 1326   BUN 17 02/17/2012 0952   CREATININE 0.75 06/28/2012 1326   CREATININE 1.0 02/17/2012 0952      Component Value Date/Time   CALCIUM 10.3 06/28/2012 1326   CALCIUM 9.0 02/17/2012 0952   ALKPHOS 28* 06/28/2012 1326   ALKPHOS 33  02/17/2012 0952   AST 21 06/28/2012 1326   AST 25 02/17/2012 0952   ALT 26 06/28/2012 1326   BILITOT 1.1 06/28/2012 1326   BILITOT 2.50* 02/17/2012 0952       Radiological Studies: Ct Soft Tissue Neck W Contrast  06/29/2012  *RADIOLOGY REPORT*  Clinical Data: Follow-up non-Hodgkins lymphoma  CT NECK WITH CONTRAST  Technique:  Multidetector CT imaging of the neck was performed with intravenous contrast.  Contrast: OMNIPAQUE  IOHEXOL 300 MG/ML  SOLN  Comparison: 02/17/2012  Findings: No evidence for recurrent lymphoma/neck adenopathy. Airway midline.  Major vascular structures patent.  No neck masses. No worrisome osseous lesions. Visualized paranasal sinuses clear. No mucosal lesions.  Major and minor salivary glands unremarkable.  IMPRESSION: Continued improvement non-Hodgkins lymphoma.  No significant neck adenopathy.   Original Report Authenticated By: Elsie Stain, M.D.    Ct Chest W Contrast  06/28/2012  *RADIOLOGY REPORT*  Clinical Data:  Follow up of lymphoma. B-cell lymphoma.  CT CHEST, ABDOMEN AND PELVIS WITH CONTRAST  Technique: Contiguous axial images of the chest abdomen and pelvis were obtained after IV contrast administration.  Contrast: 100  ml Omnipaque-300  Comparison: 02/17/2012  CT CHEST  Findings: Lung windows demonstrate no nodules or airspace opacities.  Soft tissue windows demonstrate decreased size of small left supraclavicular nodes.  Bovine arch.  Borderline cardiomegaly, accentuated by mild pectus excavatum deformity. No pericardial or pleural effusion.  No central pulmonary embolism, on this non-dedicated study.  No mediastinal or hilar adenopathy.  IMPRESSION:  1. No acute process or evidence of active lymphoma within the chest. 2. Please see neck CT, dictated separately.  CT ABDOMEN AND PELVIS  Findings:  Normal liver, spleen, stomach, pancreas, gallbladder, biliary tract, adrenal glands, left kidney. Punctate right renal lower pole calculus, nonobstructive.  No retroperitoneal or retrocrural adenopathy.  Colonic stool burden suggests constipation.  Normal terminal ileum.  Normal small bowel without abdominal ascites.    No pelvic adenopathy.  Suspect mild pelvic floor laxity/cystocele.  Otherwise normal appearance of the urinary bladder, uterus, ovaries/adnexa. No significant free fluid. Bilateral L5 pars defects again identified. No acute osseous abnormality.  IMPRESSION:  1. No acute process or  evidence of active lymphoma within the abdomen or pelvis.  2. Possible constipation. 3.  Punctate lower pole right renal calculus. 4.  Suspect mild pelvic floor laxity.   Original Report Authenticated By: Consuello Bossier, M.D.    Ct Abdomen Pelvis W Contrast  06/28/2012  *RADIOLOGY REPORT*  Clinical Data:  Follow up of lymphoma. B-cell lymphoma.  CT CHEST, ABDOMEN AND PELVIS WITH CONTRAST  Technique: Contiguous axial images of the chest abdomen and pelvis were obtained after IV contrast administration.  Contrast: 100  ml Omnipaque-300  Comparison: 02/17/2012  CT CHEST  Findings: Lung windows demonstrate no nodules or airspace opacities.  Soft tissue windows demonstrate decreased size of small left supraclavicular nodes.  Bovine arch.  Borderline cardiomegaly, accentuated by mild pectus excavatum deformity. No pericardial or pleural effusion.  No central pulmonary embolism, on this non-dedicated study.  No mediastinal or hilar adenopathy.  IMPRESSION:  1. No acute process or evidence of active lymphoma within the chest. 2. Please see neck CT, dictated separately.  CT ABDOMEN AND PELVIS  Findings:  Normal liver, spleen, stomach, pancreas, gallbladder, biliary tract, adrenal glands, left kidney. Punctate right renal lower pole calculus, nonobstructive.  No retroperitoneal or retrocrural adenopathy.  Colonic stool burden suggests constipation.  Normal terminal ileum.  Normal small bowel without abdominal  ascites.    No pelvic adenopathy.  Suspect mild pelvic floor laxity/cystocele.  Otherwise normal appearance of the urinary bladder, uterus, ovaries/adnexa. No significant free fluid. Bilateral L5 pars defects again identified. No acute osseous abnormality.  IMPRESSION:  1. No acute process or evidence of active lymphoma within the abdomen or pelvis.  2. Possible constipation. 3.  Punctate lower pole right renal calculus. 4.  Suspect mild pelvic floor laxity.   Original Report Authenticated By: Consuello Bossier, M.D.       Impression and Plan: #1. 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  She is in a complete clinical and radiographic remission at this time.  I'm going to space out the next CAT scan to a six-month interval and see her back in April 2014.   #2. Status post involved field radiation to the left neck for low-grade non-Hodgkin's lymphoma.  I will check thyroid functions at time of next lab analysis in 2 months to exclude hypothyroidism from prior radiation.   #3. Chronic leukopenia without absolute granulocytopenia stable over time and likely resulted previous chemotherapy. White count slowly improving up to 3900 today with normal differential   #4. Mild fluctuating hyperbilirubinemia. 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 Most recent bilirubin done on 06/28/2012 normal at 1.1  #5. Nonspecific skin changes left cheek. I told her to try some topical antifungal cream for a week and see if this makes any difference. If skin changes persist, she will call a dermatologist.    CC:. Dr. Loran Senters; Dr. Osborn Coho; Dr. Margaretmary Dys; Dr. Malva Limes   Levert Feinstein, MD 10/18/20135:47 PM

## 2012-07-05 ENCOUNTER — Telehealth: Payer: Self-pay | Admitting: Oncology

## 2012-07-05 NOTE — Telephone Encounter (Signed)
Talked to patient gave her appt for April 2014 lab and CT, pt opt for a water based contrast,NPO 4 hours prior to CT, pt will see MD after a few days of ct

## 2012-09-23 ENCOUNTER — Ambulatory Visit: Payer: 59 | Admitting: Radiation Oncology

## 2012-10-01 ENCOUNTER — Encounter: Payer: Self-pay | Admitting: Radiation Oncology

## 2012-10-07 ENCOUNTER — Other Ambulatory Visit: Payer: Self-pay | Admitting: Radiation Oncology

## 2012-10-07 ENCOUNTER — Encounter: Payer: Self-pay | Admitting: Radiation Oncology

## 2012-10-07 ENCOUNTER — Ambulatory Visit
Admission: RE | Admit: 2012-10-07 | Discharge: 2012-10-07 | Disposition: A | Discharge status: Home / Self Care | Payer: 59 | Source: Ambulatory Visit | Attending: Radiation Oncology | Admitting: Radiation Oncology

## 2012-10-07 VITALS — BP 109/73 | HR 60 | Temp 98.0°F | Resp 20 | Wt 154.8 lb

## 2012-10-07 DIAGNOSIS — C8590 Non-Hodgkin lymphoma, unspecified, unspecified site: Secondary | ICD-10-CM

## 2012-10-07 DIAGNOSIS — C8510 Unspecified B-cell lymphoma, unspecified site: Secondary | ICD-10-CM

## 2012-10-07 NOTE — Progress Notes (Signed)
  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  The patient denies any B. symptoms. She does note in frequent night sweats but these are non-drenching. Patient denies any neck pain, fullness, dysphagia, or aphasia. She has had imaging was previous year showed no evidence recurrence.                              ALLERGIES:   has no known allergies.  Meds: Current Outpatient Prescriptions  Medication Sig Dispense Refill  . fish oil-omega-3 fatty acids 1000 MG capsule Take 1 g by mouth daily.      . Multiple Vitamin (MULTIVITAMIN) tablet Take 1 tablet by mouth daily.         No current facility-administered medications for this encounter.   Facility-Administered Medications Ordered in Other Encounters  Medication Dose Route Frequency Provider Last Rate Last Dose  . hyaluronate sodium (RADIAPLEXRX) gel   Topical BID Oneita Hurt, MD        Physical Findings: The patient is in no acute distress. Patient is alert and oriented.  weight is 154 lb 12.8 oz (70.217 kg). Her temperature is 98 F (36.7 C). Her blood pressure is 109/73 and her pulse is 60. Her respiration is 20. . The patient's oral cavity and oropharynx are symmetric with no enlargement in the area of walldyers ring. The neck and supraclavicular region is free of adenopathy.  No significant changes.  LABS:    04/16/2012   TSH 1.786   Impression:  The patient has no evidence of recurrence and no evidence of post-radiation hypothyroidism.  Plan:  Today, I talked the patient a long-term followup and talked to her about the risk for  hypothyroidism. She understands the importance of annual TSH monitoring and plans to follow closely with Dr. Cyndie Chime. I encouraged her to call our office if she has any questions about her radiation treatment, lymphoma, or subsequent management. Otherwise, she will followup with me an as-needed basis  _____________________________________  Artist Pais. Kathrynn Running, M.D.

## 2012-10-07 NOTE — Progress Notes (Signed)
Pt denies pain, fatigue, loss of appetite, dry mouth. 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

## 2012-12-24 ENCOUNTER — Other Ambulatory Visit: Payer: 59 | Admitting: Lab

## 2012-12-31 ENCOUNTER — Other Ambulatory Visit (HOSPITAL_BASED_OUTPATIENT_CLINIC_OR_DEPARTMENT_OTHER): Payer: 59 | Admitting: Lab

## 2012-12-31 ENCOUNTER — Ambulatory Visit (HOSPITAL_COMMUNITY)
Admission: RE | Admit: 2012-12-31 | Discharge: 2012-12-31 | Disposition: A | Discharge status: Home / Self Care | Payer: 59 | Source: Ambulatory Visit | Attending: Oncology | Admitting: Oncology

## 2012-12-31 ENCOUNTER — Other Ambulatory Visit: Payer: Self-pay | Admitting: Oncology

## 2012-12-31 DIAGNOSIS — C8590 Non-Hodgkin lymphoma, unspecified, unspecified site: Secondary | ICD-10-CM

## 2012-12-31 DIAGNOSIS — C8510 Unspecified B-cell lymphoma, unspecified site: Secondary | ICD-10-CM

## 2012-12-31 DIAGNOSIS — C8589 Other specified types of non-Hodgkin lymphoma, extranodal and solid organ sites: Secondary | ICD-10-CM | POA: Insufficient documentation

## 2012-12-31 DIAGNOSIS — K7689 Other specified diseases of liver: Secondary | ICD-10-CM | POA: Insufficient documentation

## 2012-12-31 LAB — CBC WITH DIFFERENTIAL/PLATELET
Basophils Absolute: 0 10*3/uL (ref 0.0–0.1)
Eosinophils Absolute: 0.1 10*3/uL (ref 0.0–0.5)
HCT: 37.5 % (ref 34.8–46.6)
HGB: 13.1 g/dL (ref 11.6–15.9)
MONO#: 0.2 10*3/uL (ref 0.1–0.9)
NEUT#: 1.5 10*3/uL (ref 1.5–6.5)
NEUT%: 52.6 % (ref 38.4–76.8)
RDW: 13 % (ref 11.2–14.5)
lymph#: 1 10*3/uL (ref 0.9–3.3)

## 2012-12-31 LAB — COMPREHENSIVE METABOLIC PANEL (CC13)
Albumin: 4.1 g/dL (ref 3.5–5.0)
Alkaline Phosphatase: 22 U/L — ABNORMAL LOW (ref 40–150)
CO2: 24 mEq/L (ref 22–29)
Calcium: 9.7 mg/dL (ref 8.4–10.4)
Chloride: 104 mEq/L (ref 98–107)
Glucose: 93 mg/dl (ref 70–99)
Potassium: 4.2 mEq/L (ref 3.5–5.1)
Sodium: 136 mEq/L (ref 136–145)
Total Protein: 6.9 g/dL (ref 6.4–8.3)

## 2012-12-31 LAB — LACTATE DEHYDROGENASE (CC13): LDH: 169 U/L (ref 125–245)

## 2012-12-31 LAB — TSH: TSH: 2.419 u[IU]/mL (ref 0.350–4.500)

## 2012-12-31 MED ORDER — IOHEXOL 300 MG/ML  SOLN
50.0000 mL | Freq: Once | INTRAMUSCULAR | Status: AC | PRN
  Administered 2012-12-31: 50 mL via ORAL

## 2012-12-31 MED ORDER — IOHEXOL 300 MG/ML  SOLN
100.0000 mL | Freq: Once | INTRAMUSCULAR | Status: AC | PRN
  Administered 2012-12-31: 100 mL via INTRAVENOUS

## 2013-01-04 ENCOUNTER — Ambulatory Visit (HOSPITAL_BASED_OUTPATIENT_CLINIC_OR_DEPARTMENT_OTHER): Payer: 59 | Admitting: Oncology

## 2013-01-04 ENCOUNTER — Telehealth: Payer: Self-pay | Admitting: Oncology

## 2013-01-04 VITALS — BP 108/68 | HR 59 | Temp 97.6°F | Resp 20 | Ht 66.0 in | Wt 151.4 lb

## 2013-01-04 DIAGNOSIS — C8510 Unspecified B-cell lymphoma, unspecified site: Secondary | ICD-10-CM

## 2013-01-04 DIAGNOSIS — C8581 Other specified types of non-Hodgkin lymphoma, lymph nodes of head, face, and neck: Secondary | ICD-10-CM

## 2013-01-04 DIAGNOSIS — D708 Other neutropenia: Secondary | ICD-10-CM

## 2013-01-04 DIAGNOSIS — C8590 Non-Hodgkin lymphoma, unspecified, unspecified site: Secondary | ICD-10-CM

## 2013-01-04 DIAGNOSIS — D702 Other drug-induced agranulocytosis: Secondary | ICD-10-CM

## 2013-01-04 NOTE — Progress Notes (Signed)
Hematology and Oncology Follow Up Visit  Katelyn Irwin [id number] 1966-03-30 46 y.o. 01/04/2013 7:05 PM   Principle Diagnosis: Encounter Diagnoses  Name Primary?  . Low grade B-cell lymphoma Yes  . High grade malignant lymphoma   . Drug-induced leukopenia   . Congenital hyperbilirubinemia      Interim History:    Followup visit for this pleasant 47 year old woman who initially presented with pelvic pain with findings of bulky pelvic lymphadenopathy. 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 She was initially treated with 6 cycles of CHOP Rituxan and achieved a complete response.  On a routine followup CT scan of the neck and chest done in September 2012 she was found to have a cluster of small left supraclavicular lymph nodes enlarged. These had low level activity on a PET scan which was obscured by surrounding brown fat. There was no other uptake to suggest additional areas of disease. A lymph node biopsy was done and in fact showed that the lymph nodes contained low-grade lymphoma and not recurrent high-grade disease.  She was treated with single agent Rituxan plus involved field radiation. She then received  maintenance Rituxan  one dose every 2 months for 4 treatments. She completed all planned treatments on 04/16/2012.  She used to do well. She has had no interim medical problems. No infections this winter.  Medications: reviewed  Allergies: No Known Allergies  Review of Systems: Constitutional:   No constitutional symptoms Respiratory: No cough or dyspnea Cardiovascular:  No chest pain or palpitations Gastrointestinal: No change in bowel habit Genito-Urinary: Menstrual cycle stopped when she was given chemotherapy and have not returned Musculoskeletal: No muscle bone or joint pain Neurologic: No headache or change in vision Skin: No rash or ecchymosis Remaining ROS negative.  Physical Exam: Blood pressure 108/68, pulse 59,  temperature 97.6 F (36.4 C), temperature source Oral, resp. rate 20, height 5\' 6"  (1.676 m), weight 151 lb 6.4 oz (68.675 kg). Wt Readings from Last 3 Encounters:  01/04/13 151 lb 6.4 oz (68.675 kg)  07/02/12 150 lb (68.04 kg)  04/16/12 150 lb (68.04 kg)     General appearance: Well-nourished Caucasian woman HENNT: Pharynx no erythema or exudate Lymph nodes: No cervical, supraclavicular, or axillary adenopathy Breasts: Lungs: Clear to auscultation resonant to percussion Heart: Regular rhythm no murmur Abdomen: Soft, nontender, no mass, no organomegaly Extremities: No edema, no calf tenderness Vascular: No cyanosis Neurologic: No focal deficit Skin: No rash or ecchymosis  Lab Results: Lab Results  Component Value Date   WBC 2.9* 12/31/2012   HGB 13.1 12/31/2012   HCT 37.5 12/31/2012   MCV 91.9 12/31/2012   PLT 171 12/31/2012     Chemistry      Component Value Date/Time   NA 136 12/31/2012 1016   NA 139 06/28/2012 1326   NA 143 02/17/2012 0952   K 4.2 12/31/2012 1016   K 4.0 06/28/2012 1326   K 4.6 02/17/2012 0952   CL 104 12/31/2012 1016   CL 101 06/28/2012 1326   CL 101 02/17/2012 0952   CO2 24 12/31/2012 1016   CO2 30 06/28/2012 1326   CO2 27 02/17/2012 0952   BUN 15.5 12/31/2012 1016   BUN 14 06/28/2012 1326   BUN 17 02/17/2012 0952   CREATININE 0.8 12/31/2012 1016   CREATININE 0.75 06/28/2012 1326   CREATININE 1.0 02/17/2012 0952      Component Value Date/Time   CALCIUM 9.7 12/31/2012 1016   CALCIUM 10.3 06/28/2012 1326  CALCIUM 9.0 02/17/2012 0952   ALKPHOS 22* 12/31/2012 1016   ALKPHOS 28* 06/28/2012 1326   ALKPHOS 33 02/17/2012 0952   AST 24 12/31/2012 1016   AST 21 06/28/2012 1326   AST 25 02/17/2012 0952   ALT 30 12/31/2012 1016   ALT 26 06/28/2012 1326   BILITOT 1.60* 12/31/2012 1016   BILITOT 1.1 06/28/2012 1326   BILITOT 2.50* 02/17/2012 0952       Radiological Studies: Ct Chest W Contrast  I personally reviewed these images  12/31/2012  *RADIOLOGY REPORT*  Clinical  Data:  Non-Hodgkins lymphoma.  CT CHEST, ABDOMEN AND PELVIS WITH CONTRAST  Technique:  Multidetector CT imaging of the chest, abdomen and pelvis was performed following the standard protocol during bolus administration of intravenous contrast.  Contrast: OMNIPAQUE IOHEXOL 300 MG/ML  SOLN  Comparison:  06/28/2012.  CT CHEST  Findings:  The chest wall is unremarkable.  No breast masses, supraclavicular or axillary lymphadenopathy.  The thyroid gland is normal.  The bony thorax is intact.  The heart is normal in size.  No pericardial effusion.  No mediastinal or hilar lymphadenopathy.  The aorta is normal in caliber.  No dissection.  The major branch vessels are patent.  The esophagus is normal.  The pulmonary arteries appear normal.  Examination of the lung parenchyma demonstrates no acute pulmonary findings.  No pulmonary nodules or worrisome pulmonary mass lesions.  No interstitial lung disease or pleural effusion.  IMPRESSION: Unremarkable and stable CT appearance of the chest.  No lymphadenopathy.  CT ABDOMEN AND PELVIS  Findings:  The liver demonstrates mild diffuse fatty infiltration but no focal hepatic lesions or intrahepatic biliary dilatation. The gallbladder is normal.  No common bile duct dilatation.  The pancreas is normal.  The spleen is normal in size.  No focal lesions.  The adrenal glands and kidneys are normal and stable.  The stomach, duodenum, small bowel and colon are unremarkable.  No inflammatory changes or mass lesions.  Low lying cecum deep in the pelvis.  The terminal ileum is normal.  No mesenteric or retroperitoneal lymphadenopathy or mass.  The aorta is normal in caliber.  The major branch vessels are normal.  The uterus and ovaries are unremarkable.  No pelvic mass, adenopathy or free pelvic fluid collections.  No inguinal mass or adenopathy.  The bony structures are unremarkable and stable.  IMPRESSION:  Unremarkable CT abdomen/pelvis.  No lymphadenopathy.   Original Report  Authenticated By: Rudie Meyer, M.D.    Ct Abdomen Pelvis W Contrast  12/31/2012  *RADIOLOGY REPORT*  Clinical Data:  Non-Hodgkins lymphoma.  CT CHEST, ABDOMEN AND PELVIS WITH CONTRAST  Technique:  Multidetector CT imaging of the chest, abdomen and pelvis was performed following the standard protocol during bolus administration of intravenous contrast.  Contrast: OMNIPAQUE IOHEXOL 300 MG/ML  SOLN  Comparison:  06/28/2012.  CT CHEST  Findings:  The chest wall is unremarkable.  No breast masses, supraclavicular or axillary lymphadenopathy.  The thyroid gland is normal.  The bony thorax is intact.  The heart is normal in size.  No pericardial effusion.  No mediastinal or hilar lymphadenopathy.  The aorta is normal in caliber.  No dissection.  The major branch vessels are patent.  The esophagus is normal.  The pulmonary arteries appear normal.  Examination of the lung parenchyma demonstrates no acute pulmonary findings.  No pulmonary nodules or worrisome pulmonary mass lesions.  No interstitial lung disease or pleural effusion.  IMPRESSION: Unremarkable and stable CT appearance  of the chest.  No lymphadenopathy.  CT ABDOMEN AND PELVIS  Findings:  The liver demonstrates mild diffuse fatty infiltration but no focal hepatic lesions or intrahepatic biliary dilatation. The gallbladder is normal.  No common bile duct dilatation.  The pancreas is normal.  The spleen is normal in size.  No focal lesions.  The adrenal glands and kidneys are normal and stable.  The stomach, duodenum, small bowel and colon are unremarkable.  No inflammatory changes or mass lesions.  Low lying cecum deep in the pelvis.  The terminal ileum is normal.  No mesenteric or retroperitoneal lymphadenopathy or mass.  The aorta is normal in caliber.  The major branch vessels are normal.  The uterus and ovaries are unremarkable.  No pelvic mass, adenopathy or free pelvic fluid collections.  No inguinal mass or adenopathy.  The bony structures are  unremarkable and stable.  IMPRESSION:  Unremarkable CT abdomen/pelvis.  No lymphadenopathy.   Original Report Authenticated By: Rudie Meyer, M.D.     Impression and Plan: #1. Initial high grade B-cell non-Hodgkin's lymphoma treated with CHOP-Rituxan No evidence for new disease now out over 4 years  #2. 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 Treated with involved field radiation plus Rituxan. No evidence for new disease now out 1-1/2 years.  #3. Chronic leukopenia Likely result of previous chemotherapy. Normal white count differential. No recurrent infections.  I will see her again in 6 months and repeat labs and scans at that time.   CC:. Dr. Loran Senters; Dr. Margaretmary Dys; Dr. Malva Limes   Levert Feinstein, MD 4/22/20147:05 PM

## 2013-01-04 NOTE — Telephone Encounter (Signed)
gv pt appt schedule for October. Pt aware [facility] will call w/ct appt.

## 2013-04-25 ENCOUNTER — Telehealth: Payer: Self-pay | Admitting: Family Medicine

## 2013-04-25 MED ORDER — MECLIZINE HCL 25 MG PO TABS: 25.0000 mg | ORAL_TABLET | Freq: Four times a day (QID) | ORAL | Status: DC | PRN

## 2013-04-25 NOTE — Telephone Encounter (Signed)
Patient states she would like a refill on her Meclizine 25MG  due to vertigo.  States Dr. Brett Canales informed her if she needed this medication filled to let him know and he would fill.  [facility].  Please call Patient. Thanks

## 2013-04-25 NOTE — Telephone Encounter (Signed)
Rx sent into pharmacy. Patient was notified.

## 2013-04-25 NOTE — Telephone Encounter (Signed)
Numb thirty two ref. One q 6 hrs prn

## 2013-06-07 ENCOUNTER — Telehealth: Payer: Self-pay | Admitting: *Deleted

## 2013-06-07 NOTE — Telephone Encounter (Signed)
Patient called regarding her appts for this month. I have given her dates/times

## 2013-06-30 ENCOUNTER — Encounter (HOSPITAL_COMMUNITY): Payer: Self-pay

## 2013-06-30 ENCOUNTER — Encounter (INDEPENDENT_AMBULATORY_CARE_PROVIDER_SITE_OTHER): Payer: Self-pay

## 2013-06-30 ENCOUNTER — Ambulatory Visit (HOSPITAL_COMMUNITY)
Admission: RE | Admit: 2013-06-30 | Discharge: 2013-06-30 | Disposition: A | Discharge status: Home / Self Care | Payer: 59 | Source: Ambulatory Visit | Attending: Oncology | Admitting: Oncology

## 2013-06-30 ENCOUNTER — Ambulatory Visit (HOSPITAL_COMMUNITY): Payer: 59

## 2013-06-30 ENCOUNTER — Other Ambulatory Visit (HOSPITAL_BASED_OUTPATIENT_CLINIC_OR_DEPARTMENT_OTHER): Payer: 59 | Admitting: Lab

## 2013-06-30 ENCOUNTER — Other Ambulatory Visit: Payer: Self-pay | Admitting: Oncology

## 2013-06-30 DIAGNOSIS — C8510 Unspecified B-cell lymphoma, unspecified site: Secondary | ICD-10-CM

## 2013-06-30 DIAGNOSIS — C8589 Other specified types of non-Hodgkin lymphoma, extranodal and solid organ sites: Secondary | ICD-10-CM | POA: Insufficient documentation

## 2013-06-30 DIAGNOSIS — D702 Other drug-induced agranulocytosis: Secondary | ICD-10-CM

## 2013-06-30 DIAGNOSIS — C8590 Non-Hodgkin lymphoma, unspecified, unspecified site: Secondary | ICD-10-CM

## 2013-06-30 DIAGNOSIS — C8581 Other specified types of non-Hodgkin lymphoma, lymph nodes of head, face, and neck: Secondary | ICD-10-CM

## 2013-06-30 LAB — CBC WITH DIFFERENTIAL/PLATELET
BASO%: 0.7 % (ref 0.0–2.0)
EOS%: 6.8 % (ref 0.0–7.0)
HCT: 37.9 % (ref 34.8–46.6)
LYMPH%: 35.5 % (ref 14.0–49.7)
MCH: 31.9 pg (ref 25.1–34.0)
MCHC: 34.7 g/dL (ref 31.5–36.0)
NEUT%: 49.5 % (ref 38.4–76.8)
RBC: 4.12 10*6/uL (ref 3.70–5.45)
lymph#: 1.2 10*3/uL (ref 0.9–3.3)

## 2013-06-30 LAB — SEDIMENTATION RATE: Sed Rate: 4 mm/hr (ref 0–22)

## 2013-06-30 LAB — COMPREHENSIVE METABOLIC PANEL (CC13)
Alkaline Phosphatase: 23 U/L — ABNORMAL LOW (ref 40–150)
BUN: 14.4 mg/dL (ref 7.0–26.0)
Glucose: 95 mg/dl (ref 70–140)
Sodium: 144 mEq/L (ref 136–145)
Total Bilirubin: 1.96 mg/dL — ABNORMAL HIGH (ref 0.20–1.20)

## 2013-06-30 MED ORDER — IOHEXOL 300 MG/ML  SOLN
50.0000 mL | Freq: Once | INTRAMUSCULAR | Status: AC | PRN
  Administered 2013-06-30: 50 mL via ORAL

## 2013-06-30 MED ORDER — IOHEXOL 300 MG/ML  SOLN
100.0000 mL | Freq: Once | INTRAMUSCULAR | Status: AC | PRN
  Administered 2013-06-30: 100 mL via INTRAVENOUS

## 2013-07-01 ENCOUNTER — Telehealth: Payer: Self-pay | Admitting: *Deleted

## 2013-07-01 NOTE — Telephone Encounter (Signed)
Per Dr. Cyndie Chime; notified pt CT negative for lymphoma.  Pt verbalized understanding and expressed appreciation for call.

## 2013-07-01 NOTE — Telephone Encounter (Signed)
Message copied by Gala Romney on Fri Jul 01, 2013 12:02 PM ------      Message from: [place], [place]      Created: Fri Jul 01, 2013  9:21 AM                   ----- Message -----         From: Levert Feinstein, MD         Sent: 06/30/2013   3:36 PM           To: Rana Snare, NP            Call pt - CT all negative for lymphoma ------

## 2013-07-04 ENCOUNTER — Telehealth: Payer: Self-pay | Admitting: Oncology

## 2013-07-04 ENCOUNTER — Ambulatory Visit (HOSPITAL_BASED_OUTPATIENT_CLINIC_OR_DEPARTMENT_OTHER): Payer: 59 | Admitting: Oncology

## 2013-07-04 VITALS — BP 102/62 | HR 59 | Temp 97.9°F | Resp 18 | Ht 66.0 in | Wt 151.7 lb

## 2013-07-04 DIAGNOSIS — D72819 Decreased white blood cell count, unspecified: Secondary | ICD-10-CM

## 2013-07-04 DIAGNOSIS — C8590 Non-Hodgkin lymphoma, unspecified, unspecified site: Secondary | ICD-10-CM

## 2013-07-04 DIAGNOSIS — D702 Other drug-induced agranulocytosis: Secondary | ICD-10-CM

## 2013-07-04 DIAGNOSIS — C8510 Unspecified B-cell lymphoma, unspecified site: Secondary | ICD-10-CM

## 2013-07-04 DIAGNOSIS — C8589 Other specified types of non-Hodgkin lymphoma, extranodal and solid organ sites: Secondary | ICD-10-CM

## 2013-07-04 NOTE — Telephone Encounter (Signed)
Gave pt appt for lab and Md for [facility] 2015 °

## 2013-07-07 NOTE — Progress Notes (Signed)
Hematology and Oncology Follow Up Visit  STEPHEN TURNBAUGH [id number] 02/29/1966 47 y.o. 07/07/2013 9:26 AM   Principle Diagnosis: Encounter Diagnoses  Name Primary?  . Low grade B-cell lymphoma Yes  . High grade malignant lymphoma   . Congenital hyperbilirubinemia   . Drug-induced leukopenia      Interim History:   Followup visit for this pleasant 47 year old woman who initially presented with pelvic pain with findings of bulky pelvic lymphadenopathy. 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 She was initially treated with 6 cycles of CHOP Rituxan and achieved a complete response.  On a routine followup CT scan of the neck and chest done in September 2012 she was found to have a cluster of small left supraclavicular lymph nodes enlarged. These had low level activity on a PET scan which was obscured by surrounding brown fat. There was no other uptake to suggest additional areas of disease. A lymph node biopsy was done and in fact showed that the lymph nodes contained low-grade lymphoma and not recurrent high-grade disease.  She was treated with single agent Rituxan plus involved field radiation. She then received maintenance Rituxan one dose every 2 months for 4 treatments. She completed all planned treatments on 04/16/2012.  Overall she is doing well. She had some transient right lower coordination of abdominal discomfort radiating to her groin about a week ago which lasted for a few days and then resolved on its own. She was just getting ready to call me since these were similar symptoms that she had at the time of diagnosis of her cancer. CT scan neck, chest, abdomen and pelvis were done in anticipation of today's visit on 06/30/2013 and I personally reviewed these images with her and her husband today. There is no evidence for recurrent disease at any site.   Medications: reviewed  Allergies: No Known Allergies  Review of Systems: Hematology:  negative for swollen glands, ENT ROS: negative for - oral lesions or sore throat Breast ROS: Respiratory ROS: negative for - cough, pleuritic pain, shortness of breath or wheezing Cardiovascular ROS: negative for - chest pain, dyspnea on exertion, edema, irregular heartbeat, murmur, orthopnea, palpitations, paroxysmal nocturnal dyspnea or rapid heart rate Gastrointestinal ROS: negative for - abdominal pain, appetite loss, blood in stools, change in bowel habits, constipation, diarrhea, heartburn, hematemesis, melena, nausea/vomiting or swallowing difficulty/pain Genito-Urinary ROS: negative for - change in menstrual cycle, dysuria, hematuria, incontinence, irregular/heavy menses, nocturia or urinary frequency/urgency Musculoskeletal ROS: negative for - joint pain, joint stiffness, joint swelling, muscle pain, muscular weakness  Neurological ROS: negative for - behavioral changes, confusion, dizziness, gait disturbance, headaches, impaired coordination/balance, memory loss, numbness/tingling, Dermatological ROS: negative for rash, ecchymosis Remaining ROS negative.  Physical Exam: Blood pressure 102/62, pulse 59, temperature 97.9 F (36.6 C), temperature source Oral, resp. rate 18, height 5\' 6"  (1.676 m), weight 151 lb 11.2 oz (68.811 kg). Wt Readings from Last 3 Encounters:  07/04/13 151 lb 11.2 oz (68.811 kg)  01/04/13 151 lb 6.4 oz (68.675 kg)  07/02/12 150 lb (68.04 kg)     General appearance: Well-nourished Caucasian woman HENNT: Pharynx no erythema, exudate, mass, or ulcer. No thyromegaly or thyroid nodules Lymph nodes: No cervical, supraclavicular, or axillary lymphadenopathy Breasts:  Lungs: Clear to auscultation, resonant to percussion throughout Heart: Regular rhythm, no murmur, no gallop, no rub, no click, no edema Abdomen: Soft, nontender, normal bowel sounds, no mass, no organomegaly Extremities: No edema, no calf tenderness Musculoskeletal: no joint deformities GU:   Vascular: Carotid  pulses 2+, no bruits, distal pulses: Dorsalis pedis 1+ symmetric Neurologic: Alert, oriented, PERRLA, optic discs sharp and vessels normal, no hemorrhage or exudate, cranial nerves grossly normal, motor strength 5 over 5, reflexes 1+ symmetric, upper body coordination normal, gait normal, Skin: No rash or ecchymosis  Lab Results: CBC W/Diff  White count differential: 50% neutrophils, 36% lymphocytes, 8% monocytes, 7% eosinophils   Component Value Date/Time   WBC 3.4* 06/30/2013 0837   WBC 4.5 06/30/2011 1351   RBC 4.12 06/30/2013 0837   RBC 4.13 06/30/2011 1351   HGB 13.1 06/30/2013 0837   HGB 13.1 06/30/2011 1351   HCT 37.9 06/30/2013 0837   HCT 37.8 06/30/2011 1351   PLT 161 06/30/2013 0837   PLT 164 06/30/2011 1351   MCV 92.0 06/30/2013 0837   MCV 91.5 06/30/2011 1351   MCH 31.9 06/30/2013 0837   MCH 31.7 06/30/2011 1351   MCHC 34.7 06/30/2013 0837   MCHC 34.7 06/30/2011 1351   RDW 12.6 06/30/2013 0837   RDW 12.5 06/30/2011 1351   LYMPHSABS 1.2 06/30/2013 0837   LYMPHSABS 0.9 10/16/2008 0730   MONOABS 0.3 06/30/2013 0837   MONOABS 0.4 10/16/2008 0730   EOSABS 0.2 06/30/2013 0837   EOSABS 0.1 10/16/2008 0730   BASOSABS 0.0 06/30/2013 0837   BASOSABS 0.0 10/16/2008 0730     Chemistry      Component Value Date/Time   NA 144 06/30/2013 0837   NA 139 06/28/2012 1326   NA 143 02/17/2012 0952   K 4.4 06/30/2013 0837   K 4.0 06/28/2012 1326   K 4.6 02/17/2012 0952   CL 104 12/31/2012 1016   CL 101 06/28/2012 1326   CL 101 02/17/2012 0952   CO2 29 06/30/2013 0837   CO2 30 06/28/2012 1326   CO2 27 02/17/2012 0952   BUN 14.4 06/30/2013 0837   BUN 14 06/28/2012 1326   BUN 17 02/17/2012 0952   CREATININE 0.8 06/30/2013 0837   CREATININE 0.75 06/28/2012 1326   CREATININE 1.0 02/17/2012 0952      Component Value Date/Time   CALCIUM 10.2 06/30/2013 0837   CALCIUM 10.3 06/28/2012 1326   CALCIUM 9.0 02/17/2012 0952   ALKPHOS 23* 06/30/2013 0837   ALKPHOS 28* 06/28/2012  1326   ALKPHOS 33 02/17/2012 0952   AST 22 06/30/2013 0837   AST 21 06/28/2012 1326   AST 25 02/17/2012 0952   ALT 32 06/30/2013 0837   ALT 26 06/28/2012 1326   ALT 35 02/17/2012 0952   BILITOT 1.96* 06/30/2013 0837   BILITOT 1.1 06/28/2012 1326   BILITOT 2.50* 02/17/2012 0952       Radiological Studies: Ct Soft Tissue Neck W Contrast  06/30/2013   CLINICAL DATA:  47 year old female with history of lymphoma, thought to be in remission. Subsequent encounter.  EXAM: CT NECK WITH CONTRAST  TECHNIQUE: Multidetector CT imaging of the neck was performed using the standard protocol following the bolus administration of intravenous contrast.  CONTRAST:  50mL OMNIPAQUE IOHEXOL 300 MG/ML SOLN, OMNIPAQUE IOHEXOL 300 MG/ML SOLN in conjunction with CT(s) of the chest, abdomen, and pelvis reported separately.  COMPARISON:  06/28/2012 and earlier.  FINDINGS: Chest findings reported separately.  Thyroid, larynx, pharynx, parapharyngeal spaces, retropharyngeal space, sublingual space, submandibular glands, parotid glands, and visualized orbits soft tissues. Negative visualized brain parenchyma.  Major vascular structures in the neck and at the skullbase are patent and within normal limits.  Cervical lymph nodes remains small and within normal limits. No abnormal or increasing lymph  node identified.  Visualized paranasal sinuses and mastoids are clear. Previous postoperative changes to the walls of the maxillary sinuses again noted. No acute osseous abnormality identified.  IMPRESSION: 1. Stable and negative CT appearance of the neck. No lymphadenopathy.  2.  Chest abdomen and pelvis findings reported separately.   Electronically Signed   By: Augusto Gamble M.D.   On: 06/30/2013 14:13   Ct Chest W Contrast  06/30/2013   CLINICAL DATA:  Lymphoma  EXAM: CT CHEST, ABDOMEN, AND PELVIS WITH CONTRAST  TECHNIQUE: Multidetector CT imaging of the chest, abdomen and pelvis was performed following the standard protocol during  bolus administration of intravenous contrast.  CONTRAST:  50mL OMNIPAQUE IOHEXOL 300 MG/ML SOLN, OMNIPAQUE IOHEXOL 300 MG/ML SOLN  COMPARISON:  12/31/2012  FINDINGS: CT CHEST FINDINGS  No pleural effusion. No airspace consolidation identified. No pulmonary parenchymal nodule or mass identified. There is no enlarged mediastinal or hilar lymph nodes. No pericardial effusion identified. No supraclavicular or axillary adenopathy identified. The thyroid gland appears normal.  CT ABDOMEN AND PELVIS FINDINGS  No suspicious liver abnormality identified. The gallbladder appears normal. No biliary dilatation. Normal appearance of the pancreas. The spleen is unremarkable.  The adrenal glands are both normal. The kidneys are both on unremarkable. The urinary bladder appears within normal limits. The uterus and adnexal structures are both negative.  Normal caliber of the abdominal aorta. There is no upper abdominal adenopathy. No pelvic or inguinal adenopathy identified. The stomach is within normal limits. The small bowel loops are also within normal limits. Normal appearance of the colon.  No free fluid or abnormal fluid collections identified within the abdomen or pelvis. Review of the visualized osseous structures is negative for aggressive lytic or sclerotic bone lesions. Bilateral L5 pars defects are identified.  IMPRESSION: 1. No mass or adenopathy identified within the abdomen or pelvis.   Electronically Signed   By: Signa Kell M.D.   On: 06/30/2013 11:49   Ct Abdomen Pelvis W Contrast  06/30/2013   CLINICAL DATA:  Lymphoma  EXAM: CT CHEST, ABDOMEN, AND PELVIS WITH CONTRAST  TECHNIQUE: Multidetector CT imaging of the chest, abdomen and pelvis was performed following the standard protocol during bolus administration of intravenous contrast.  CONTRAST:  50mL OMNIPAQUE IOHEXOL 300 MG/ML SOLN, OMNIPAQUE IOHEXOL 300 MG/ML SOLN  COMPARISON:  12/31/2012  FINDINGS: CT CHEST FINDINGS  No pleural effusion. No  airspace consolidation identified. No pulmonary parenchymal nodule or mass identified. There is no enlarged mediastinal or hilar lymph nodes. No pericardial effusion identified. No supraclavicular or axillary adenopathy identified. The thyroid gland appears normal.  CT ABDOMEN AND PELVIS FINDINGS  No suspicious liver abnormality identified. The gallbladder appears normal. No biliary dilatation. Normal appearance of the pancreas. The spleen is unremarkable.  The adrenal glands are both normal. The kidneys are both on unremarkable. The urinary bladder appears within normal limits. The uterus and adnexal structures are both negative.  Normal caliber of the abdominal aorta. There is no upper abdominal adenopathy. No pelvic or inguinal adenopathy identified. The stomach is within normal limits. The small bowel loops are also within normal limits. Normal appearance of the colon.  No free fluid or abnormal fluid collections identified within the abdomen or pelvis. Review of the visualized osseous structures is negative for aggressive lytic or sclerotic bone lesions. Bilateral L5 pars defects are identified.  IMPRESSION: 1. No mass or adenopathy identified within the abdomen or pelvis.   Electronically Signed   By: Veronda Prude.D.  On: 06/30/2013 11:49    Impression:  #1. Initial high grade B-cell non-Hodgkin's lymphoma treated with CHOP-Rituxan  No evidence for new disease now almost 5 years from diagnosis in January 2010. Hopefully she is cured of the high-grade lymphoma.   #2. 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  Treated with involved field radiation plus Rituxan.  No evidence for new disease now out 2 years from diagnosis in September 2012.   #3. Chronic leukopenia  Likely result of previous chemotherapy. Normal white count differential. No recurrent infections.    I will see her again in 6 months and repeat labs and scans at that time.    CC: Patient  Care Team: Merlyn Albert, MD as PCP - General (Family Medicine)   Levert Feinstein, MD 10/23/20149:26 AM

## 2013-09-19 ENCOUNTER — Other Ambulatory Visit: Payer: Self-pay | Admitting: Obstetrics and Gynecology

## 2013-10-17 ENCOUNTER — Telehealth: Payer: Self-pay | Admitting: *Deleted

## 2013-10-17 NOTE — Telephone Encounter (Signed)
sw pt made her aware that G will be on call 12/02/13. gv appt for 11/30/13 @ 3pm. Pt is aware...td

## 2013-11-12 ENCOUNTER — Encounter: Payer: Self-pay | Admitting: Oncology

## 2013-11-30 ENCOUNTER — Ambulatory Visit (HOSPITAL_BASED_OUTPATIENT_CLINIC_OR_DEPARTMENT_OTHER): Payer: 59 | Admitting: Oncology

## 2013-11-30 ENCOUNTER — Other Ambulatory Visit (HOSPITAL_BASED_OUTPATIENT_CLINIC_OR_DEPARTMENT_OTHER): Payer: 59

## 2013-11-30 ENCOUNTER — Ambulatory Visit (HOSPITAL_COMMUNITY)
Admission: RE | Admit: 2013-11-30 | Discharge: 2013-11-30 | Disposition: A | Discharge status: Home / Self Care | Payer: 59 | Source: Ambulatory Visit | Attending: Oncology | Admitting: Oncology

## 2013-11-30 ENCOUNTER — Telehealth: Payer: Self-pay | Admitting: Oncology

## 2013-11-30 VITALS — BP 103/65 | HR 67 | Temp 98.0°F | Resp 18 | Ht 66.0 in | Wt 154.7 lb

## 2013-11-30 DIAGNOSIS — D708 Other neutropenia: Secondary | ICD-10-CM

## 2013-11-30 DIAGNOSIS — C8581 Other specified types of non-Hodgkin lymphoma, lymph nodes of head, face, and neck: Secondary | ICD-10-CM

## 2013-11-30 DIAGNOSIS — C8510 Unspecified B-cell lymphoma, unspecified site: Secondary | ICD-10-CM

## 2013-11-30 DIAGNOSIS — C8589 Other specified types of non-Hodgkin lymphoma, extranodal and solid organ sites: Secondary | ICD-10-CM | POA: Insufficient documentation

## 2013-11-30 DIAGNOSIS — C8590 Non-Hodgkin lymphoma, unspecified, unspecified site: Secondary | ICD-10-CM

## 2013-11-30 DIAGNOSIS — D72819 Decreased white blood cell count, unspecified: Secondary | ICD-10-CM | POA: Insufficient documentation

## 2013-11-30 DIAGNOSIS — D702 Other drug-induced agranulocytosis: Secondary | ICD-10-CM

## 2013-11-30 LAB — CBC WITH DIFFERENTIAL/PLATELET
BASO%: 0.9 % (ref 0.0–2.0)
Basophils Absolute: 0 10*3/uL (ref 0.0–0.1)
EOS%: 8.9 % — ABNORMAL HIGH (ref 0.0–7.0)
Eosinophils Absolute: 0.3 10*3/uL (ref 0.0–0.5)
HEMATOCRIT: 39.4 % (ref 34.8–46.6)
HGB: 13.4 g/dL (ref 11.6–15.9)
LYMPH%: 45.4 % (ref 14.0–49.7)
MCH: 31.3 pg (ref 25.1–34.0)
MCHC: 33.9 g/dL (ref 31.5–36.0)
MCV: 92.1 fL (ref 79.5–101.0)
MONO#: 0.2 10*3/uL (ref 0.1–0.9)
MONO%: 6.4 % (ref 0.0–14.0)
NEUT#: 1.4 10*3/uL — ABNORMAL LOW (ref 1.5–6.5)
NEUT%: 38.4 % (ref 38.4–76.8)
PLATELETS: 166 10*3/uL (ref 145–400)
RBC: 4.28 10*6/uL (ref 3.70–5.45)
RDW: 13 % (ref 11.2–14.5)
WBC: 3.5 10*3/uL — ABNORMAL LOW (ref 3.9–10.3)
lymph#: 1.6 10*3/uL (ref 0.9–3.3)

## 2013-11-30 LAB — COMPREHENSIVE METABOLIC PANEL (CC13)
ALT: 27 U/L (ref 0–55)
ANION GAP: 10 meq/L (ref 3–11)
AST: 21 U/L (ref 5–34)
Albumin: 4.4 g/dL (ref 3.5–5.0)
Alkaline Phosphatase: 21 U/L — ABNORMAL LOW (ref 40–150)
BUN: 15.5 mg/dL (ref 7.0–26.0)
CO2: 29 meq/L (ref 22–29)
CREATININE: 0.8 mg/dL (ref 0.6–1.1)
Calcium: 10.3 mg/dL (ref 8.4–10.4)
Chloride: 106 mEq/L (ref 98–109)
Glucose: 99 mg/dl (ref 70–140)
Potassium: 4.1 mEq/L (ref 3.5–5.1)
Sodium: 144 mEq/L (ref 136–145)
Total Bilirubin: 1.71 mg/dL — ABNORMAL HIGH (ref 0.20–1.20)
Total Protein: 7 g/dL (ref 6.4–8.3)

## 2013-11-30 LAB — SEDIMENTATION RATE: SED RATE: 4 mm/h (ref 0–22)

## 2013-11-30 LAB — LACTATE DEHYDROGENASE (CC13): LDH: 182 U/L (ref 125–245)

## 2013-11-30 MED ORDER — IOHEXOL 300 MG/ML  SOLN
100.0000 mL | Freq: Once | INTRAMUSCULAR | Status: AC | PRN
  Administered 2013-11-30: 100 mL via INTRAVENOUS

## 2013-11-30 MED ORDER — IOHEXOL 300 MG/ML  SOLN
50.0000 mL | Freq: Once | INTRAMUSCULAR | Status: AC | PRN
  Administered 2013-11-30: 50 mL via ORAL

## 2013-11-30 NOTE — Progress Notes (Signed)
Hematology and Oncology Follow Up Visit  Katelyn Irwin [id number] March 28, 1966 48 y.o. 11/30/2013 7:13 PM   Principle Diagnosis: Encounter Diagnoses  Name Primary?  . Low grade B-cell lymphoma Yes  . High grade malignant lymphoma   . Drug-induced leukopenia   . Congenital hyperbilirubinemia      Interim History:   Followup visit for this pleasant 48 year old woman who initially presented with pelvic pain with findings of bulky pelvic lymphadenopathy. 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 She was initially treated with 6 cycles of CHOP Rituxan and achieved a complete response.  On a routine followup CT scan of the neck and chest done in September 2012 she was found to have a cluster of small left supraclavicular lymph nodes enlarged. These had low level activity on a PET scan which was obscured by surrounding brown fat. There was no other uptake to suggest additional areas of disease. A lymph node biopsy was done and in fact showed that the lymph nodes contained low-grade lymphoma and not recurrent high-grade disease.  She was treated with single agent Rituxan plus involved field radiation. She then received maintenance Rituxan one dose every 2 months for 4 treatments. She completed all planned treatments on 04/16/2012. She has developed mild, chronic, leukopenia from the chemotherapy.   She is doing well. She has had no interim medical problems. She has not noted any swollen glands. No infections.   CT scan neck, chest, abdomen and pelvis were done today and I personally reviewed these images with her and her husband . There is no evidence for recurrent disease at any site.   Medications: reviewed  Allergies: No Known Allergies  Review of Systems: Hematology:  No bleeding or bruising ENT ROS: No sore throat Breast ROS:  Respiratory ROS: No cough or dyspnea Cardiovascular ROS:  No chest pain or palpitations Gastrointestinal ROS: No  abdominal pain   Genito-Urinary ROS: Not questioned Musculoskeletal ROS: No bone pain Neurological ROS: No headache Dermatological ROS: No rash Remaining ROS negative:   Physical Exam: Blood pressure 103/65, pulse 67, temperature 98 F (36.7 C), temperature source Oral, resp. rate 18, height 5\' 6"  (1.676 m), weight 154 lb 11.2 oz (70.171 kg), SpO2 99.00%. Wt Readings from Last 3 Encounters:  11/30/13 154 lb 11.2 oz (70.171 kg)  07/04/13 151 lb 11.2 oz (68.811 kg)  01/04/13 151 lb 6.4 oz (68.675 kg)     General appearance: Well-nourished Caucasian woman HENNT: Pharynx no erythema, exudate, mass, or ulcer. No thyromegaly or thyroid nodules Lymph nodes: No cervical, supraclavicular, or axillary lymphadenopathy Breasts:  Lungs: Clear to auscultation, resonant to percussion throughout Heart: Regular rhythm, no murmur, no gallop, no rub, no click, no edema Abdomen: Soft, nontender, normal bowel sounds, no mass, no organomegaly Extremities: No edema, no calf tenderness Musculoskeletal: no joint deformities GU: No inguinal adenopathy Vascular: Carotid pulses 2+, no bruits, Neurologic: Alert, oriented, PERRLA,  cranial nerves grossly normal, motor strength 5 over 5, reflexes 1+ symmetric, upper body coordination normal, gait normal, Skin: No rash or ecchymosis  Lab Results: CBC W/Diff    Component Value Date/Time   WBC 3.5* 11/30/2013 0856   WBC 4.5 06/30/2011 1351   RBC 4.28 11/30/2013 0856   RBC 4.13 06/30/2011 1351   HGB 13.4 11/30/2013 0856   HGB 13.1 06/30/2011 1351   HCT 39.4 11/30/2013 0856   HCT 37.8 06/30/2011 1351   PLT 166 11/30/2013 0856   PLT 164 06/30/2011 1351   MCV 92.1 11/30/2013 0856  MCV 91.5 06/30/2011 1351   MCH 31.3 11/30/2013 0856   MCH 31.7 06/30/2011 1351   MCHC 33.9 11/30/2013 0856   MCHC 34.7 06/30/2011 1351   RDW 13.0 11/30/2013 0856   RDW 12.5 06/30/2011 1351   LYMPHSABS 1.6 11/30/2013 0856   LYMPHSABS 0.9 10/16/2008 0730   MONOABS 0.2 11/30/2013 0856    MONOABS 0.4 10/16/2008 0730   EOSABS 0.3 11/30/2013 0856   EOSABS 0.1 10/16/2008 0730   BASOSABS 0.0 11/30/2013 0856   BASOSABS 0.0 10/16/2008 0730     Chemistry      Component Value Date/Time   NA 144 11/30/2013 0856   NA 139 06/28/2012 1326   NA 143 02/17/2012 0952   K 4.1 11/30/2013 0856   K 4.0 06/28/2012 1326   K 4.6 02/17/2012 0952   CL 104 12/31/2012 1016   CL 101 06/28/2012 1326   CL 101 02/17/2012 0952   CO2 29 11/30/2013 0856   CO2 30 06/28/2012 1326   CO2 27 02/17/2012 0952   BUN 15.5 11/30/2013 0856   BUN 14 06/28/2012 1326   BUN 17 02/17/2012 0952   CREATININE 0.8 11/30/2013 0856   CREATININE 0.75 06/28/2012 1326   CREATININE 1.0 02/17/2012 0952      Component Value Date/Time   CALCIUM 10.3 11/30/2013 0856   CALCIUM 10.3 06/28/2012 1326   CALCIUM 9.0 02/17/2012 0952   ALKPHOS 21* 11/30/2013 0856   ALKPHOS 28* 06/28/2012 1326   ALKPHOS 33 02/17/2012 0952   AST 21 11/30/2013 0856   AST 21 06/28/2012 1326   AST 25 02/17/2012 0952   ALT 27 11/30/2013 0856   ALT 26 06/28/2012 1326   ALT 35 02/17/2012 0952   BILITOT 1.71* 11/30/2013 0856   BILITOT 1.1 06/28/2012 1326   BILITOT 2.50* 02/17/2012 0952       Radiological Studies: Ct Soft Tissue Neck W Contrast  11/30/2013   CLINICAL DATA:  Followup lymphoma.  Drug-induced leukopenia.  EXAM: CT CHEST WITH CONTRAST; CT ABDOMEN AND PELVIS WITH CONTRAST; CT NECK WITH CONTRAST  TECHNIQUE: Multidetector CT imaging of the neck was performed with intravenous contrast.; Multidetector CT imaging of the abdomen and pelvis was performed following the standard protocol during bolus administration of intravenous contrast.; Multidetector CT imaging of the chest was performed following the standard protocol during bolus administration of intravenous contrast.  CONTRAST:  191mL OMNIPAQUE IOHEXOL 300 MG/ML  SOLN  COMPARISON:  06/30/2013  FINDINGS: CT NECK FINDINGS  Small less than 1 cm bilateral jugular chain lymph nodes are stable, none of which are pathologically  enlarged. No pathologically enlarged lymph nodes or other masses are seen within the neck.  The salivary glands and thyroid are normal in appearance. Pharynx and laryngeal structures are also unremarkable in appearance.  CT CHEST FINDINGS  No evidence of mediastinal or hilar masses. No adenopathy seen elsewhere within the thorax. No evidence of pleural or pericardial effusion. No suspicious pulmonary nodules or masses are identified. No evidence of pulmonary infiltrate or central endobronchial lesion. No suspicious bone lesions identified.  CT ABDOMEN AND PELVIS FINDINGS  The liver, gallbladder, spleen, pancreas, adrenal glands, and kidneys are normal in appearance. No evidence of splenomegaly.  No soft tissue masses or lymphadenopathy identified within the abdomen or pelvis.  Uterus and adnexae are unremarkable. No evidence of inflammatory process or abnormal fluid collections. No evidence of bowel wall thickening or dilatation. No suspicious bone lesions identified. Bilateral L5 pars defects incidentally noted.  IMPRESSION: Negative. No evidence of recurrent lymphoma within  the neck, chest, abdomen, or pelvis.   Electronically Signed   By: Earle Gell M.D.   On: 11/30/2013 13:43   Ct Chest W Contrast  11/30/2013   CLINICAL DATA:  Followup lymphoma.  Drug-induced leukopenia.  EXAM: CT CHEST WITH CONTRAST; CT ABDOMEN AND PELVIS WITH CONTRAST; CT NECK WITH CONTRAST  TECHNIQUE: Multidetector CT imaging of the neck was performed with intravenous contrast.; Multidetector CT imaging of the abdomen and pelvis was performed following the standard protocol during bolus administration of intravenous contrast.; Multidetector CT imaging of the chest was performed following the standard protocol during bolus administration of intravenous contrast.  CONTRAST:  184mL OMNIPAQUE IOHEXOL 300 MG/ML  SOLN  COMPARISON:  06/30/2013  FINDINGS: CT NECK FINDINGS  Small less than 1 cm bilateral jugular chain lymph nodes are stable, none  of which are pathologically enlarged. No pathologically enlarged lymph nodes or other masses are seen within the neck.  The salivary glands and thyroid are normal in appearance. Pharynx and laryngeal structures are also unremarkable in appearance.  CT CHEST FINDINGS  No evidence of mediastinal or hilar masses. No adenopathy seen elsewhere within the thorax. No evidence of pleural or pericardial effusion. No suspicious pulmonary nodules or masses are identified. No evidence of pulmonary infiltrate or central endobronchial lesion. No suspicious bone lesions identified.  CT ABDOMEN AND PELVIS FINDINGS  The liver, gallbladder, spleen, pancreas, adrenal glands, and kidneys are normal in appearance. No evidence of splenomegaly.  No soft tissue masses or lymphadenopathy identified within the abdomen or pelvis.  Uterus and adnexae are unremarkable. No evidence of inflammatory process or abnormal fluid collections. No evidence of bowel wall thickening or dilatation. No suspicious bone lesions identified. Bilateral L5 pars defects incidentally noted.  IMPRESSION: Negative. No evidence of recurrent lymphoma within the neck, chest, abdomen, or pelvis.   Electronically Signed   By: Earle Gell M.D.   On: 11/30/2013 13:43   Ct Abdomen Pelvis W Contrast  11/30/2013   CLINICAL DATA:  Followup lymphoma.  Drug-induced leukopenia.  EXAM: CT CHEST WITH CONTRAST; CT ABDOMEN AND PELVIS WITH CONTRAST; CT NECK WITH CONTRAST  TECHNIQUE: Multidetector CT imaging of the neck was performed with intravenous contrast.; Multidetector CT imaging of the abdomen and pelvis was performed following the standard protocol during bolus administration of intravenous contrast.; Multidetector CT imaging of the chest was performed following the standard protocol during bolus administration of intravenous contrast.  CONTRAST:  177mL OMNIPAQUE IOHEXOL 300 MG/ML  SOLN  COMPARISON:  06/30/2013  FINDINGS: CT NECK FINDINGS  Small less than 1 cm bilateral  jugular chain lymph nodes are stable, none of which are pathologically enlarged. No pathologically enlarged lymph nodes or other masses are seen within the neck.  The salivary glands and thyroid are normal in appearance. Pharynx and laryngeal structures are also unremarkable in appearance.  CT CHEST FINDINGS  No evidence of mediastinal or hilar masses. No adenopathy seen elsewhere within the thorax. No evidence of pleural or pericardial effusion. No suspicious pulmonary nodules or masses are identified. No evidence of pulmonary infiltrate or central endobronchial lesion. No suspicious bone lesions identified.  CT ABDOMEN AND PELVIS FINDINGS  The liver, gallbladder, spleen, pancreas, adrenal glands, and kidneys are normal in appearance. No evidence of splenomegaly.  No soft tissue masses or lymphadenopathy identified within the abdomen or pelvis.  Uterus and adnexae are unremarkable. No evidence of inflammatory process or abnormal fluid collections. No evidence of bowel wall thickening or dilatation. No suspicious bone lesions identified. Bilateral L5  pars defects incidentally noted.  IMPRESSION: Negative. No evidence of recurrent lymphoma within the neck, chest, abdomen, or pelvis.   Electronically Signed   By: Earle Gell M.D.   On: 11/30/2013 13:43    Impression:   #1. Initial high grade B-cell non-Hodgkin's lymphoma treated with CHOP-Rituxan  No evidence for new disease now almost 5 years from diagnosis in January 2010.  Hopefully she is cured of the high-grade lymphoma.   #2. 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  Treated with involved field radiation plus Rituxan.  No evidence for new disease now out 21/2  years from diagnosis in September 2012.   #3. Chronic leukopenia  Likely result of previous chemotherapy. Normal white count differential. No recurrent infections.   I will transition her care to Dr. Benay Spice.   CC: Patient Care Team: Mikey Kirschner, MD as PCP - General (Family Medicine) Annia Belt, MD as Consulting Physician (Oncology) Lora Paula, MD as Consulting Physician (Radiation Oncology)   Annia Belt, MD 3/18/20157:13 PM

## 2013-11-30 NOTE — Telephone Encounter (Signed)
gv adn printed aptp sched for Aug and SEpt...pt requested waterbased for ct

## 2013-12-02 ENCOUNTER — Ambulatory Visit: Payer: 59 | Admitting: Oncology

## 2014-02-20 ENCOUNTER — Telehealth: Payer: Self-pay | Admitting: *Deleted

## 2014-02-20 ENCOUNTER — Telehealth: Payer: Self-pay | Admitting: Oncology

## 2014-02-20 NOTE — Telephone Encounter (Signed)
Message from patient reporting a knot on her collarbone. Returned call, pt reports she first noticed nodule about 3 weeks ago. Seems to be getting bigger, would like to be seen in office given her hx of lymphoma. Will review with Dr. Benay Spice. Reviewed with MD: Order received for appt with APP 6/10 @ 1115. Called pt with appt. Order to schedulers.

## 2014-02-20 NOTE — Telephone Encounter (Signed)
Appt made per Tania RN, pt aware

## 2014-02-22 ENCOUNTER — Telehealth: Payer: Self-pay | Admitting: Oncology

## 2014-02-22 ENCOUNTER — Ambulatory Visit (HOSPITAL_BASED_OUTPATIENT_CLINIC_OR_DEPARTMENT_OTHER): Payer: 59 | Admitting: Nurse Practitioner

## 2014-02-22 VITALS — BP 112/71 | HR 61 | Temp 97.1°F | Resp 18 | Ht 66.0 in | Wt 154.5 lb

## 2014-02-22 DIAGNOSIS — C8510 Unspecified B-cell lymphoma, unspecified site: Secondary | ICD-10-CM

## 2014-02-22 DIAGNOSIS — C8589 Other specified types of non-Hodgkin lymphoma, extranodal and solid organ sites: Secondary | ICD-10-CM

## 2014-02-22 DIAGNOSIS — D708 Other neutropenia: Secondary | ICD-10-CM

## 2014-02-22 NOTE — Progress Notes (Addendum)
  [facility] OFFICE PROGRESS NOTE   Diagnosis:  Non-Hodgkin's lymphoma.  INTERVAL HISTORY:   Ms. Carreras returns prior to scheduled followup. She reports a one month history of an enlarging "knot" on the right collarbone. No associated pain. She denies fevers and night sweats. She has periodic hot flashes. Appetite is "okay". No weight loss. She denies shortness of breath. No cough. No bowel or bladder problems.  Objective:  Vital signs in last 24 hours:  Blood pressure 112/71, pulse 61, temperature 97.1 F (36.2 C), temperature source Oral, resp. rate 18, height 5\' 6"  (1.676 m), weight 154 lb 8 oz (70.081 kg).    HEENT: No thrush or ulcerations. Lymphatics: No palpable cervical, supraclavicular, axillary or inguinal lymph nodes. Resp: Lungs clear. Cardio: Regular cardiac rhythm. GI: Abdomen soft and nontender. No organomegaly. No mass. Vascular: No leg edema. MSK: 1 cm rounded, mobile subcutaneous lesion to the right of the sternoclavicular notch.   Lab Results:  Lab Results  Component Value Date   WBC 3.5* 11/30/2013   HGB 13.4 11/30/2013   HCT 39.4 11/30/2013   MCV 92.1 11/30/2013   PLT 166 11/30/2013   NEUTROABS 1.4* 11/30/2013    Imaging:  No results found.  Medications: I have reviewed the patient's current medications.  Assessment/Plan: 1. High-grade B-cell non-Hodgkin's lymphoma presenting with pelvic pain found to have bulky pelvic lymphadenopathy January 2010. Status post 6 cycles of CHOP/Rituxan with a complete response. 2. 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 3. Chronic leukopenia. 4. Subcutaneous lesion right sternoclavicular notch.   Disposition: Ms. Galeno has a new subcutaneous lesion at the right sternoclavicular notch. She is experiencing no other symptoms to  suggest progression of the non-Hodgkin's lymphoma. We discussed a referral to surgery for a biopsy versus returning for a followup visit in 3 weeks to reevaluate. She is comfortable returning in 3 weeks for a followup evaluation. She understands to contact the office prior to that visit with rapid enlargement of the lesion.  Patient seen with Dr. Benay Spice. 25 minutes were spent face-to-face at today's visit with the majority of that time involved in counseling/coordination of care.    Ned Card ANP/GNP-BC   02/22/2014  11:49 AM This was a shared visit with Ned Card. The mass overlying the right clavicle does not have the typical texture of lymphoma. This may be a benign cyst. The differential diagnosis includes progressive non-Hodgkin's lymphoma. We will arrange for an excisional biopsy if the lesion enlarges.  [name], M.D.

## 2014-02-22 NOTE — Telephone Encounter (Signed)
gv and printed appt sched and avs for pt fro July and Aug

## 2014-02-24 ENCOUNTER — Telehealth: Payer: Self-pay | Admitting: Oncology

## 2014-02-24 NOTE — Telephone Encounter (Signed)
Talked to pt and gave her appt for September 10th, MD only, r/s from 05/18/14

## 2014-02-27 ENCOUNTER — Other Ambulatory Visit: Payer: Self-pay | Admitting: Nurse Practitioner

## 2014-02-27 ENCOUNTER — Telehealth: Payer: Self-pay | Admitting: *Deleted

## 2014-02-27 DIAGNOSIS — C8590 Non-Hodgkin lymphoma, unspecified, unspecified site: Secondary | ICD-10-CM

## 2014-02-27 DIAGNOSIS — C8510 Unspecified B-cell lymphoma, unspecified site: Secondary | ICD-10-CM

## 2014-02-27 NOTE — Telephone Encounter (Signed)
Called and left message requesting appointment to have lymph node removed next week. Dr. Benay Spice discussed with Ned Card, NP, who has made surgical referral to Dr. Lucia Gaskins and will contact patient.

## 2014-03-08 ENCOUNTER — Encounter (INDEPENDENT_AMBULATORY_CARE_PROVIDER_SITE_OTHER): Payer: Self-pay | Admitting: Surgery

## 2014-03-08 ENCOUNTER — Ambulatory Visit (INDEPENDENT_AMBULATORY_CARE_PROVIDER_SITE_OTHER): Payer: 59 | Admitting: Surgery

## 2014-03-08 VITALS — BP 122/84 | HR 55 | Temp 97.1°F | Ht 66.0 in | Wt 150.0 lb

## 2014-03-08 DIAGNOSIS — C8510 Unspecified B-cell lymphoma, unspecified site: Secondary | ICD-10-CM

## 2014-03-08 DIAGNOSIS — C8589 Other specified types of non-Hodgkin lymphoma, extranodal and solid organ sites: Secondary | ICD-10-CM

## 2014-03-08 NOTE — Progress Notes (Signed)
Re:   Katelyn Irwin DOB:   Apr 02, 1966 MRN:   [id number]  ASSESSMENT AND PLAN: 1.  Right supraclavicular nodule, possible lymph node.  (1.5 cm nodule over medial aspect of right clavicle)  Will plan to biopsy under local.  Risks include, but are not limited to, bleeding, infection, possible nerve injury, and need for more surgery.   2.  Low grade B cell Lymphoma involving left supraclavicular lymph nodes September 2012 status post single agent Rituxan plus involved field radiation then maintenance Rituxan with all planned treatments completed 04/16/2012.  She was seen by Dr. Beryle Beams, her future care is with Dr. Benay Spice.  3.  High-grade B-cell non-Hodgkin's lymphoma presenting with pelvic pain found to have bulky pelvic lymphadenopathy January 2010. Status post 6 cycles of CHOP/Rituxan with a complete response. 4.  Chronic leukopenia  Chief Complaint  Patient presents with  . eval neck lymphoma   REFERRING PHYSICIAN: Rubbie Battiest, MD  HISTORY OF PRESENT ILLNESS: Katelyn Irwin is a 48 y.o. (DOB: 02/17/1966)  white  female whose primary care physician is Rubbie Battiest, MD and comes to me today for right supraclavicular mass. Her husband is with her. She noticed this mass at her right collar bone for over the last 6 weeks.  She was referred for removal of the mass.  She has had lymphoma twice in the past - in 2010.  Dr. Wilburn Cornelia did a left supraclav biopsy on 07/02/2011. She is having no other symptoms, such as, weight loss, poor appetite, fever, night sweats, or fatigue.  CT scan chest, abdomen, pelvis - 12/01/2103 - Negative. No evidence of recurrent lymphoma within the neck, chest, abdomen, or pelvis.    Past Medical History  Diagnosis Date  . Pyloric stenosis, congenital     surgical repair as infant  . 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 Lymphoma   . Cancer     b-cell lymphoma  . Lymphoma   . Drug-induced leukopenia  02/20/2012    Due to prior chemo for lymphoma  . 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 History of radiation therapy 07/17/11- 08/05/11    left neck30gy/5 fxs      Past Surgical History  Procedure Laterality Date  . Mandible reconstruction      as child, age 12  . Pyloric stenosis surgery      as infant      Current Outpatient Prescriptions  Medication Sig Dispense Refill  . fish oil-omega-3 fatty acids 1000 MG capsule Take 1 g by mouth daily.      . Multiple Vitamin (MULTIVITAMIN) tablet Take 1 tablet by mouth daily.         No current facility-administered medications for this visit.   Facility-Administered Medications Ordered in Other Visits  Medication Dose Route Frequency Provider Last Rate Last Dose  . hyaluronate sodium (RADIAPLEXRX) gel   Topical BID Lora Paula, MD         No Known Allergies  REVIEW OF SYSTEMS: Skin:  No history of rash.  No history of abnormal moles. Infection:  No history of hepatitis or HIV.  No history of MRSA. Neurologic:  No history of stroke.  No history of seizure.  No history of headaches. Cardiac:  No history of hypertension. No history of heart disease.  No history of prior cardiac catheterization.  No history of seeing a cardiologist. Pulmonary:  Does not smoke cigarettes.  No asthma or bronchitis.  No OSA/CPAP.  Endocrine:  No diabetes. No thyroid disease. Gastrointestinal:  No history of stomach disease.  No history of liver disease.  No history of gall bladder disease.  No history of pancreas disease.  No history of colon disease. Urologic:  No history of kidney stones.  No history of bladder infections. Musculoskeletal:  No history of joint or back disease. Hematologic:  History of lymphoma.  See HPI Psycho-social:  The patient is oriented.   The patient has no obvious psychologic or social impairment to understanding our conversation and plan.  SOCIAL and FAMILY HISTORY: Married. Husband with her. She works as Pension scheme manager. She has a 28, 55 and 46 yo.  PHYSICAL EXAM: BP 122/84  Pulse 55  Temp(Src) 97.1 F (36.2 C)  Ht 5\' 6"  (1.676 m)  Wt 150 lb (68.04 kg)  BMI 24.22 kg/m2  [name]  HEENT: Normal. Pupils equal. Neck: Supple. No mass.  No thyroid mass. Lymph Nodes:  No supraclavicular, cervical, or axillary nodes.  She has a 1.5 nodule over the medial aspect of the right clavicle - lymph node or subcut mass?  Scar left supraclavicular area. Lungs: Clear to auscultation and symmetric breath sounds. Heart:  RRR. No murmur or rub. Abdomen: Soft. No mass. No tenderness. No hernia. Normal bowel sounds.  Right paramedian scar.  Extremities:  Good strength and ROM  in upper and lower extremities. Neurologic:  Grossly intact to motor and sensory function. Psychiatric: Has normal mood and affect. Behavior is normal.   DATA REVIEWED: Epic notes  Alphonsa Overall, MD,  [facility] [address] [place]  [place], [place]    [place] Phone:  [place]:  [phone]

## 2014-03-10 ENCOUNTER — Other Ambulatory Visit (INDEPENDENT_AMBULATORY_CARE_PROVIDER_SITE_OTHER): Payer: Self-pay | Admitting: Surgery

## 2014-03-10 ENCOUNTER — Other Ambulatory Visit (INDEPENDENT_AMBULATORY_CARE_PROVIDER_SITE_OTHER): Payer: Self-pay

## 2014-03-10 DIAGNOSIS — C8581 Other specified types of non-Hodgkin lymphoma, lymph nodes of head, face, and neck: Secondary | ICD-10-CM

## 2014-03-15 ENCOUNTER — Telehealth: Payer: Self-pay | Admitting: *Deleted

## 2014-03-15 NOTE — Telephone Encounter (Signed)
Call from pt asking if she should keep 7/2 appt or reschedule for later date. Informed her Dr. Benay Spice is able to review path report, keep appt as scheduled. She voiced understanding.

## 2014-03-16 ENCOUNTER — Ambulatory Visit (HOSPITAL_BASED_OUTPATIENT_CLINIC_OR_DEPARTMENT_OTHER): Payer: 59 | Admitting: Oncology

## 2014-03-16 ENCOUNTER — Telehealth: Payer: Self-pay | Admitting: Oncology

## 2014-03-16 ENCOUNTER — Ambulatory Visit (HOSPITAL_BASED_OUTPATIENT_CLINIC_OR_DEPARTMENT_OTHER): Payer: 59

## 2014-03-16 VITALS — BP 111/70 | HR 98 | Ht 66.0 in | Wt 154.4 lb

## 2014-03-16 DIAGNOSIS — C8590 Non-Hodgkin lymphoma, unspecified, unspecified site: Secondary | ICD-10-CM

## 2014-03-16 DIAGNOSIS — C8581 Other specified types of non-Hodgkin lymphoma, lymph nodes of head, face, and neck: Secondary | ICD-10-CM

## 2014-03-16 DIAGNOSIS — D72819 Decreased white blood cell count, unspecified: Secondary | ICD-10-CM

## 2014-03-16 LAB — COMPREHENSIVE METABOLIC PANEL (CC13)
ALBUMIN: 4.5 g/dL (ref 3.5–5.0)
ALK PHOS: 28 U/L — AB (ref 40–150)
ALT: 33 U/L (ref 0–55)
AST: 23 U/L (ref 5–34)
Anion Gap: 10 mEq/L (ref 3–11)
BUN: 15.2 mg/dL (ref 7.0–26.0)
CO2: 31 mEq/L — ABNORMAL HIGH (ref 22–29)
Calcium: 10.5 mg/dL — ABNORMAL HIGH (ref 8.4–10.4)
Chloride: 103 mEq/L (ref 98–109)
Creatinine: 0.9 mg/dL (ref 0.6–1.1)
GLUCOSE: 102 mg/dL (ref 70–140)
POTASSIUM: 4.3 meq/L (ref 3.5–5.1)
Sodium: 143 mEq/L (ref 136–145)
TOTAL PROTEIN: 7.4 g/dL (ref 6.4–8.3)
Total Bilirubin: 1.86 mg/dL — ABNORMAL HIGH (ref 0.20–1.20)

## 2014-03-16 LAB — CBC WITH DIFFERENTIAL/PLATELET
BASO%: 0.7 % (ref 0.0–2.0)
Basophils Absolute: 0 10*3/uL (ref 0.0–0.1)
EOS ABS: 0.3 10*3/uL (ref 0.0–0.5)
EOS%: 7.1 % — ABNORMAL HIGH (ref 0.0–7.0)
HCT: 40.8 % (ref 34.8–46.6)
HGB: 13.9 g/dL (ref 11.6–15.9)
LYMPH#: 1.5 10*3/uL (ref 0.9–3.3)
LYMPH%: 35.2 % (ref 14.0–49.7)
MCH: 31.6 pg (ref 25.1–34.0)
MCHC: 34.1 g/dL (ref 31.5–36.0)
MCV: 92.7 fL (ref 79.5–101.0)
MONO#: 0.3 10*3/uL (ref 0.1–0.9)
MONO%: 7.6 % (ref 0.0–14.0)
NEUT%: 49.4 % (ref 38.4–76.8)
NEUTROS ABS: 2.2 10*3/uL (ref 1.5–6.5)
Platelets: 175 10*3/uL (ref 145–400)
RBC: 4.4 10*6/uL (ref 3.70–5.45)
RDW: 12.7 % (ref 11.2–14.5)
WBC: 4.4 10*3/uL (ref 3.9–10.3)

## 2014-03-16 LAB — LACTATE DEHYDROGENASE (CC13): LDH: 185 U/L (ref 125–245)

## 2014-03-16 NOTE — Telephone Encounter (Signed)
, °

## 2014-03-16 NOTE — Progress Notes (Signed)
  [facility] OFFICE PROGRESS NOTE   Diagnosis: Non-Hodgkin's lymphoma  INTERVAL HISTORY:   Ms. Toppin was referred to Dr. Lucia Gaskins to evaluate the right infraclavicular mass. He performed an excisional biopsy of the mass on 03/10/2014. The pathology [id number]) confirmed a follicular high-grade lymphoma. There is a follicular pattern with an abundance of large noncleaved and cleaved cells. There is a lack of a diffuse component. Flow cytometry did not reveal a monoclonal B-cell or abnormal T cell phenotype. Immunohistochemical stains found the lymphoid follicles composed of the cells with CD20, BC 06, and BCL-2 expression. Patchy weak positivity for CD10. The lymphoid follicles were surrounded by a minor component of T cells. Ki-67 was increased up to 71% and some follicles.  She denies fever, night sweats, and anorexia. No palpable lymph nodes.  Objective:  Vital signs in last 24 hours:  Blood pressure 111/70, pulse 98, height $RemoveBe'5\' 6"'swrUTuwyb$  (1.676 m), weight 154 lb 6.4 oz (70.035 kg).    HEENT: Neck without mass, oropharynx without visible mass Lymphatics: No cervical, supraclavicular, axillary, or inguinal nodes Resp: Lungs clear bilaterally Cardio: Regular rate and rhythm GI: No hepatosplenomegaly, nontender, no mass Vascular: No leg edema Skin: Healing incision at the right upper anterior chest     Lab Results:  Lab Results  Component Value Date   WBC 4.4 03/16/2014   HGB 13.9 03/16/2014   HCT 40.8 03/16/2014   MCV 92.7 03/16/2014   PLT 175 03/16/2014   NEUTROABS 2.2 03/16/2014    Medications: I have reviewed the patient's current medications.  Assessment/Plan: 1. High-grade B-cell non-Hodgkin's lymphoma presenting with pelvic pain found to have bulky pelvic lymphadenopathy January 2010. Status post 6 cycles of CHOP/Rituxan with a complete response. 2. 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 3. Chronic leukopenia. 4. Subcutaneous lesion right sternoclavicular notch-excisional biopsy 03/10/2012 confirmed a high-grade follicular lymphoma (grade 3A)  Disposition:  Ms. Wideman has been diagnosed with recurrent lymphoma. I discussed the pathology report with Ms. Huebsch and her husband. She appears asymptomatic from lymphoma and there is no clinical evidence of additional disease. She will be referred for a staging PET scan.  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 Treatment options will include observation if she has no additional evidence of measurable disease, a standard salvage systemic regimen, and referral for stem cell therapy.  I will ask Dr. Gari Crown to compare the current biopsy to the 2010 and 2012 material.  Ms. Hauter will return for office visit and further discussion after the staging PET scan.  [name], MD  03/16/2014  2:53 PM

## 2014-03-22 ENCOUNTER — Ambulatory Visit (HOSPITAL_COMMUNITY)
Admission: RE | Admit: 2014-03-22 | Discharge: 2014-03-22 | Disposition: A | Discharge status: Home / Self Care | Payer: 59 | Source: Ambulatory Visit | Attending: Oncology | Admitting: Oncology

## 2014-03-22 DIAGNOSIS — C8589 Other specified types of non-Hodgkin lymphoma, extranodal and solid organ sites: Secondary | ICD-10-CM | POA: Insufficient documentation

## 2014-03-22 DIAGNOSIS — C8590 Non-Hodgkin lymphoma, unspecified, unspecified site: Secondary | ICD-10-CM

## 2014-03-22 LAB — GLUCOSE, CAPILLARY: Glucose-Capillary: 98 mg/dL (ref 70–99)

## 2014-03-22 MED ORDER — FLUDEOXYGLUCOSE F - 18 (FDG) INJECTION
8.8000 | Freq: Once | INTRAVENOUS | Status: AC | PRN
  Administered 2014-03-22: 7.8 via INTRAVENOUS

## 2014-03-23 ENCOUNTER — Telehealth: Payer: Self-pay | Admitting: Oncology

## 2014-03-23 ENCOUNTER — Ambulatory Visit (INDEPENDENT_AMBULATORY_CARE_PROVIDER_SITE_OTHER): Payer: 59 | Admitting: Surgery

## 2014-03-23 ENCOUNTER — Ambulatory Visit (HOSPITAL_BASED_OUTPATIENT_CLINIC_OR_DEPARTMENT_OTHER): Payer: 59 | Admitting: Nurse Practitioner

## 2014-03-23 ENCOUNTER — Encounter (INDEPENDENT_AMBULATORY_CARE_PROVIDER_SITE_OTHER): Payer: Self-pay | Admitting: Surgery

## 2014-03-23 VITALS — BP 105/70 | HR 56 | Temp 97.8°F | Resp 18 | Ht 66.0 in | Wt 154.7 lb

## 2014-03-23 VITALS — BP 118/62 | HR 65 | Temp 98.0°F | Resp 18 | Ht 65.0 in | Wt 155.0 lb

## 2014-03-23 DIAGNOSIS — C8589 Other specified types of non-Hodgkin lymphoma, extranodal and solid organ sites: Secondary | ICD-10-CM

## 2014-03-23 DIAGNOSIS — D72819 Decreased white blood cell count, unspecified: Secondary | ICD-10-CM

## 2014-03-23 DIAGNOSIS — C8590 Non-Hodgkin lymphoma, unspecified, unspecified site: Secondary | ICD-10-CM

## 2014-03-23 DIAGNOSIS — C8510 Unspecified B-cell lymphoma, unspecified site: Secondary | ICD-10-CM

## 2014-03-23 DIAGNOSIS — C8291 Follicular lymphoma, unspecified, lymph nodes of head, face, and neck: Secondary | ICD-10-CM

## 2014-03-23 NOTE — Progress Notes (Addendum)
[facility] OFFICE PROGRESS NOTE   Diagnosis:  Non-Hodgkin's lymphoma.  INTERVAL HISTORY:   Katelyn Irwin returns as scheduled. She feels well. No fevers or sweats. She has a good appetite. No pain. The biopsy site at the right upper anterior chest has healed.  Objective:  Vital signs in last 24 hours:  Blood pressure 105/70, pulse 56, temperature 97.8 F (36.6 C), temperature source Oral, resp. rate 18, height 5\' 6"  (1.676 m), weight 154 lb 11.2 oz (70.171 kg).    HEENT: No thrush or ulcerations. Lymphatics: No palpable cervical, supraclavicular, axillary or inguinal lymph nodes. Resp: Lungs clear. Cardio: Regular cardiac rhythm. GI: Abdomen soft and nontender. No organomegaly. No mass. Vascular: No leg edema.  Skin: Incision at the right upper anterior chest has healed.    Lab Results:  Lab Results  Component Value Date   WBC 4.4 03/16/2014   HGB 13.9 03/16/2014   HCT 40.8 03/16/2014   MCV 92.7 03/16/2014   PLT 175 03/16/2014   NEUTROABS 2.2 03/16/2014    Imaging:  Nm Pet Image Restag (ps) Skull Base To Thigh  03/22/2014   CLINICAL DATA:  Subsequent treatment strategy for lymphoma.  EXAM: NUCLEAR MEDICINE PET SKULL BASE TO THIGH  TECHNIQUE: 7.8 mCi F-18 FDG was injected intravenously. Full-ring PET imaging was performed from the skull base to thigh after the radiotracer. CT data was obtained and used for attenuation correction and anatomic localization.  FASTING BLOOD GLUCOSE:  Value: 98 mg/dl  COMPARISON:  CT neck, chest abdomen pelvis 11/30/2013, PET-CT 05/30/2011  FINDINGS: NECK  As with the previous PET-CT scan from 2012, there is extensive hypermetabolic brown fat within the lower neck and posterior triangles which makes interpretation challenging. There is a small level 2 lymph node on the left measuring 5 mm short axis with mild metabolic activity (SUV max 2.5, image 19). Mild activity in the salivary glands andWaldeyer's ring is felt be within physiologic limits.   There are no clear hypermetabolic supraclavicular or infraclavicular lymph nodes. Again metabolic activity is noted within the brown fat and vascular structures.  CHEST  No hypermetabolic mediastinal or hilar nodes. No suspicious pulmonary nodules on the CT scan. There is mild diffuse metabolic activity associated with the esophagus which likely represent esophagitis. Metabolic brown fat along the paraspinal fat.  ABDOMEN/PELVIS  No abnormal metabolic activity within the spleen which is normal volume. No hypermetabolic abdominal pelvic lymph nodes.  SKELETON  No focal hypermetabolic activity to suggest skeletal metastasis.  IMPRESSION: 1. No clear evidence of active lymphoma by FDG PET-CT imaging. Extensive brown fat within the lower neck does make interpretation challenging. There is no specific hypermetabolic lymph nodes in the left or right supraclavicular / infraclavicular nodal location to correspond to the biopsy-proven lymphoma. 2. Normal spleen. No hypermetabolic adenopathy in the chest, abdomen, or pelvis. 3. No skeletal abnormality. 4. Diffuse activity within the esophagus suggests esophagitis.   Electronically Signed   By: Suzy Bouchard M.D.   On: 03/22/2014 10:31    Medications: I have reviewed the patient's current medications.  Assessment/Plan: 1. High-grade B-cell non-Hodgkin's lymphoma presenting with pelvic pain found to have bulky pelvic lymphadenopathy January 2010. Status post 6 cycles of CHOP/Rituxan with a complete response. 2. 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 3. Chronic leukopenia. 4. Subcutaneous lesion right sternoclavicular notch-excisional biopsy 03/10/2012 confirmed a high-grade follicular lymphoma (grade 3A)  5. PET scan 03/22/2014. No clear evidence of active lymphoma. Extensive brown fat within  the lower neck.    Disposition: Katelyn Irwin has been diagnosed with recurrent lymphoma. She appears asymptomatic. The recent PET scan showed no definite evidence of active lymphoma. Dr. Benay Spice reviewed the result with Katelyn Irwin and her husband and recommends observation for now as well as a second opinion with Dr. Reita Chard at [facility]. She is comfortable with this plan.  She will return for a followup visit in 6 weeks. She understands to contact the office prior to that visit with onset of any concerning symptoms such as fever, sweats, anorexia, weight loss, palpable adenopathy.   Patient seen with Dr. Benay Spice.    Ned Card ANP/GNP-BC   03/23/2014  11:40 AM This was a shared visit with Ned Card. The PET scan reveals no evidence of lymphoma. I will request review of the 3 lymphoma pathology specimens by Dr. Gari Crown. We will observe her for now. I will refer her to Dr. Reita Chard for a second opinion regarding treatment options.  [name], M.D.

## 2014-03-23 NOTE — Telephone Encounter (Signed)
gave pt appt for Md , gave referral to HIM for [facility]

## 2014-03-23 NOTE — Progress Notes (Signed)
Re:   Katelyn Irwin DOB:   1966-07-03 MRN:   [id number]  ASSESSMENT AND PLAN: 1.  Follicular lymphoma, high grade  Right supraclavicular nodule biopsy at [facility] ([id number]) - 0/63/0160 - Follicular lymphoma, high grade  She is being followed by Dr. Benay Irwin.  No further treatment planned for now.  She is going to [facility] for a second opinion.  Her return appointment with me is PRN.    2.  Low grade B cell Lymphoma involving left supraclavicular lymph nodes September 2012 status post single agent Rituxan plus involved field radiation then maintenance Rituxan with all planned treatments completed 04/16/2012.  Now followed by Dr. Benay Irwin (she was with Dr. Beryle Irwin) 3.  High-grade B-cell non-Hodgkin's lymphoma presenting with pelvic pain found to have bulky pelvic lymphadenopathy January 2010. Status post 6 cycles of CHOP/Rituxan with a complete response. 4.  Chronic leukopenia  Chief Complaint  Patient presents with  . Routine Post Op    subclavicial    REFERRING PHYSICIAN: Rubbie Battiest, MD  HISTORY OF PRESENT ILLNESS: Katelyn Irwin is a 48 y.o. (DOB: 1966-03-20)  white  female whose primary care physician is Katelyn Battiest, MD and comes to me today for right supraclavicular mass. Her husband is with her. She has already seen Dr. Benay Irwin today.  Her PET scan was negative.  I gave her a copy of the scan. Dr. Benay Irwin is going to send her to [facility] for a second opinion.  History of lymphoma (02/2014): Katelyn Irwin did a left supraclav biopsy on 07/02/2011. She is having no other symptoms, such as, weight loss, poor appetite, fever, night sweats, or fatigue.  CT scan chest, abdomen, pelvis - 12/01/2103 - Negative. No evidence of recurrent lymphoma within the neck, chest, abdomen, or pelvis.    Past Medical History  Diagnosis Date  . Pyloric stenosis, congenital     surgical repair as infant  . 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 Lymphoma    . Cancer     b-cell lymphoma  . Lymphoma   . Drug-induced leukopenia 02/20/2012    Due to prior chemo for lymphoma  . 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 History of radiation therapy 07/17/11- 08/05/11    left neck30gy/5 fxs      Past Surgical History  Procedure Laterality Date  . Mandible reconstruction      as child, age 29  . Pyloric stenosis surgery      as infant      Current Outpatient Prescriptions  Medication Sig Dispense Refill  . fish oil-omega-3 fatty acids 1000 MG capsule Take 1 g by mouth daily.      . Multiple Vitamin (MULTIVITAMIN) tablet Take 1 tablet by mouth daily.         No current facility-administered medications for this visit.   Facility-Administered Medications Ordered in Other Visits  Medication Dose Route Frequency Provider Last Rate Last Dose  . hyaluronate sodium (RADIAPLEXRX) gel   Topical BID Katelyn Paula, MD         No Known Allergies  REVIEW OF SYSTEMS: Hematologic:  History of lymphoma.  See HPI  SOCIAL and FAMILY HISTORY: Married. Husband with her. She works as Chief Executive Officer. She has a 75, 40 and 46 yo.  PHYSICAL EXAM: BP 118/62  Pulse 65  Temp(Src) 98 F (36.7 C)  Resp 18  Ht 5\' 5"  (1.651 m)  Wt 155 lb (70.308 kg)  BMI 25.79 kg/m2  [name]  HEENT: Normal. Pupils equal. Neck: Supple. No mass.  No thyroid mass. Lymph Nodes:  Right supraclavicular scar looks okay.  Scar left supraclavicular area.  DATA REVIEWED: Copies of path and PET scan to patient.  Katelyn Overall, MD,  [facility], [address]  [place], [place]    [place] Phone:  [phone] FAX:  [phone]

## 2014-03-28 ENCOUNTER — Telehealth: Payer: Self-pay | Admitting: Oncology

## 2014-03-28 NOTE — Telephone Encounter (Signed)
Pt appt. To see Dr. Reita Chard @ [facility] is 04/06/14@7 :00am. Medical records faxed. Pt is aware

## 2014-04-03 ENCOUNTER — Other Ambulatory Visit: Payer: Self-pay | Admitting: Obstetrics and Gynecology

## 2014-04-04 LAB — CYTOLOGY - PAP

## 2014-04-05 DIAGNOSIS — Z923 Personal history of irradiation: Secondary | ICD-10-CM | POA: Insufficient documentation

## 2014-04-05 DIAGNOSIS — C8330 Diffuse large B-cell lymphoma, unspecified site: Secondary | ICD-10-CM | POA: Insufficient documentation

## 2014-04-05 DIAGNOSIS — Z9221 Personal history of antineoplastic chemotherapy: Secondary | ICD-10-CM | POA: Insufficient documentation

## 2014-05-04 ENCOUNTER — Ambulatory Visit (HOSPITAL_BASED_OUTPATIENT_CLINIC_OR_DEPARTMENT_OTHER): Payer: 59 | Admitting: Oncology

## 2014-05-04 ENCOUNTER — Telehealth: Payer: Self-pay | Admitting: *Deleted

## 2014-05-04 ENCOUNTER — Telehealth: Payer: Self-pay | Admitting: Oncology

## 2014-05-04 VITALS — BP 126/75 | HR 54 | Temp 98.0°F | Resp 19 | Ht 65.0 in | Wt 158.2 lb

## 2014-05-04 DIAGNOSIS — C8589 Other specified types of non-Hodgkin lymphoma, extranodal and solid organ sites: Secondary | ICD-10-CM

## 2014-05-04 DIAGNOSIS — D72819 Decreased white blood cell count, unspecified: Secondary | ICD-10-CM

## 2014-05-04 DIAGNOSIS — C8510 Unspecified B-cell lymphoma, unspecified site: Secondary | ICD-10-CM

## 2014-05-04 NOTE — Telephone Encounter (Signed)
Per Dr. Benay Spice; notified pt that MD reviewed chart and it has been 2 years from last Rituxan treatment to relapse.  Pt verbalized understanding and expressed appreciation for call.

## 2014-05-04 NOTE — Telephone Encounter (Signed)
gv and printed appt sched and avs for pt for OCT. °

## 2014-05-04 NOTE — Telephone Encounter (Signed)
Pt left message asking "does Dr. Benay Spice recommend going back on Rituxan at this time?  Per Dr. Benay Spice; notified pt that MD still recommends observation approach at this time.  Pt verbalized understanding of information.

## 2014-05-04 NOTE — Progress Notes (Signed)
  [facility] OFFICE PROGRESS NOTE   Diagnosis: Non-Hodgkin's lymphoma  INTERVAL HISTORY:   Katelyn Irwin returns as scheduled. She feels well. No fever, night sweats, or anorexia. No palpable lymph nodes. She saw Dr. Josie Saunders on 04/06/2014. She recommends observation versus maintenance rituximab.  Objective:  Vital signs in last 24 hours:  Blood pressure 126/75, pulse 54, temperature 98 F (36.7 C), temperature source Oral, resp. rate 19, height 5\' 5"  (1.651 m), weight 158 lb 3.2 oz (71.759 kg).    HEENT: Neck without mass Lymphatics: No cervical, supraclavicular, axillary, or inguinal nodes Resp: Lungs clear bilaterally Cardio: Regular rate and rhythm GI: No hepatosplenomegaly Vascular: No leg edema  Skin: Right upper chest scar without evidence of recurrent tumor     Lab Results:  Lab Results  Component Value Date   WBC 4.4 03/16/2014   HGB 13.9 03/16/2014   HCT 40.8 03/16/2014   MCV 92.7 03/16/2014   PLT 175 03/16/2014   NEUTROABS 2.2 03/16/2014    Medications: I have reviewed the patient's current medications.  Assessment/Plan: 1. High-grade B-cell non-Hodgkin's lymphoma presenting with pelvic pain found to have bulky pelvic lymphadenopathy January 2010. Status post 6 cycles of CHOP/Rituxan with a complete response. 2. 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 3. Chronic leukopenia. 4. Subcutaneous lesion right sternoclavicular notch-excisional biopsy 03/10/2014 confirmed a high-grade follicular lymphoma (grade 3A) 5. PET scan 03/22/2014. No clear evidence of active lymphoma. Extensive brown fat within the lower neck.    Disposition:  There is no clinical evidence for progression of the non-Hodgkin's lymphoma. She does not wish to consider maintenance rituximab since she was treated with  maintenance rituximab in the past. We decided against a salvage chemotherapy regimen at present since she is asymptomatic and there is no measurable disease. The plan is to continue close observation. She will be scheduled for restaging CTs and an office visit in October. Katelyn Irwin will contact us in the interim for new symptoms.  [name], MD  05/04/2014  1:17 PM

## 2014-05-12 ENCOUNTER — Ambulatory Visit (HOSPITAL_COMMUNITY): Payer: 59

## 2014-05-12 ENCOUNTER — Other Ambulatory Visit: Payer: 59

## 2014-05-18 ENCOUNTER — Ambulatory Visit: Payer: 59 | Admitting: Oncology

## 2014-05-25 ENCOUNTER — Ambulatory Visit: Payer: 59 | Admitting: Oncology

## 2014-06-23 ENCOUNTER — Other Ambulatory Visit: Payer: Self-pay | Admitting: Oncology

## 2014-06-23 ENCOUNTER — Ambulatory Visit (HOSPITAL_COMMUNITY)
Admission: RE | Admit: 2014-06-23 | Discharge: 2014-06-23 | Disposition: A | Discharge status: Home / Self Care | Payer: 59 | Source: Ambulatory Visit | Attending: Oncology | Admitting: Oncology

## 2014-06-23 ENCOUNTER — Other Ambulatory Visit (HOSPITAL_BASED_OUTPATIENT_CLINIC_OR_DEPARTMENT_OTHER): Payer: 59

## 2014-06-23 DIAGNOSIS — C8590 Non-Hodgkin lymphoma, unspecified, unspecified site: Secondary | ICD-10-CM

## 2014-06-23 DIAGNOSIS — D702 Other drug-induced agranulocytosis: Secondary | ICD-10-CM

## 2014-06-23 DIAGNOSIS — C8510 Unspecified B-cell lymphoma, unspecified site: Secondary | ICD-10-CM | POA: Insufficient documentation

## 2014-06-23 DIAGNOSIS — Z09 Encounter for follow-up examination after completed treatment for conditions other than malignant neoplasm: Secondary | ICD-10-CM

## 2014-06-23 DIAGNOSIS — C8580 Other specified types of non-Hodgkin lymphoma, unspecified site: Secondary | ICD-10-CM | POA: Insufficient documentation

## 2014-06-23 LAB — COMPREHENSIVE METABOLIC PANEL (CC13)
ALT: 27 U/L (ref 0–55)
ANION GAP: 7 meq/L (ref 3–11)
AST: 18 U/L (ref 5–34)
Albumin: 4.1 g/dL (ref 3.5–5.0)
Alkaline Phosphatase: 24 U/L — ABNORMAL LOW (ref 40–150)
BILIRUBIN TOTAL: 1.99 mg/dL — AB (ref 0.20–1.20)
BUN: 15.9 mg/dL (ref 7.0–26.0)
CO2: 29 mEq/L (ref 22–29)
CREATININE: 0.8 mg/dL (ref 0.6–1.1)
Calcium: 10.4 mg/dL (ref 8.4–10.4)
Chloride: 106 mEq/L (ref 98–109)
Glucose: 99 mg/dl (ref 70–140)
Potassium: 4.4 mEq/L (ref 3.5–5.1)
Sodium: 143 mEq/L (ref 136–145)
Total Protein: 6.7 g/dL (ref 6.4–8.3)

## 2014-06-23 LAB — CBC WITH DIFFERENTIAL/PLATELET
BASO%: 1 % (ref 0.0–2.0)
Basophils Absolute: 0 10*3/uL (ref 0.0–0.1)
EOS%: 9.2 % — ABNORMAL HIGH (ref 0.0–7.0)
Eosinophils Absolute: 0.4 10*3/uL (ref 0.0–0.5)
HCT: 37.4 % (ref 34.8–46.6)
HGB: 12.5 g/dL (ref 11.6–15.9)
LYMPH%: 41.1 % (ref 14.0–49.7)
MCH: 31.1 pg (ref 25.1–34.0)
MCHC: 33.6 g/dL (ref 31.5–36.0)
MCV: 92.5 fL (ref 79.5–101.0)
MONO#: 0.3 10*3/uL (ref 0.1–0.9)
MONO%: 6.2 % (ref 0.0–14.0)
NEUT#: 1.7 10*3/uL (ref 1.5–6.5)
NEUT%: 42.5 % (ref 38.4–76.8)
PLATELETS: 176 10*3/uL (ref 145–400)
RBC: 4.04 10*6/uL (ref 3.70–5.45)
RDW: 12.7 % (ref 11.2–14.5)
WBC: 4.1 10*3/uL (ref 3.9–10.3)
lymph#: 1.7 10*3/uL (ref 0.9–3.3)

## 2014-06-23 LAB — TSH CHCC: TSH: 1.57 m(IU)/L (ref 0.308–3.960)

## 2014-06-23 LAB — LACTATE DEHYDROGENASE (CC13): LDH: 183 U/L (ref 125–245)

## 2014-06-23 LAB — SEDIMENTATION RATE: SED RATE: 8 mm/h (ref 0–22)

## 2014-06-23 MED ORDER — IOHEXOL 300 MG/ML  SOLN
50.0000 mL | Freq: Once | INTRAMUSCULAR | Status: AC | PRN
  Administered 2014-06-23: 50 mL via ORAL

## 2014-06-23 MED ORDER — IOHEXOL 300 MG/ML  SOLN
100.0000 mL | Freq: Once | INTRAMUSCULAR | Status: AC | PRN
  Administered 2014-06-23: 100 mL via INTRAVENOUS

## 2014-06-26 ENCOUNTER — Ambulatory Visit (HOSPITAL_BASED_OUTPATIENT_CLINIC_OR_DEPARTMENT_OTHER): Payer: 59 | Admitting: Oncology

## 2014-06-26 ENCOUNTER — Telehealth: Payer: Self-pay | Admitting: Oncology

## 2014-06-26 VITALS — BP 129/71 | HR 54 | Temp 98.4°F | Resp 18 | Ht 65.0 in | Wt 158.0 lb

## 2014-06-26 DIAGNOSIS — C8510 Unspecified B-cell lymphoma, unspecified site: Secondary | ICD-10-CM

## 2014-06-26 DIAGNOSIS — Z23 Encounter for immunization: Secondary | ICD-10-CM

## 2014-06-26 DIAGNOSIS — C8598 Non-Hodgkin lymphoma, unspecified, lymph nodes of multiple sites: Secondary | ICD-10-CM

## 2014-06-26 DIAGNOSIS — D72819 Decreased white blood cell count, unspecified: Secondary | ICD-10-CM

## 2014-06-26 MED ORDER — INFLUENZA VAC SPLIT QUAD 0.5 ML IM SUSY
0.5000 mL | PREFILLED_SYRINGE | Freq: Once | INTRAMUSCULAR | Status: AC
  Administered 2014-06-26: 0.5 mL via INTRAMUSCULAR
  Filled 2014-06-26: qty 0.5

## 2014-06-26 NOTE — Telephone Encounter (Signed)
Pt confirmed labs/ov per 10/12 POF, gave pt AVS.... KJ °

## 2014-06-26 NOTE — Progress Notes (Signed)
[place] OFFICE PROGRESS NOTE   Diagnosis: Non-Hodgkin's lymphoma  INTERVAL HISTORY:   She returns as scheduled. She feels well. No fever, night sweats, anorexia, or palpable lymph nodes. She continues to have soreness at the right upper chest surgical site.  Objective:  Vital signs in last 24 hours:  Blood pressure 129/71, pulse 54, temperature 98.4 F (36.9 C), temperature source Oral, resp. rate 18, height 5\' 5"  (1.651 m), weight 158 lb (71.668 kg), SpO2 100.00%.    HEENT: Oropharynx without visible mass, neck without mass Lymphatics: No cervical, supra-clavicular, axillary, or inguinal nodes Resp: Lungs clear bilaterally Cardio: Regular rate and rhythm GI: No hepatomegaly, nontender, no mass Vascular: No leg edema   Lab Results:  Lab Results  Component Value Date   WBC 4.1 06/23/2014   HGB 12.5 06/23/2014   HCT 37.4 06/23/2014   MCV 92.5 06/23/2014   PLT 176 06/23/2014   NEUTROABS 1.7 06/23/2014   LDH 183 Imaging:  Ct Soft Tissue Neck W Contrast  06/23/2014   CLINICAL DATA:  Followup treated high-grade and low grade lymphoma.  EXAM: CT NECK, CHEST, ABDOMEN AND PELVIS WITH CONTRAST  TECHNIQUE: Multidetector CT imaging of the neck, chest, abdomen and pelvis was performed using the standard protocol following the bolus administration of intravenous contrast.  CONTRAST:  134mL OMNIPAQUE IOHEXOL 300 MG/ML  SOLN  COMPARISON:  PET CT 03/22/2014.  CT 11/30/2013.  FINDINGS: CT NECK FINDINGS  No evidence of cervical adenopathy. Small scattered non pathologically enlarged bilateral level II lymph nodes are stable. Airways patent. Epiglottis and aryepiglottic folds are normal. Thyroid and salivary glands are unremarkable. Visualized orbital soft tissues unremarkable. Visualized paranasal sinuses and mastoids are clear.  CT CHEST FINDINGS  Heart is normal size. Aorta is normal caliber. No mediastinal, hilar, or axillary adenopathy. Chest wall soft tissues are  unremarkable. Lungs are clear. No focal airspace opacities or suspicious nodules. No effusions.  CT ABDOMEN AND PELVIS FINDINGS  Liver, gallbladder, spleen, pancreas, adrenals and kidneys are normal. Uterus, adnexae and urinary bladder unremarkable. Appendix is visualized and is normal. No free fluid, free air or adenopathy. Aorta is normal caliber.  No acute bony abnormality or focal bone lesion throughout the neck, chest, abdomen or pelvis. Bilateral L5 pars defects noted.  IMPRESSION: No evidence of adenopathy in the neck, chest, abdomen or pelvis. No acute findings.   Electronically Signed   By: Rolm Baptise M.D.   On: 06/23/2014 11:46   Ct Chest W Contrast  06/23/2014   CLINICAL DATA:  Followup treated high-grade and low grade lymphoma.  EXAM: CT NECK, CHEST, ABDOMEN AND PELVIS WITH CONTRAST  TECHNIQUE: Multidetector CT imaging of the neck, chest, abdomen and pelvis was performed using the standard protocol following the bolus administration of intravenous contrast.  CONTRAST:  134mL OMNIPAQUE IOHEXOL 300 MG/ML  SOLN  COMPARISON:  PET CT 03/22/2014.  CT 11/30/2013.  FINDINGS: CT NECK FINDINGS  No evidence of cervical adenopathy. Small scattered non pathologically enlarged bilateral level II lymph nodes are stable. Airways patent. Epiglottis and aryepiglottic folds are normal. Thyroid and salivary glands are unremarkable. Visualized orbital soft tissues unremarkable. Visualized paranasal sinuses and mastoids are clear.  CT CHEST FINDINGS  Heart is normal size. Aorta is normal caliber. No mediastinal, hilar, or axillary adenopathy. Chest wall soft tissues are unremarkable. Lungs are clear. No focal airspace opacities or suspicious nodules. No effusions.  CT ABDOMEN AND PELVIS FINDINGS  Liver, gallbladder, spleen, pancreas, adrenals and kidneys are normal. Uterus, adnexae and  urinary bladder unremarkable. Appendix is visualized and is normal. No free fluid, free air or adenopathy. Aorta is normal caliber.  No  acute bony abnormality or focal bone lesion throughout the neck, chest, abdomen or pelvis. Bilateral L5 pars defects noted.  IMPRESSION: No evidence of adenopathy in the neck, chest, abdomen or pelvis. No acute findings.   Electronically Signed   By: Rolm Baptise M.D.   On: 06/23/2014 11:46   Ct Abdomen Pelvis W Contrast  06/23/2014   CLINICAL DATA:  Followup treated high-grade and low grade lymphoma.  EXAM: CT NECK, CHEST, ABDOMEN AND PELVIS WITH CONTRAST  TECHNIQUE: Multidetector CT imaging of the neck, chest, abdomen and pelvis was performed using the standard protocol following the bolus administration of intravenous contrast.  CONTRAST:  184mL OMNIPAQUE IOHEXOL 300 MG/ML  SOLN  COMPARISON:  PET CT 03/22/2014.  CT 11/30/2013.  FINDINGS: CT NECK FINDINGS  No evidence of cervical adenopathy. Small scattered non pathologically enlarged bilateral level II lymph nodes are stable. Airways patent. Epiglottis and aryepiglottic folds are normal. Thyroid and salivary glands are unremarkable. Visualized orbital soft tissues unremarkable. Visualized paranasal sinuses and mastoids are clear.  CT CHEST FINDINGS  Heart is normal size. Aorta is normal caliber. No mediastinal, hilar, or axillary adenopathy. Chest wall soft tissues are unremarkable. Lungs are clear. No focal airspace opacities or suspicious nodules. No effusions.  CT ABDOMEN AND PELVIS FINDINGS  Liver, gallbladder, spleen, pancreas, adrenals and kidneys are normal. Uterus, adnexae and urinary bladder unremarkable. Appendix is visualized and is normal. No free fluid, free air or adenopathy. Aorta is normal caliber.  No acute bony abnormality or focal bone lesion throughout the neck, chest, abdomen or pelvis. Bilateral L5 pars defects noted.  IMPRESSION: No evidence of adenopathy in the neck, chest, abdomen or pelvis. No acute findings.   Electronically Signed   By: Rolm Baptise M.D.   On: 06/23/2014 11:46    Medications: I have reviewed the patient's current  medications.  Assessment/Plan: 1. High-grade B-cell non-Hodgkin's lymphoma presenting with pelvic pain found to have bulky pelvic lymphadenopathy January 2010. Status post 6 cycles of CHOP/Rituxan with a complete response. 2. 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 3. Chronic leukopenia. 4. Subcutaneous lesion right sternoclavicular notch-excisional biopsy 03/10/2014 confirmed a high-grade follicular lymphoma (grade 3A)  PET scan 03/22/2014. No clear evidence of active lymphoma. Extensive brown fat within the lower neck.   Restaging CT scans 06/23/2014-negative for lymphoma   Disposition:  There is no clinical or x-ray evidence for progression of the non-Hodgkin's lymphoma. The plan is to continue observation. She will return for an office visit in 3 months. Ms. Vantil received an influenza vaccine today. We will plan for a repeat biopsy and discuss treatment options at the time of clinical progression.  [name], MD  06/26/2014  11:32 AM

## 2014-07-17 ENCOUNTER — Other Ambulatory Visit: Payer: Self-pay | Admitting: Nurse Practitioner

## 2014-09-25 ENCOUNTER — Ambulatory Visit (HOSPITAL_BASED_OUTPATIENT_CLINIC_OR_DEPARTMENT_OTHER): Payer: 59 | Admitting: Nurse Practitioner

## 2014-09-25 ENCOUNTER — Other Ambulatory Visit (HOSPITAL_BASED_OUTPATIENT_CLINIC_OR_DEPARTMENT_OTHER): Payer: 59

## 2014-09-25 ENCOUNTER — Telehealth: Payer: Self-pay | Admitting: Nurse Practitioner

## 2014-09-25 VITALS — BP 117/68 | HR 54 | Temp 97.7°F | Resp 18 | Ht 65.0 in | Wt 156.6 lb

## 2014-09-25 DIAGNOSIS — C8590 Non-Hodgkin lymphoma, unspecified, unspecified site: Secondary | ICD-10-CM

## 2014-09-25 DIAGNOSIS — C8580 Other specified types of non-Hodgkin lymphoma, unspecified site: Secondary | ICD-10-CM

## 2014-09-25 DIAGNOSIS — C8510 Unspecified B-cell lymphoma, unspecified site: Secondary | ICD-10-CM

## 2014-09-25 LAB — CBC WITH DIFFERENTIAL/PLATELET
BASO%: 0.4 % (ref 0.0–2.0)
Basophils Absolute: 0 10*3/uL (ref 0.0–0.1)
EOS%: 5 % (ref 0.0–7.0)
Eosinophils Absolute: 0.2 10*3/uL (ref 0.0–0.5)
HCT: 40.5 % (ref 34.8–46.6)
HEMOGLOBIN: 13.4 g/dL (ref 11.6–15.9)
LYMPH%: 46.8 % (ref 14.0–49.7)
MCH: 30.9 pg (ref 25.1–34.0)
MCHC: 33.1 g/dL (ref 31.5–36.0)
MCV: 93.5 fL (ref 79.5–101.0)
MONO#: 0.3 10*3/uL (ref 0.1–0.9)
MONO%: 6.8 % (ref 0.0–14.0)
NEUT%: 41 % (ref 38.4–76.8)
NEUTROS ABS: 1.9 10*3/uL (ref 1.5–6.5)
Platelets: 173 10*3/uL (ref 145–400)
RBC: 4.33 10*6/uL (ref 3.70–5.45)
RDW: 12.5 % (ref 11.2–14.5)
WBC: 4.6 10*3/uL (ref 3.9–10.3)
lymph#: 2.1 10*3/uL (ref 0.9–3.3)

## 2014-09-25 LAB — LACTATE DEHYDROGENASE (CC13): LDH: 181 U/L (ref 125–245)

## 2014-09-25 NOTE — Progress Notes (Signed)
  [place] OFFICE PROGRESS NOTE   Diagnosis:  Non-Hodgkin's lymphoma  INTERVAL HISTORY:   Katelyn Irwin returns as scheduled. She feels well. No interim illnesses or infections. She has a good appetite. Weight is stable. No fevers or sweats. No enlarged lymph nodes. She denies pain.  Objective:  Vital signs in last 24 hours:  Blood pressure 117/68, pulse 54, temperature 97.7 F (36.5 C), temperature source Oral, resp. rate 18, height 5\' 5"  (1.651 m), weight 156 lb 9.6 oz (71.033 kg).    HEENT: No thrush or ulcers. Oropharynx without visible mass. No neck mass. Lymphatics: No palpable cervical, supra clavicular, axillary or inguinal lymph nodes. Resp: Lungs clear bilaterally. Cardio: Regular rate and rhythm. GI: Abdomen soft and nontender. No organomegaly. Vascular: No leg edema. Calves soft and nontender.  Lab Results:  Lab Results  Component Value Date   WBC 4.6 09/25/2014   HGB 13.4 09/25/2014   HCT 40.5 09/25/2014   MCV 93.5 09/25/2014   PLT 173 09/25/2014   NEUTROABS 1.9 09/25/2014   LDH pending Imaging:  No results found.  Medications: I have reviewed the patient's current medications.  Assessment/Plan: 1. High-grade B-cell non-Hodgkin's lymphoma presenting with pelvic pain found to have bulky pelvic lymphadenopathy January 2010. Status post 6 cycles of CHOP/Rituxan with a complete response. 2. 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 3. Chronic leukopenia. 4. Subcutaneous lesion right sternoclavicular notch-excisional biopsy 03/10/2014 confirmed a high-grade follicular lymphoma (grade 3A)  PET scan 03/22/2014. No clear evidence of active lymphoma. Extensive brown fat within the lower neck.   Restaging CT scans 06/23/2014-negative for lymphoma   Disposition: Katelyn Irwin  remains in clinical remission from non-Hodgkin's lymphoma. We will continue to follow with observation. She will return for an office visit and labs in 4 months. She will contact the office in the interim with any problems.  Plan reviewed with Dr. Benay Spice.    Ned Card ANP/GNP-BC   09/25/2014  9:45 AM

## 2014-09-25 NOTE — Telephone Encounter (Signed)
Pt confirmed labs/ov per 01/11 POF, gave pt AVS..... KJ  °

## 2015-01-22 ENCOUNTER — Telehealth: Payer: Self-pay | Admitting: Oncology

## 2015-01-22 ENCOUNTER — Ambulatory Visit (HOSPITAL_BASED_OUTPATIENT_CLINIC_OR_DEPARTMENT_OTHER): Payer: 59 | Admitting: Oncology

## 2015-01-22 ENCOUNTER — Other Ambulatory Visit (HOSPITAL_BASED_OUTPATIENT_CLINIC_OR_DEPARTMENT_OTHER): Payer: 59

## 2015-01-22 VITALS — BP 117/78 | HR 56 | Temp 97.8°F | Resp 20 | Ht 65.0 in | Wt 156.0 lb

## 2015-01-22 DIAGNOSIS — C8230 Follicular lymphoma grade IIIa, unspecified site: Secondary | ICD-10-CM | POA: Diagnosis not present

## 2015-01-22 DIAGNOSIS — C8510 Unspecified B-cell lymphoma, unspecified site: Secondary | ICD-10-CM

## 2015-01-22 DIAGNOSIS — C8590 Non-Hodgkin lymphoma, unspecified, unspecified site: Secondary | ICD-10-CM

## 2015-01-22 DIAGNOSIS — D72819 Decreased white blood cell count, unspecified: Secondary | ICD-10-CM

## 2015-01-22 LAB — CBC WITH DIFFERENTIAL/PLATELET
BASO%: 0.5 % (ref 0.0–2.0)
Basophils Absolute: 0 10*3/uL (ref 0.0–0.1)
EOS ABS: 0.2 10*3/uL (ref 0.0–0.5)
EOS%: 5.5 % (ref 0.0–7.0)
HCT: 39.1 % (ref 34.8–46.6)
HGB: 13.2 g/dL (ref 11.6–15.9)
LYMPH%: 48.6 % (ref 14.0–49.7)
MCH: 31.4 pg (ref 25.1–34.0)
MCHC: 33.8 g/dL (ref 31.5–36.0)
MCV: 92.9 fL (ref 79.5–101.0)
MONO#: 0.2 10*3/uL (ref 0.1–0.9)
MONO%: 5.3 % (ref 0.0–14.0)
NEUT#: 1.7 10*3/uL (ref 1.5–6.5)
NEUT%: 40.1 % (ref 38.4–76.8)
Platelets: 167 10*3/uL (ref 145–400)
RBC: 4.21 10*6/uL (ref 3.70–5.45)
RDW: 12.8 % (ref 11.2–14.5)
WBC: 4.2 10*3/uL (ref 3.9–10.3)
lymph#: 2 10*3/uL (ref 0.9–3.3)

## 2015-01-22 LAB — LACTATE DEHYDROGENASE (CC13): LDH: 173 U/L (ref 125–245)

## 2015-01-22 NOTE — Telephone Encounter (Signed)
Gave and printed appt sched and avs fo rpt; for NOV  °

## 2015-01-22 NOTE — Progress Notes (Signed)
  [place] OFFICE PROGRESS NOTE   Diagnosis: Non-Hodgkin's lymphoma  INTERVAL HISTORY:   Ms. Bucknam returns as scheduled. She feels well. Good appetite and energy level. No palpable lymph nodes. No complaint.  Objective:  Vital signs in last 24 hours:  Blood pressure 117/78, pulse 56, temperature 97.8 F (36.6 C), temperature source Oral, resp. rate 20, height 5\' 5"  (1.651 m), weight 156 lb (70.761 kg), SpO2 100 %.    HEENT: Neck without mass Lymphatics: No cervical, supraclavicular, axillary, or inguinal nodes Resp: Lungs clear bilaterally Cardio: Regular rate and rhythm GI: No hepatosplenomegaly, nontender, no mass Vascular: No leg edema   Lab Results:  Lab Results  Component Value Date   WBC 4.2 01/22/2015   HGB 13.2 01/22/2015   HCT 39.1 01/22/2015   MCV 92.9 01/22/2015   PLT 167 01/22/2015   NEUTROABS 1.7 01/22/2015   LDH 173  Medications: I have reviewed the patient's current medications.  Assessment/Plan: 1. High-grade B-cell non-Hodgkin's lymphoma presenting with pelvic pain found to have bulky pelvic lymphadenopathy January 2010. Status post 6 cycles of CHOP/Rituxan with a complete response. 2. 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 3. Chronic leukopenia. 4. Subcutaneous lesion right sternoclavicular notch-excisional biopsy 03/10/2014 confirmed a high-grade follicular lymphoma (grade 3A)  PET scan 03/22/2014. No clear evidence of active lymphoma. Extensive brown fat within the lower neck.   Restaging CT scans 06/23/2014-negative for lymphoma   Disposition:  Ms. Tangredi remains in clinical remission from non-Hodgkin's lymphoma. The plan is to continue observation. She will return for an office visit in 6 months. She will contact us in the interim for palpable lymph  nodes or new symptoms. We will discuss the indication for surveillance imaging when she returns in 6 months.  [name], MD  01/22/2015  9:23 AM

## 2015-01-23 ENCOUNTER — Telehealth: Payer: Self-pay | Admitting: *Deleted

## 2015-01-23 NOTE — Telephone Encounter (Signed)
Per Dr. Benay Spice; notified pt that LDH is normal.  Pt verbalized understanding.

## 2015-01-23 NOTE — Telephone Encounter (Signed)
-----   Message from Ladell Pier, MD sent at 01/22/2015  6:17 PM EDT ----- Please call patient, ldh is normal

## 2015-04-19 ENCOUNTER — Telehealth: Payer: Self-pay | Admitting: Family Medicine

## 2015-04-19 NOTE — Telephone Encounter (Signed)
Copy and give, no charge

## 2015-04-19 NOTE — Telephone Encounter (Signed)
Patient would like a copy of her records also.she would like to pick them up with her husband records today if possible.

## 2015-04-26 ENCOUNTER — Other Ambulatory Visit: Payer: Self-pay | Admitting: Obstetrics and Gynecology

## 2015-04-27 LAB — CYTOLOGY - PAP

## 2015-07-23 ENCOUNTER — Telehealth: Payer: Self-pay | Admitting: Oncology

## 2015-07-23 ENCOUNTER — Other Ambulatory Visit (HOSPITAL_BASED_OUTPATIENT_CLINIC_OR_DEPARTMENT_OTHER): Payer: 59

## 2015-07-23 ENCOUNTER — Ambulatory Visit (HOSPITAL_BASED_OUTPATIENT_CLINIC_OR_DEPARTMENT_OTHER): Payer: 59 | Admitting: Nurse Practitioner

## 2015-07-23 VITALS — BP 105/68 | HR 59 | Temp 97.9°F | Resp 16 | Ht 65.0 in | Wt 155.3 lb

## 2015-07-23 DIAGNOSIS — Z8572 Personal history of non-Hodgkin lymphomas: Secondary | ICD-10-CM

## 2015-07-23 DIAGNOSIS — Z23 Encounter for immunization: Secondary | ICD-10-CM | POA: Diagnosis not present

## 2015-07-23 DIAGNOSIS — C8510 Unspecified B-cell lymphoma, unspecified site: Secondary | ICD-10-CM

## 2015-07-23 LAB — CBC WITH DIFFERENTIAL/PLATELET
BASO%: 0.9 % (ref 0.0–2.0)
BASOS ABS: 0 10*3/uL (ref 0.0–0.1)
EOS%: 10.7 % — ABNORMAL HIGH (ref 0.0–7.0)
Eosinophils Absolute: 0.5 10*3/uL (ref 0.0–0.5)
HEMATOCRIT: 38.4 % (ref 34.8–46.6)
HGB: 12.9 g/dL (ref 11.6–15.9)
LYMPH%: 39.6 % (ref 14.0–49.7)
MCH: 31 pg (ref 25.1–34.0)
MCHC: 33.6 g/dL (ref 31.5–36.0)
MCV: 92.3 fL (ref 79.5–101.0)
MONO#: 0.3 10*3/uL (ref 0.1–0.9)
MONO%: 6.6 % (ref 0.0–14.0)
NEUT#: 2 10*3/uL (ref 1.5–6.5)
NEUT%: 42.2 % (ref 38.4–76.8)
PLATELETS: 180 10*3/uL (ref 145–400)
RBC: 4.16 10*6/uL (ref 3.70–5.45)
RDW: 12.7 % (ref 11.2–14.5)
WBC: 4.8 10*3/uL (ref 3.9–10.3)
lymph#: 1.9 10*3/uL (ref 0.9–3.3)

## 2015-07-23 LAB — LACTATE DEHYDROGENASE (CC13): LDH: 170 U/L (ref 125–245)

## 2015-07-23 MED ORDER — INFLUENZA VAC SPLIT QUAD 0.5 ML IM SUSY
0.5000 mL | PREFILLED_SYRINGE | Freq: Once | INTRAMUSCULAR | Status: AC
  Administered 2015-07-23: 0.5 mL via INTRAMUSCULAR
  Filled 2015-07-23: qty 0.5

## 2015-07-23 NOTE — Progress Notes (Addendum)
  [place] OFFICE PROGRESS NOTE   Diagnosis:  Non-Hodgkin's lymphoma  INTERVAL HISTORY:   Ms. Sporrer returns as scheduled. She feels well. No interim illnesses or infections. No palpable lymph nodes. No fevers or sweats. She denies pain. She has a good appetite and good energy level.  Objective:  Vital signs in last 24 hours:  Blood pressure 105/68, pulse 59, temperature 97.9 F (36.6 C), temperature source Oral, resp. rate 16, height 5\' 5"  (1.651 m), weight 155 lb 4.8 oz (70.444 kg), SpO2 100 %.    HEENT: No thrush or ulcers. Lymphatics: No palpable cervical, supra clavicular, axillary or inguinal lymph nodes. Resp: Lungs clear bilaterally. Cardio: Regular rate and rhythm. GI: Abdomen soft and nontender. No mass. No organomegaly. Vascular: No leg edema. Calves soft and nontender.   Lab Results:  Lab Results  Component Value Date   WBC 4.8 07/23/2015   HGB 12.9 07/23/2015   HCT 38.4 07/23/2015   MCV 92.3 07/23/2015   PLT 180 07/23/2015   NEUTROABS 2.0 07/23/2015    Imaging:  No results found.  Medications: I have reviewed the patient's current medications.  Assessment/Plan: 1. High-grade B-cell non-Hodgkin's lymphoma presenting with pelvic pain found to have bulky pelvic lymphadenopathy January 2010. Status post 6 cycles of CHOP/Rituxan with a complete response. 2. 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 3. Chronic leukopenia. 4. Subcutaneous lesion right sternoclavicular notch-excisional biopsy 03/10/2014 confirmed a high-grade follicular lymphoma (grade 3A)  PET scan 03/22/2014. No clear evidence of active lymphoma. Extensive brown fat within the lower neck.   Restaging CT scans 06/23/2014-negative for lymphoma   Disposition: Ms. Sumpter remains in clinical remission from  non-Hodgkin's lymphoma. The plan is to continue observation; CT scans if there is clinical change. She will receive the influenza vaccine today. She will return for a follow-up visit in 6 months. She will contact the office in the interim with any problems.  Patient seen with Dr. Benay Spice.    Ned Card ANP/GNP-BC   07/23/2015  9:01 AM  This was a shared visit with Ned Card. Ms Borys is in clinical remission from non-Hodgkin's lymphoma. I do not recommend surveillance CT scans.  [name], M.D.

## 2015-07-23 NOTE — Telephone Encounter (Signed)
Gave patient avs report and appointments for May 2017.  °

## 2016-01-21 ENCOUNTER — Telehealth: Payer: Self-pay | Admitting: *Deleted

## 2016-01-21 ENCOUNTER — Other Ambulatory Visit (HOSPITAL_BASED_OUTPATIENT_CLINIC_OR_DEPARTMENT_OTHER): Payer: 59

## 2016-01-21 ENCOUNTER — Ambulatory Visit (HOSPITAL_BASED_OUTPATIENT_CLINIC_OR_DEPARTMENT_OTHER): Payer: 59 | Admitting: Oncology

## 2016-01-21 ENCOUNTER — Ambulatory Visit (HOSPITAL_BASED_OUTPATIENT_CLINIC_OR_DEPARTMENT_OTHER): Payer: 59

## 2016-01-21 ENCOUNTER — Telehealth: Payer: Self-pay | Admitting: Oncology

## 2016-01-21 VITALS — BP 105/72 | HR 58 | Temp 98.1°F | Resp 20 | Ht 65.0 in | Wt 153.7 lb

## 2016-01-21 DIAGNOSIS — D72819 Decreased white blood cell count, unspecified: Secondary | ICD-10-CM | POA: Diagnosis not present

## 2016-01-21 DIAGNOSIS — C8590 Non-Hodgkin lymphoma, unspecified, unspecified site: Secondary | ICD-10-CM

## 2016-01-21 DIAGNOSIS — Z8572 Personal history of non-Hodgkin lymphomas: Secondary | ICD-10-CM

## 2016-01-21 DIAGNOSIS — Z09 Encounter for follow-up examination after completed treatment for conditions other than malignant neoplasm: Secondary | ICD-10-CM | POA: Diagnosis not present

## 2016-01-21 DIAGNOSIS — C8510 Unspecified B-cell lymphoma, unspecified site: Secondary | ICD-10-CM

## 2016-01-21 LAB — CBC WITH DIFFERENTIAL/PLATELET
BASO%: 0.9 % (ref 0.0–2.0)
Basophils Absolute: 0 10*3/uL (ref 0.0–0.1)
EOS ABS: 0.4 10*3/uL (ref 0.0–0.5)
EOS%: 7.7 % — ABNORMAL HIGH (ref 0.0–7.0)
HCT: 38.3 % (ref 34.8–46.6)
HGB: 12.8 g/dL (ref 11.6–15.9)
LYMPH%: 38.3 % (ref 14.0–49.7)
MCH: 30.6 pg (ref 25.1–34.0)
MCHC: 33.5 g/dL (ref 31.5–36.0)
MCV: 91.3 fL (ref 79.5–101.0)
MONO#: 0.3 10*3/uL (ref 0.1–0.9)
MONO%: 5.9 % (ref 0.0–14.0)
NEUT#: 2.4 10*3/uL (ref 1.5–6.5)
NEUT%: 47.2 % (ref 38.4–76.8)
Platelets: 214 10*3/uL (ref 145–400)
RBC: 4.19 10*6/uL (ref 3.70–5.45)
RDW: 12.6 % (ref 11.2–14.5)
WBC: 5 10*3/uL (ref 3.9–10.3)
lymph#: 1.9 10*3/uL (ref 0.9–3.3)

## 2016-01-21 LAB — TSH: TSH: 2.154 m(IU)/L (ref 0.308–3.960)

## 2016-01-21 LAB — LACTATE DEHYDROGENASE: LDH: 194 U/L (ref 125–245)

## 2016-01-21 NOTE — Telephone Encounter (Signed)
per pof to sch pt appt-gave pt copy of av °

## 2016-01-21 NOTE — Telephone Encounter (Signed)
-----   Message from Katelyn Pier, MD sent at 01/21/2016  4:38 PM EDT ----- Please call patient, Ldh and thyroid function are normal

## 2016-01-21 NOTE — Telephone Encounter (Signed)
Per Dr. Benay Spice, pt notified that LDH and thyroid function are normal.  Pt appreciative of call and has no questions at this time.

## 2016-01-21 NOTE — Progress Notes (Signed)
  [place] OFFICE PROGRESS NOTE   Diagnosis:  Non-Hodgkin's lymphoma  INTERVAL HISTORY:    Katelyn Irwin returns as scheduled. She feels well. No palpable lymph nodes. Good appetite.  Objective:  Vital signs in last 24 hours:  Blood pressure 105/72, pulse 58, temperature 98.1 F (36.7 C), temperature source Oral, resp. rate 20, height 5\' 5"  (1.651 m), weight 153 lb 11.2 oz (69.718 kg), SpO2 100 %.    HEENT:  Neck without mass Lymphatics:  No cervical, supra-clavicular, axillary, or inguinal nodes Resp:  Lungs clear bilaterally Cardio:  Regular in rhythm GI:  No hepatosplenic the, no mass Vascular:  No leg edema  Lab Results:  Lab Results  Component Value Date   WBC 5.0 01/21/2016   HGB 12.8 01/21/2016   HCT 38.3 01/21/2016   MCV 91.3 01/21/2016   PLT 214 01/21/2016   NEUTROABS 2.4 01/21/2016   Medications: I have reviewed the patient's current medications.  Assessment/Plan: 1. High-grade B-cell non-Hodgkin's lymphoma presenting with pelvic pain found to have bulky pelvic lymphadenopathy January 2010. Status post 6 cycles of CHOP/Rituxan with a complete response. 2. 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 3. Chronic leukopenia. 4. Subcutaneous lesion right sternoclavicular notch-excisional biopsy 03/10/2014 confirmed a high-grade follicular lymphoma (grade 3A)  PET scan 03/22/2014. No clear evidence of active lymphoma. Extensive brown fat within the lower neck.   Restaging CT scans 06/23/2014-negative for lymphoma   Disposition:   Katelyn Irwin appears stable. No clinical evidence for progression of lymphoma. She will contact us for new symptoms. She will return for an office visit in 6 months. We checked a TSH today given the history of neck radiation.  [name],  MD  01/21/2016  12:27 PM

## 2016-07-24 ENCOUNTER — Ambulatory Visit (HOSPITAL_BASED_OUTPATIENT_CLINIC_OR_DEPARTMENT_OTHER): Payer: 59 | Admitting: Oncology

## 2016-07-24 ENCOUNTER — Ambulatory Visit: Payer: 59

## 2016-07-24 ENCOUNTER — Telehealth: Payer: Self-pay | Admitting: Oncology

## 2016-07-24 VITALS — BP 108/78 | HR 56 | Temp 98.2°F | Resp 16 | Ht 65.0 in | Wt 155.2 lb

## 2016-07-24 DIAGNOSIS — D72819 Decreased white blood cell count, unspecified: Secondary | ICD-10-CM | POA: Diagnosis not present

## 2016-07-24 DIAGNOSIS — Z8572 Personal history of non-Hodgkin lymphomas: Secondary | ICD-10-CM | POA: Diagnosis not present

## 2016-07-24 DIAGNOSIS — Z23 Encounter for immunization: Secondary | ICD-10-CM

## 2016-07-24 MED ORDER — INFLUENZA VAC SPLIT QUAD 0.5 ML IM SUSY
0.5000 mL | PREFILLED_SYRINGE | Freq: Once | INTRAMUSCULAR | Status: AC
  Administered 2016-07-24: 0.5 mL via INTRAMUSCULAR
  Filled 2016-07-24: qty 0.5

## 2016-07-24 NOTE — Progress Notes (Signed)
  [place] OFFICE PROGRESS NOTE   Diagnosis: Non-Hodgkin's lymphoma  INTERVAL HISTORY:   [name] returns as scheduled. She feels well. No palpable lymph nodes. No fever or night sweats. No complaint.  Objective:  Vital signs in last 24 hours:  Blood pressure 108/78, pulse (!) 56, temperature 98.2 F (36.8 C), temperature source Oral, resp. rate 16, height 5\' 5"  (1.651 m), weight 155 lb 3.2 oz (70.4 kg), SpO2 100 %.    HEENT: Oropharynx without visible mass, neck without mass Lymphatics: No cervical, supra-clavicular, axillary, or inguinal nodes Resp: Lungs clear bilaterally Cardio: Regular rate and rhythm GI: No hepatosplenomegaly Vascular: No leg edema   Lab Results:  Lab Results  Component Value Date   WBC 5.0 01/21/2016   HGB 12.8 01/21/2016   HCT 38.3 01/21/2016   MCV 91.3 01/21/2016   PLT 214 01/21/2016   NEUTROABS 2.4 01/21/2016     Medications: I have reviewed the patient's current medications.  Assessment/Plan: 1. High-grade B-cell non-Hodgkin's lymphoma presenting with pelvic pain found to have bulky pelvic lymphadenopathy January 2010. Status post 6 cycles of CHOP/Rituxan with a complete response. 2. 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 3. Chronic leukopenia. 4. Subcutaneous lesion right sternoclavicular notch-excisional biopsy 03/10/2014 confirmed a high-grade follicular lymphoma (grade 3A)  PET scan 03/22/2014. No clear evidence of active lymphoma. Extensive brown fat within the lower neck.   Restaging CT scans 06/23/2014-negative for lymphoma    Disposition:  She remains in clinical remission from non-Hodgkin's lymphoma. She received an influenza vaccine today. [name] will return for an office visit in 6 months.  [name], MD  07/24/2016    12:28 PM

## 2016-07-24 NOTE — Telephone Encounter (Signed)
Appointments scheduled per 11/9 LOS. Patient given AVS report and calendars with future scheduled appointments. °

## 2016-08-19 ENCOUNTER — Other Ambulatory Visit: Payer: Self-pay | Admitting: Obstetrics and Gynecology

## 2016-08-20 LAB — CYTOLOGY - PAP

## 2017-01-20 ENCOUNTER — Ambulatory Visit (HOSPITAL_BASED_OUTPATIENT_CLINIC_OR_DEPARTMENT_OTHER): Payer: 59

## 2017-01-20 ENCOUNTER — Ambulatory Visit (HOSPITAL_BASED_OUTPATIENT_CLINIC_OR_DEPARTMENT_OTHER): Payer: 59 | Admitting: Nurse Practitioner

## 2017-01-20 ENCOUNTER — Ambulatory Visit: Payer: 59 | Admitting: Oncology

## 2017-01-20 ENCOUNTER — Telehealth: Payer: Self-pay | Admitting: Nurse Practitioner

## 2017-01-20 VITALS — BP 118/77 | HR 64 | Temp 98.5°F | Resp 18 | Ht 65.0 in | Wt 161.7 lb

## 2017-01-20 DIAGNOSIS — Z8572 Personal history of non-Hodgkin lymphomas: Secondary | ICD-10-CM | POA: Diagnosis not present

## 2017-01-20 DIAGNOSIS — C8590 Non-Hodgkin lymphoma, unspecified, unspecified site: Secondary | ICD-10-CM

## 2017-01-20 DIAGNOSIS — C8510 Unspecified B-cell lymphoma, unspecified site: Secondary | ICD-10-CM

## 2017-01-20 DIAGNOSIS — E039 Hypothyroidism, unspecified: Secondary | ICD-10-CM | POA: Diagnosis not present

## 2017-01-20 LAB — CBC WITH DIFFERENTIAL/PLATELET
BASO%: 0.9 % (ref 0.0–2.0)
Basophils Absolute: 0 10*3/uL (ref 0.0–0.1)
EOS%: 7 % (ref 0.0–7.0)
Eosinophils Absolute: 0.3 10*3/uL (ref 0.0–0.5)
HCT: 40.4 % (ref 34.8–46.6)
HGB: 13.7 g/dL (ref 11.6–15.9)
LYMPH%: 43.9 % (ref 14.0–49.7)
MCH: 31.1 pg (ref 25.1–34.0)
MCHC: 34 g/dL (ref 31.5–36.0)
MCV: 91.5 fL (ref 79.5–101.0)
MONO#: 0.3 10*3/uL (ref 0.1–0.9)
MONO%: 5.9 % (ref 0.0–14.0)
NEUT#: 2 10*3/uL (ref 1.5–6.5)
NEUT%: 42.3 % (ref 38.4–76.8)
PLATELETS: 202 10*3/uL (ref 145–400)
RBC: 4.41 10*6/uL (ref 3.70–5.45)
RDW: 12.7 % (ref 11.2–14.5)
WBC: 4.8 10*3/uL (ref 3.9–10.3)
lymph#: 2.1 10*3/uL (ref 0.9–3.3)

## 2017-01-20 LAB — COMPREHENSIVE METABOLIC PANEL
ALBUMIN: 4.4 g/dL (ref 3.5–5.0)
ALK PHOS: 30 U/L — AB (ref 40–150)
ALT: 32 U/L (ref 0–55)
ANION GAP: 10 meq/L (ref 3–11)
AST: 23 U/L (ref 5–34)
BUN: 17.9 mg/dL (ref 7.0–26.0)
CO2: 28 meq/L (ref 22–29)
Calcium: 10.4 mg/dL (ref 8.4–10.4)
Chloride: 105 mEq/L (ref 98–109)
Creatinine: 0.9 mg/dL (ref 0.6–1.1)
EGFR: 75 mL/min/{1.73_m2} — AB (ref >90)
Glucose: 94 mg/dl (ref 70–140)
POTASSIUM: 4.2 meq/L (ref 3.5–5.1)
Sodium: 142 mEq/L (ref 136–145)
Total Bilirubin: 1.46 mg/dL — ABNORMAL HIGH (ref 0.20–1.20)
Total Protein: 7.3 g/dL (ref 6.4–8.3)

## 2017-01-20 LAB — LACTATE DEHYDROGENASE: LDH: 198 U/L (ref 125–245)

## 2017-01-20 LAB — TSH: TSH: 1.864 m(IU)/L (ref 0.308–3.960)

## 2017-01-20 NOTE — Progress Notes (Signed)
  [place] OFFICE PROGRESS NOTE   Diagnosis:  Non-Hodgkin's lymphoma  INTERVAL HISTORY:   Katelyn Irwin returns as scheduled. She feels well. She has a good appetite. No fevers or sweats. She noted fullness in the right axilla one day last week. No discrete mass. The full sensation has resolved. She denies pain. No shortness of breath.  Objective:  Vital signs in last 24 hours:  Blood pressure 118/77, pulse 64, temperature 98.5 F (36.9 C), temperature source Oral, resp. rate 18, height 5\' 5"  (1.651 m), weight 161 lb 11.2 oz (73.3 kg), SpO2 100 %.    HEENT: neck without mass. Lymphatics: no palpable cervical, supraclavicular, axillary or inguinal lymph nodes. Resp: lungs clear bilaterally. Cardio: regular rate and rhythm. GI: abdomen soft, nontender. No organomegaly. Vascular: no leg edema.   Lab Results:  Lab Results  Component Value Date   WBC 5.0 01/21/2016   HGB 12.8 01/21/2016   HCT 38.3 01/21/2016   MCV 91.3 01/21/2016   PLT 214 01/21/2016   NEUTROABS 2.4 01/21/2016    Imaging:  No results found.  Medications: I have reviewed the patient's current medications.  Assessment/Plan: 1. High-grade B-cell non-Hodgkin's lymphoma presenting with pelvic pain found to have bulky pelvic lymphadenopathy January 2010. Status post 6 cycles of CHOP/Rituxan with a complete response. 2. 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 3. Chronic leukopenia. 4. Subcutaneous lesion right sternoclavicular notch-excisional biopsy 03/10/2014 confirmed a high-grade follicular lymphoma (grade 3A)  PET scan 03/22/2014. No clear evidence of active lymphoma. Extensive brown fat within the lower neck.   Restaging CT scans 06/23/2014-negative for lymphoma   Disposition: Katelyn Irwin remains in clinical  remission from non-Hodgkin's lymphoma. She will return to the lab today for a TSH due to previous radiation.  She noted fullness in the right axilla last week. This has resolved. She understands to contact the office if this recurs or she develops palpable adenopathy.  She will return for a follow-up visit in 6 months. She will contact the office in the interim with any problems.    Ned Card ANP/GNP-BC   01/20/2017  12:49 PM

## 2017-01-20 NOTE — Telephone Encounter (Signed)
Appointments scheduled per 01/20/17 LOS. Patient given AVS report and calendars with future scheduled appointments.  °

## 2017-01-21 ENCOUNTER — Telehealth: Payer: Self-pay | Admitting: *Deleted

## 2017-01-21 NOTE — Telephone Encounter (Signed)
Telephone call to patient- advised lab results per message below. Pt verbalized an understanding and will call this office with any concerns or questions

## 2017-01-21 NOTE — Telephone Encounter (Signed)
-----   Message from Owens Shark, NP sent at 01/21/2017  1:48 PM EDT ----- Please let her know thyroid test is normal.

## 2017-02-10 ENCOUNTER — Ambulatory Visit: Payer: 59

## 2017-07-21 ENCOUNTER — Telehealth: Payer: Self-pay | Admitting: Nurse Practitioner

## 2017-07-21 ENCOUNTER — Ambulatory Visit (HOSPITAL_BASED_OUTPATIENT_CLINIC_OR_DEPARTMENT_OTHER): Payer: 59 | Admitting: Oncology

## 2017-07-21 VITALS — BP 122/72 | HR 60 | Temp 98.8°F | Resp 18 | Ht 65.0 in | Wt 161.9 lb

## 2017-07-21 DIAGNOSIS — Z23 Encounter for immunization: Secondary | ICD-10-CM | POA: Diagnosis not present

## 2017-07-21 DIAGNOSIS — Z8572 Personal history of non-Hodgkin lymphomas: Secondary | ICD-10-CM

## 2017-07-21 MED ORDER — INFLUENZA VAC SPLIT QUAD 0.5 ML IM SUSY
0.5000 mL | PREFILLED_SYRINGE | Freq: Once | INTRAMUSCULAR | Status: AC
  Administered 2017-07-21: 0.5 mL via INTRAMUSCULAR
  Filled 2017-07-21: qty 0.5

## 2017-07-21 NOTE — Telephone Encounter (Signed)
Gave avs and calendar for May 2019 °

## 2017-07-21 NOTE — Progress Notes (Signed)
  [place] OFFICE PROGRESS NOTE   Diagnosis: Non-Hodgkin's lymphoma  INTERVAL HISTORY:   Ms. Scalisi returns as scheduled.  She feels well.  No palpable lymph nodes.  Good appetite.  No fever or night sweats.  No complaint.  Objective:  Vital signs in last 24 hours:  Blood pressure 122/72, pulse 60, temperature 98.8 F (37.1 C), temperature source Oral, resp. rate 18, height 5\' 5"  (1.651 m), weight 161 lb 14.4 oz (73.4 kg), SpO2 98 %.    HEENT: Neck without mass Lymphatics: No cervical, supraclavicular, axillary, or inguinal nodes Resp: Lungs clear bilaterally Cardio: Regular rate and rhythm GI: No hepatosplenomegaly no mass Vascular: No leg edema   Lab Results:  Lab Results  Component Value Date   WBC 4.8 01/20/2017   HGB 13.7 01/20/2017   HCT 40.4 01/20/2017   MCV 91.5 01/20/2017   PLT 202 01/20/2017   NEUTROABS 2.0 01/20/2017    Medications: I have reviewed the patient's current medications.  Assessment/Plan: 1. High-grade B-cell non-Hodgkin's lymphoma presenting with pelvic pain found to have bulky pelvic lymphadenopathy January 2010. Status post 6 cycles of CHOP/Rituxan with a complete response. 2. 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 3. Chronic leukopenia. 4. Subcutaneous lesion right sternoclavicular notch-excisional biopsy 03/10/2014 confirmed a high-grade follicular lymphoma (grade 3A)  PET scan 03/22/2014. No clear evidence of active lymphoma. Extensive brown fat within the lower neck.   Restaging CT scans 06/23/2014-negative for lymphoma   Disposition:  Ms. Malak remains in clinical remission from non-Hodgkin's lymphoma.  She received an influenza vaccine today.  She will return for an office visit in 6 months.  She will contact us in the interim for new  symptoms.  15 minutes were spent with the patient today.  The majority of the time was used for counseling and coordination of care.  Donneta Romberg, MD  07/21/2017  11:52 AM

## 2018-01-19 ENCOUNTER — Inpatient Hospital Stay: Payer: BLUE CROSS/BLUE SHIELD | Attending: Nurse Practitioner | Admitting: Nurse Practitioner

## 2018-01-19 ENCOUNTER — Telehealth: Payer: Self-pay

## 2018-01-19 ENCOUNTER — Encounter: Payer: Self-pay | Admitting: Gastroenterology

## 2018-01-19 ENCOUNTER — Encounter: Payer: Self-pay | Admitting: Nurse Practitioner

## 2018-01-19 ENCOUNTER — Inpatient Hospital Stay: Payer: BLUE CROSS/BLUE SHIELD

## 2018-01-19 VITALS — BP 102/73 | HR 60 | Temp 98.6°F | Resp 18 | Ht 69.0 in | Wt 160.4 lb

## 2018-01-19 DIAGNOSIS — Z923 Personal history of irradiation: Secondary | ICD-10-CM | POA: Diagnosis not present

## 2018-01-19 DIAGNOSIS — C8510 Unspecified B-cell lymphoma, unspecified site: Secondary | ICD-10-CM

## 2018-01-19 DIAGNOSIS — Z8572 Personal history of non-Hodgkin lymphomas: Secondary | ICD-10-CM | POA: Diagnosis not present

## 2018-01-19 DIAGNOSIS — D72819 Decreased white blood cell count, unspecified: Secondary | ICD-10-CM

## 2018-01-19 DIAGNOSIS — Z9221 Personal history of antineoplastic chemotherapy: Secondary | ICD-10-CM

## 2018-01-19 LAB — TSH: TSH: 2.02 u[IU]/mL (ref 0.308–3.960)

## 2018-01-19 NOTE — Telephone Encounter (Signed)
Printed avs and calender of upcoming appointment. Per 5/7 los 

## 2018-01-19 NOTE — Telephone Encounter (Addendum)
Spoke with pt advised that TSH was norm. Per note below. Pt verbalized understanding.  ----- Message from Owens Shark, NP sent at 01/19/2018  3:30 PM EDT ----- Please let her know TSH is normal.

## 2018-01-19 NOTE — Progress Notes (Signed)
  [place] OFFICE PROGRESS NOTE   Diagnosis: Non-Hodgkin's lymphoma  INTERVAL HISTORY:   Katelyn Irwin returns as scheduled.  She feels well.  No interim illnesses or infections.  No enlarged lymph nodes.  No fevers or sweats.  She has a good appetite.  No change in bowel habits.  Objective:  Vital signs in last 24 hours:  Blood pressure 102/73, pulse 60, temperature 98.6 F (37 C), temperature source Oral, resp. rate 18, height 5\' 9"  (1.753 m), weight 160 lb 6.4 oz (72.8 kg), SpO2 100 %.    HEENT: Neck without mass. Lymphatics: No palpable cervical, supraclavicular, axillary or inguinal lymph nodes. Resp: Lungs clear bilaterally. Cardio: Regular rate and rhythm. GI: Abdomen soft and nontender.  No hepatomegaly. Vascular: No leg edema.   Lab Results:  Lab Results  Component Value Date   WBC 4.8 01/20/2017   HGB 13.7 01/20/2017   HCT 40.4 01/20/2017   MCV 91.5 01/20/2017   PLT 202 01/20/2017   NEUTROABS 2.0 01/20/2017    Imaging:  No results found.  Medications: I have reviewed the patient's current medications.  Assessment/Plan: 1. High-grade B-cell non-Hodgkin's lymphoma presenting with pelvic pain found to have bulky pelvic lymphadenopathy January 2010. Status post 6 cycles of CHOP/Rituxan with a complete response. 2. 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 3. Chronic leukopenia. 4. Subcutaneous lesion right sternoclavicular notch-excisional biopsy 03/10/2014 confirmed a high-grade follicular lymphoma (grade 3A)  PET scan 03/22/2014. No clear evidence of active lymphoma. Extensive brown fat within the lower neck.   Restaging CT scans 06/23/2014-negative for lymphoma     Disposition: Katelyn Irwin remains in clinical remission from non-Hodgkin's lymphoma.  She will return to  the lab today for a TSH due to previous neck radiation.  We referred her for a screening colonoscopy.  She will return for a follow-up visit in 8 months.  She will contact the office in the interim with any problems.  Plan reviewed with Dr. Benay Spice.    Ned Card ANP/GNP-BC   01/19/2018  10:57 AM

## 2018-03-12 ENCOUNTER — Ambulatory Visit (AMBULATORY_SURGERY_CENTER): Payer: Self-pay

## 2018-03-12 VITALS — Ht 66.0 in | Wt 158.4 lb

## 2018-03-12 DIAGNOSIS — Z1211 Encounter for screening for malignant neoplasm of colon: Secondary | ICD-10-CM

## 2018-03-12 MED ORDER — PEG-KCL-NACL-NASULF-NA ASC-C 140 G PO SOLR: 1.0000 | Freq: Once | ORAL | Status: AC

## 2018-03-12 NOTE — Progress Notes (Signed)
Per pt, no allergies to soy or egg products.Pt not taking any weight loss meds or using  O2 at home.  Pt refused emmi video. 

## 2018-03-25 ENCOUNTER — Ambulatory Visit (AMBULATORY_SURGERY_CENTER): Payer: BLUE CROSS/BLUE SHIELD | Admitting: Gastroenterology

## 2018-03-25 ENCOUNTER — Encounter: Payer: Self-pay | Admitting: Gastroenterology

## 2018-03-25 ENCOUNTER — Other Ambulatory Visit: Payer: Self-pay

## 2018-03-25 VITALS — BP 119/74 | HR 65 | Temp 98.6°F | Resp 14 | Ht 66.0 in | Wt 158.0 lb

## 2018-03-25 DIAGNOSIS — Z1212 Encounter for screening for malignant neoplasm of rectum: Secondary | ICD-10-CM

## 2018-03-25 DIAGNOSIS — Z1211 Encounter for screening for malignant neoplasm of colon: Secondary | ICD-10-CM

## 2018-03-25 MED ORDER — SODIUM CHLORIDE 0.9 % IV SOLN: 500.0000 mL | Freq: Once | INTRAVENOUS | Status: DC

## 2018-03-25 NOTE — Patient Instructions (Signed)
YOU HAD AN ENDOSCOPIC PROCEDURE TODAY AT THE [facility]:   Refer to the procedure report that was given to you for any specific questions about what was found during the examination.  If the procedure report does not answer your questions, please call your gastroenterologist to clarify.  If you requested that your care partner not be given the details of your procedure findings, then the procedure report has been included in a sealed envelope for you to review at your convenience later.  YOU SHOULD EXPECT: Some feelings of bloating in the abdomen. Passage of more gas than usual.  Walking can help get rid of the air that was put into your GI tract during the procedure and reduce the bloating. If you had a lower endoscopy (such as a colonoscopy or flexible sigmoidoscopy) you may notice spotting of blood in your stool or on the toilet paper. If you underwent a bowel prep for your procedure, you may not have a normal bowel movement for a few days.  Please Note:  You might notice some irritation and congestion in your nose or some drainage.  This is from the oxygen used during your procedure.  There is no need for concern and it should clear up in a day or so.  SYMPTOMS TO REPORT IMMEDIATELY:   Following lower endoscopy (colonoscopy or flexible sigmoidoscopy):  Excessive amounts of blood in the stool  Significant tenderness or worsening of abdominal pains  Swelling of the abdomen that is new, acute  Fever of 100F or higher   For urgent or emergent issues, a gastroenterologist can be reached at any hour by calling [phone].   DIET:  We do recommend a small meal at first, but then you may proceed to your regular diet.  Drink plenty of fluids but you should avoid alcoholic beverages for 24 hours. Try to increase the fiber in your diet, and drink plenty of water.  ACTIVITY:  You should plan to take it easy for the rest of today and you should NOT DRIVE or use heavy machinery until  tomorrow (because of the sedation medicines used during the test).    FOLLOW UP: Our staff will call the number listed on your records the next business day following your procedure to check on you and address any questions or concerns that you may have regarding the information given to you following your procedure. If we do not reach you, we will leave a message.  However, if you are feeling well and you are not experiencing any problems, there is no need to return our call.  We will assume that you have returned to your regular daily activities without incident.  If any biopsies were taken you will be contacted by phone or by letter within the next 1-3 weeks.  Please call us at [phone] if you have not heard about the biopsies in 3 weeks.    SIGNATURES/CONFIDENTIALITY: You and/or your care partner have signed paperwork which will be entered into your electronic medical record.  These signatures attest to the fact that that the information above on your After Visit Summary has been reviewed and is understood.  Full responsibility of the confidentiality of this discharge information lies with you and/or your care-partner.  Read all of the handouts given to you by your recovery room nurse. 

## 2018-03-25 NOTE — Progress Notes (Signed)
Pt. Reports no change in her medical or surgical history since her pre-visit 03/12/2018.

## 2018-03-25 NOTE — Progress Notes (Signed)
Report given to PACU, vss 

## 2018-03-25 NOTE — Op Note (Signed)
[place] Patient Name: Katelyn Irwin Procedure Date: 03/25/2018 11:23 AM MRN: [id number] Endoscopist: [place]. Loletha Irwin , MD Age: 52 Referring MD:  Date of Birth: 1965-11-02 Gender: Female Account #: [id number] Procedure:                Colonoscopy Indications:              Screening for colorectal malignant neoplasm, This                            is the patient's first colonoscopy Medicines:                Monitored Anesthesia Care Procedure:                Pre-Anesthesia Assessment:                           - Prior to the procedure, a History and Physical                            was performed, and patient medications and                            allergies were reviewed. The patient's tolerance of                            previous anesthesia was also reviewed. The risks                            and benefits of the procedure and the sedation                            options and risks were discussed with the patient.                            All questions were answered, and informed consent                            was obtained. Prior Anticoagulants: The patient has                            taken no previous anticoagulant or antiplatelet                            agents. ASA Grade Assessment: I - A normal, healthy                            patient. After reviewing the risks and benefits,                            the patient was deemed in satisfactory condition to                            undergo the procedure.  After obtaining informed consent, the colonoscope                            was passed under direct vision. Throughout the                            procedure, the patient's blood pressure, pulse, and                            oxygen saturations were monitored continuously. The                            Colonoscope was introduced through the anus and                            advanced to the the cecum, identified  by                            appendiceal orifice and ileocecal valve. The                            colonoscopy was performed without difficulty. The                            patient tolerated the procedure well. The quality                            of the bowel preparation was excellent. The                            ileocecal valve, appendiceal orifice, and rectum                            were photographed. Scope In: 11:32:01 AM Scope Out: 11:43:55 AM Scope Withdrawal Time: 0 hours 9 minutes 48 seconds  Total Procedure Duration: 0 hours 11 minutes 54 seconds  Findings:                 The perianal and digital rectal examinations were                            normal.                           Multiple small-mouthed diverticula were found in                            the left colon.                           Retroflexion in the rectum was not performed due to                            anatomy.  The exam was otherwise without abnormality. Complications:            No immediate complications. Estimated Blood Loss:     Estimated blood loss: none. Impression:               - Diverticulosis in the left colon.                           - The examination was otherwise normal.                           - No specimens collected. Recommendation:           - Patient has a contact number available for                            emergencies. The signs and symptoms of potential                            delayed complications were discussed with the                            patient. Return to normal activities tomorrow.                            Written discharge instructions were provided to the                            patient.                           - Resume previous diet.                           - Continue present medications.                           - Repeat colonoscopy in 10 years for screening                            purposes. Katelyn L.  Loletha Carrow, MD 03/25/2018 11:46:32 AM This report has been signed electronically.

## 2018-03-26 ENCOUNTER — Telehealth: Payer: Self-pay | Admitting: *Deleted

## 2018-03-26 NOTE — Telephone Encounter (Signed)
  Follow up Call-  Call back number 03/25/2018  Post procedure Call Back phone  # [phone]  Permission to leave phone message Yes  Some recent data might be hidden     Patient questions:  Do you have a fever, pain , or abdominal swelling? No. Pain Score  0 *  Have you tolerated food without any problems? Yes.    Have you been able to return to your normal activities? Yes.    Do you have any questions about your discharge instructions: Diet   No. Medications  No. Follow up visit  No.  Do you have questions or concerns about your Care? No.  Actions: * If pain score is 4 or above: No action needed, pain <4.

## 2018-08-25 ENCOUNTER — Telehealth: Payer: Self-pay | Admitting: Oncology

## 2018-08-25 NOTE — Telephone Encounter (Signed)
R/S appt due to GBS being on call. Left vm for pt re appt change.

## 2018-09-16 ENCOUNTER — Ambulatory Visit: Payer: BLUE CROSS/BLUE SHIELD | Admitting: Oncology

## 2018-09-21 ENCOUNTER — Telehealth: Payer: Self-pay | Admitting: Oncology

## 2018-09-21 ENCOUNTER — Ambulatory Visit: Payer: BLUE CROSS/BLUE SHIELD | Admitting: Oncology

## 2018-09-21 NOTE — Telephone Encounter (Signed)
Called to confirm appt per 1/2 sch message - pt is aware of apt date and time

## 2018-11-01 ENCOUNTER — Telehealth: Payer: Self-pay | Admitting: Oncology

## 2018-11-01 ENCOUNTER — Inpatient Hospital Stay: Payer: BLUE CROSS/BLUE SHIELD | Attending: Oncology | Admitting: Oncology

## 2018-11-01 VITALS — BP 108/78 | HR 60 | Temp 98.0°F | Resp 19 | Wt 162.6 lb

## 2018-11-01 DIAGNOSIS — Z9221 Personal history of antineoplastic chemotherapy: Secondary | ICD-10-CM | POA: Insufficient documentation

## 2018-11-01 DIAGNOSIS — Z23 Encounter for immunization: Secondary | ICD-10-CM | POA: Diagnosis present

## 2018-11-01 DIAGNOSIS — Z923 Personal history of irradiation: Secondary | ICD-10-CM | POA: Diagnosis not present

## 2018-11-01 DIAGNOSIS — C8510 Unspecified B-cell lymphoma, unspecified site: Secondary | ICD-10-CM

## 2018-11-01 DIAGNOSIS — Z8572 Personal history of non-Hodgkin lymphomas: Secondary | ICD-10-CM | POA: Diagnosis not present

## 2018-11-01 DIAGNOSIS — D72819 Decreased white blood cell count, unspecified: Secondary | ICD-10-CM | POA: Diagnosis not present

## 2018-11-01 MED ORDER — INFLUENZA VAC SPLIT QUAD 0.5 ML IM SUSY
0.5000 mL | PREFILLED_SYRINGE | Freq: Once | INTRAMUSCULAR | Status: AC
  Administered 2018-11-01: 0.5 mL via INTRAMUSCULAR

## 2018-11-01 MED ORDER — INFLUENZA VAC SPLIT QUAD 0.5 ML IM SUSY
PREFILLED_SYRINGE | INTRAMUSCULAR | Status: AC
  Filled 2018-11-01: qty 0.5

## 2018-11-01 NOTE — Telephone Encounter (Signed)
Scheduled appt per 2/17 los - sent reminder letter in the mail with appt date and time   

## 2018-11-01 NOTE — Progress Notes (Signed)
     [place] OFFICE PROGRESS NOTE   Diagnosis: Non-Hodgkin's lymphoma  INTERVAL HISTORY:   Ms. Kawa returns for a scheduled visit.  She feels well.  Good appetite and energy level.  No palpable lymph nodes.  Objective:  Vital signs in last 24 hours:  Blood pressure 108/78, pulse 60, temperature 98 F (36.7 C), temperature source Oral, resp. rate 19, weight 162 lb 9.6 oz (73.8 kg), SpO2 100 %.    HEENT: Neck without mass Lymphatics: No cervical, supraclavicular, axillary, or inguinal nodes Resp: Lungs clear bilaterally Cardio: Regular rate and rhythm GI: No hepatosplenomegaly, no mass, nontender Vascular: No leg edema   Medications: I have reviewed the patient's current medications.   Assessment/Plan: 1. High-grade B-cell non-Hodgkin's lymphoma presenting with pelvic pain found to have bulky pelvic lymphadenopathy January 2010. Status post 6 cycles of CHOP/Rituxan with a complete response. 2. 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 3. Chronic leukopenia. 4. Subcutaneous lesion right sternoclavicular notch-excisional biopsy 03/10/2014 confirmed a high-grade follicular lymphoma (grade 3A)  PET scan 03/22/2014. No clear evidence of active lymphoma. Extensive brown fat within the lower neck.   Restaging CT scans 06/23/2014-negative for lymphoma     Disposition: Ms. Dechert is in clinical remission from non-Hodgkin's lymphoma.  She received an influenza vaccine today.  She will return for an office visit in 9 months.  She will contact us in the interim for palpable lymph nodes or new symptoms.  15 minutes were spent with the patient today.  The majority of the time was used for counseling and coordination of care.  [name], MD  11/01/2018  3:13 PM

## 2019-08-02 ENCOUNTER — Encounter: Payer: Self-pay | Admitting: Nurse Practitioner

## 2019-08-02 ENCOUNTER — Other Ambulatory Visit: Payer: Self-pay

## 2019-08-02 ENCOUNTER — Inpatient Hospital Stay: Payer: BC Managed Care – PPO

## 2019-08-02 ENCOUNTER — Inpatient Hospital Stay: Payer: BC Managed Care – PPO | Attending: Nurse Practitioner | Admitting: Nurse Practitioner

## 2019-08-02 VITALS — BP 127/76 | HR 61 | Temp 97.9°F | Resp 18 | Ht 66.0 in | Wt 162.6 lb

## 2019-08-02 DIAGNOSIS — C8510 Unspecified B-cell lymphoma, unspecified site: Secondary | ICD-10-CM | POA: Diagnosis not present

## 2019-08-02 DIAGNOSIS — C8282 Other types of follicular lymphoma, intrathoracic lymph nodes: Secondary | ICD-10-CM | POA: Insufficient documentation

## 2019-08-02 DIAGNOSIS — Z23 Encounter for immunization: Secondary | ICD-10-CM | POA: Diagnosis not present

## 2019-08-02 DIAGNOSIS — C8516 Unspecified B-cell lymphoma, intrapelvic lymph nodes: Secondary | ICD-10-CM | POA: Insufficient documentation

## 2019-08-02 DIAGNOSIS — D72819 Decreased white blood cell count, unspecified: Secondary | ICD-10-CM | POA: Diagnosis present

## 2019-08-02 DIAGNOSIS — Z79899 Other long term (current) drug therapy: Secondary | ICD-10-CM | POA: Diagnosis not present

## 2019-08-02 MED ORDER — INFLUENZA VAC SPLIT QUAD 0.5 ML IM SUSY
0.5000 mL | PREFILLED_SYRINGE | Freq: Once | INTRAMUSCULAR | Status: AC
  Administered 2019-08-02: 0.5 mL via INTRAMUSCULAR

## 2019-08-02 NOTE — Progress Notes (Signed)
  [place] OFFICE PROGRESS NOTE   Diagnosis: Non-Hodgkin's lymphoma  INTERVAL HISTORY:   Katelyn Irwin returns as scheduled.  She feels well.  No fevers or sweats.  She has good appetite.  No weight loss.  She denies pain.  Objective:  Vital signs in last 24 hours:  Blood pressure 127/76, pulse 61, temperature 97.9 F (36.6 C), resp. rate 18, height 5\' 6"  (1.676 m), weight 162 lb 9.6 oz (73.8 kg), SpO2 100 %.    HEENT: Neck without mass. Lymphatics: No palpable cervical, supraclavicular, axillary or inguinal lymph nodes. GI: Abdomen soft and nontender.  No hepatosplenomegaly. Vascular: No leg edema. Neuro: Alert and oriented. Skin: No rash.   Lab Results:  Lab Results  Component Value Date   WBC 4.8 01/20/2017   HGB 13.7 01/20/2017   HCT 40.4 01/20/2017   MCV 91.5 01/20/2017   PLT 202 01/20/2017   NEUTROABS 2.0 01/20/2017    Imaging:  No results found.  Medications: I have reviewed the patient's current medications.  Assessment/Plan: 1. High-grade B-cell non-Hodgkin's lymphoma presenting with pelvic pain found to have bulky pelvic lymphadenopathy January 2010. Status post 6 cycles of CHOP/Rituxan with a complete response. 2. 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 3. Chronic leukopenia. 4. Subcutaneous lesion right sternoclavicular notch-excisional biopsy 03/10/2014 confirmed a high-grade follicular lymphoma (grade 3A)  PET scan 03/22/2014. No clear evidence of active lymphoma. Extensive brown fat within the lower neck.   Restaging CT scans 06/23/2014-negative for lymphoma   Disposition: Katelyn Irwin remains in clinical remission from non-Hodgkin's lymphoma.  We will follow-up on the TSH from today.  She will receive the influenza vaccine today.  We will see her in  follow-up in 9 months.  She will contact the office in the interim with any problems.    Ned Card ANP/GNP-BC   08/02/2019  3:41 PM

## 2019-08-03 ENCOUNTER — Telehealth: Payer: Self-pay | Admitting: Nurse Practitioner

## 2019-08-03 LAB — TSH: TSH: 2.698 u[IU]/mL (ref 0.308–3.960)

## 2019-08-03 NOTE — Telephone Encounter (Signed)
Scheduled per los. Called and left msg. Mailed printout  °

## 2019-08-04 ENCOUNTER — Telehealth: Payer: Self-pay | Admitting: *Deleted

## 2019-08-04 NOTE — Telephone Encounter (Signed)
-----   Message from Owens Shark, NP sent at 08/04/2019  8:40 AM EST ----- Please let her know the TSH is in normal range.  Follow-up as scheduled.

## 2019-08-04 NOTE — Telephone Encounter (Signed)
Per Ned Card, NP, called and pt aware that TSH is within normal range and to follow-up as scheduled. Pt verbalized understanding

## 2019-10-19 ENCOUNTER — Encounter: Payer: Self-pay | Admitting: Family Medicine

## 2019-10-21 DIAGNOSIS — R87619 Unspecified abnormal cytological findings in specimens from cervix uteri: Secondary | ICD-10-CM | POA: Insufficient documentation

## 2019-10-25 ENCOUNTER — Telehealth: Payer: Self-pay

## 2019-10-25 ENCOUNTER — Encounter: Payer: Self-pay | Admitting: Nurse Practitioner

## 2019-10-25 ENCOUNTER — Other Ambulatory Visit: Payer: Self-pay

## 2019-10-25 ENCOUNTER — Inpatient Hospital Stay: Payer: BC Managed Care – PPO | Attending: Nurse Practitioner | Admitting: Nurse Practitioner

## 2019-10-25 VITALS — BP 121/82 | HR 68 | Temp 98.7°F | Resp 17 | Ht 66.0 in | Wt 162.0 lb

## 2019-10-25 DIAGNOSIS — Z79899 Other long term (current) drug therapy: Secondary | ICD-10-CM | POA: Diagnosis not present

## 2019-10-25 DIAGNOSIS — C8510 Unspecified B-cell lymphoma, unspecified site: Secondary | ICD-10-CM | POA: Diagnosis not present

## 2019-10-25 DIAGNOSIS — C8516 Unspecified B-cell lymphoma, intrapelvic lymph nodes: Secondary | ICD-10-CM | POA: Diagnosis not present

## 2019-10-25 DIAGNOSIS — R220 Localized swelling, mass and lump, head: Secondary | ICD-10-CM | POA: Diagnosis not present

## 2019-10-25 DIAGNOSIS — C8282 Other types of follicular lymphoma, intrathoracic lymph nodes: Secondary | ICD-10-CM | POA: Diagnosis present

## 2019-10-25 DIAGNOSIS — D72819 Decreased white blood cell count, unspecified: Secondary | ICD-10-CM | POA: Insufficient documentation

## 2019-10-25 NOTE — Telephone Encounter (Signed)
Called patient and let her know that she is being scheduled today 10/25/19 with Ned Card NP at 1:45p patient verbalized understanding.

## 2019-10-25 NOTE — Progress Notes (Addendum)
  [facility] OFFICE PROGRESS NOTE   Diagnosis: Non-Hodgkin's lymphoma  INTERVAL HISTORY:   Katelyn Irwin returns prior to scheduled follow-up for evaluation of a lump at the right eyebrow.  She initially noted a fullness 4 to 5 days ago.  There has been no change.  No known injury.  No fevers or sweats.  She has a good appetite.  Objective:  Vital signs in last 24 hours:  Blood pressure 121/82, pulse 68, temperature 98.7 F (37.1 C), temperature source Temporal, resp. rate 17, height 5\' 6"  (1.676 m), weight 162 lb (73.5 kg), SpO2 100 %.    HEENT: 3 cm firm fullness along the right eyebrow extending superior. Lymphatics: No palpable cervical, supraclavicular, axillary or inguinal lymph nodes. GI: Abdomen soft and nontender.  No hepatosplenomegaly. Vascular: No leg edema.    Lab Results:  Lab Results  Component Value Date   WBC 4.8 01/20/2017   HGB 13.7 01/20/2017   HCT 40.4 01/20/2017   MCV 91.5 01/20/2017   PLT 202 01/20/2017   NEUTROABS 2.0 01/20/2017    Imaging:  No results found.  Medications: I have reviewed the patient's current medications.  Assessment/Plan: 1. High-grade B-cell non-Hodgkin's lymphoma presenting with pelvic pain found to have bulky pelvic lymphadenopathy January 2010. Status post 6 cycles of CHOP/Rituxan with a complete response. 2. 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 3. Chronic leukopenia. 4. Subcutaneous lesion right sternoclavicular notch-excisional biopsy 03/10/2014 confirmed a high-grade follicular lymphoma (grade 3A)  PET scan 03/22/2014. No clear evidence of active lymphoma. Extensive brown fat within the lower neck.   Restaging CT scans 06/23/2014-negative for lymphoma   Disposition: Katelyn Irwin appears stable.  She presents today for  evaluation of an area of prominence at the right eyebrow/forehead.  We are referring her for a maxillofacial CT scan.  We will schedule follow-up pending the CT result.  Patient seen with Dr. Benay Spice.  Ned Card ANP/GNP-BC   10/25/2019  2:13 PM  This was a shared visit with Ned Card.  Katelyn Irwin interviewed and examined.  She has a firm prominence just above the right eyebrow.  The etiology is unclear.  She will be referred for a diagnostic CT.  There is no other evidence for progression of the non-Hodgkin's follow.  Katelyn Manson, MD

## 2019-10-25 NOTE — Telephone Encounter (Signed)
Per Ned Card NP called and scheduled patient for CT on Friday 10/28/19 @ 2:30p. Patient is to arrive by 2:15p and to be NPO 4 hours before. Patient given written instructions of CT date, time, and instructions.

## 2019-10-28 ENCOUNTER — Ambulatory Visit (HOSPITAL_COMMUNITY)
Admission: RE | Admit: 2019-10-28 | Discharge: 2019-10-28 | Disposition: A | Discharge status: Home / Self Care | Payer: BC Managed Care – PPO | Source: Ambulatory Visit | Attending: Nurse Practitioner | Admitting: Nurse Practitioner

## 2019-10-28 ENCOUNTER — Telehealth: Payer: Self-pay | Admitting: Nurse Practitioner

## 2019-10-28 ENCOUNTER — Other Ambulatory Visit: Payer: Self-pay

## 2019-10-28 DIAGNOSIS — C8510 Unspecified B-cell lymphoma, unspecified site: Secondary | ICD-10-CM | POA: Insufficient documentation

## 2019-10-28 MED ORDER — SODIUM CHLORIDE (PF) 0.9 % IJ SOLN
INTRAMUSCULAR | Status: AC
  Filled 2019-10-28: qty 50

## 2019-10-28 MED ORDER — IOHEXOL 300 MG/ML  SOLN
75.0000 mL | Freq: Once | INTRAMUSCULAR | Status: AC | PRN
  Administered 2019-10-28: 14:00:00 75 mL via INTRAVENOUS

## 2019-10-28 NOTE — Telephone Encounter (Signed)
I contacted Katelyn Irwin with the MRI result-nonspecific soft tissue thickening right frontal region just above the superior orbital rim.  She again confirms no history of trauma.  We will schedule a follow-up visit in 1 month.  She will contact the office for a sooner visit if she notices any change.

## 2019-10-31 ENCOUNTER — Telehealth: Payer: Self-pay | Admitting: Oncology

## 2019-10-31 NOTE — Telephone Encounter (Signed)
Scheduled per 2/12 sch msg. Called and spoke with pt, confirmed 3/12 appt

## 2019-11-25 ENCOUNTER — Other Ambulatory Visit: Payer: Self-pay

## 2019-11-25 ENCOUNTER — Inpatient Hospital Stay: Payer: BC Managed Care – PPO | Attending: Nurse Practitioner | Admitting: Oncology

## 2019-11-25 ENCOUNTER — Telehealth: Payer: Self-pay | Admitting: Oncology

## 2019-11-25 VITALS — BP 109/78 | HR 64 | Temp 97.8°F | Resp 17 | Ht 66.0 in | Wt 162.0 lb

## 2019-11-25 DIAGNOSIS — C8282 Other types of follicular lymphoma, intrathoracic lymph nodes: Secondary | ICD-10-CM | POA: Insufficient documentation

## 2019-11-25 DIAGNOSIS — C8510 Unspecified B-cell lymphoma, unspecified site: Secondary | ICD-10-CM | POA: Diagnosis not present

## 2019-11-25 DIAGNOSIS — R202 Paresthesia of skin: Secondary | ICD-10-CM | POA: Insufficient documentation

## 2019-11-25 DIAGNOSIS — D72819 Decreased white blood cell count, unspecified: Secondary | ICD-10-CM | POA: Insufficient documentation

## 2019-11-25 DIAGNOSIS — Z79899 Other long term (current) drug therapy: Secondary | ICD-10-CM | POA: Diagnosis not present

## 2019-11-25 DIAGNOSIS — C8516 Unspecified B-cell lymphoma, intrapelvic lymph nodes: Secondary | ICD-10-CM | POA: Insufficient documentation

## 2019-11-25 NOTE — Telephone Encounter (Signed)
Scheduled per los. Gave avs and calendar  

## 2019-11-25 NOTE — Progress Notes (Signed)
  [facility] OFFICE PROGRESS NOTE    Diagnosis: Non-Hodgkin's lymphoma  INTERVAL HISTORY:   Katelyn Irwin returns for a scheduled visit.  The fullness above the right eye is unchanged.  She has occasional tingling in this area.  No pain.  No other complaint.  No palpable lymph nodes.  No fever or night sweats.  Objective:  Vital signs in last 24 hours:  Blood pressure 109/78, pulse 64, temperature 97.8 F (36.6 C), temperature source Temporal, resp. rate 17, height '5\' 6"'$  (1.676 m), weight 162 lb (73.5 kg), SpO2 100 %.    HEENT: 2-3 cm area of slightly raised firm fullness superior to the right orbit Lymphatics: No cervical, supraclavicular, axillary, or inguinal nodes  GI: No hepatosplenomegaly, no mass, nontender Vascular: No leg edema   Portacath/PICC-without erythema  Lab Results:  Lab Results  Component Value Date   WBC 4.8 01/20/2017   HGB 13.7 01/20/2017   HCT 40.4 01/20/2017   MCV 91.5 01/20/2017   PLT 202 01/20/2017   NEUTROABS 2.0 01/20/2017    CMP  Lab Results  Component Value Date   NA 142 01/20/2017   K 4.2 01/20/2017   CL 104 12/31/2012   CO2 28 01/20/2017   GLUCOSE 94 01/20/2017   BUN 17.9 01/20/2017   CREATININE 0.9 01/20/2017   CALCIUM 10.4 01/20/2017   PROT 7.3 01/20/2017   ALBUMIN 4.4 01/20/2017   AST 23 01/20/2017   ALT 32 01/20/2017   ALKPHOS 30 (L) 01/20/2017   BILITOT 1.46 (H) 01/20/2017   GFRNONAA >60 10/12/2007   GFRAA  10/12/2007    >60        The eGFR has been calculated using the MDRD equation. This calculation has not been validated in all clinical     Medications: I have reviewed the patient's current medications.   Assessment/Plan:  1. High-grade B-cell non-Hodgkin's lymphoma presenting with pelvic pain found to have bulky pelvic lymphadenopathy January 2010. Status post 6 cycles of CHOP/Rituxan with a complete response. 2. 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 3. Chronic leukopenia. 4. Subcutaneous lesion right sternoclavicular notch-excisional biopsy 03/10/2014 confirmed a high-grade follicular lymphoma (grade 3A)  PET scan 03/22/2014. No clear evidence of active lymphoma. Extensive brown fat within the lower neck.   Restaging CT scans 06/23/2014-negative for lymphoma 5.  Firm fullness superior to the right orbit February 2021  CT maxillofacial 10/28/2019-subperiosteal soft tissue thickening in the right frontal region, just above the superior orbital rim measuring    Disposition: Katelyn Irwin appears unchanged.  The soft tissue fullness at the right orbit is unchanged.  We reviewed the CT images and discussed treatment options.  We discussed a biopsy of the soft tissue mass.  Katelyn Irwin is comfortable with observation for now.  She will call us if the lesion changes.  She will return for an office visit in 3 months.  [name], MD  11/25/2019  2:22 PM

## 2019-12-26 ENCOUNTER — Telehealth: Payer: Self-pay | Admitting: *Deleted

## 2019-12-26 NOTE — Telephone Encounter (Signed)
Called to request MD proceed with referral to have the soft tissue mass over her eye biopsied.

## 2019-12-28 ENCOUNTER — Encounter: Payer: Self-pay | Admitting: *Deleted

## 2019-12-28 ENCOUNTER — Other Ambulatory Visit: Payer: Self-pay | Admitting: Nurse Practitioner

## 2019-12-28 DIAGNOSIS — C8510 Unspecified B-cell lymphoma, unspecified site: Secondary | ICD-10-CM

## 2020-01-03 ENCOUNTER — Encounter: Payer: Self-pay | Admitting: Oncology

## 2020-01-03 ENCOUNTER — Encounter: Payer: Self-pay | Admitting: *Deleted

## 2020-01-17 ENCOUNTER — Other Ambulatory Visit: Payer: Self-pay | Admitting: Otolaryngology

## 2020-01-31 ENCOUNTER — Other Ambulatory Visit (HOSPITAL_COMMUNITY)
Admission: RE | Admit: 2020-01-31 | Discharge: 2020-01-31 | Disposition: A | Discharge status: Home / Self Care | Payer: BC Managed Care – PPO | Source: Ambulatory Visit | Attending: Otolaryngology | Admitting: Otolaryngology

## 2020-01-31 DIAGNOSIS — Z20822 Contact with and (suspected) exposure to covid-19: Secondary | ICD-10-CM | POA: Diagnosis not present

## 2020-01-31 DIAGNOSIS — Z01812 Encounter for preprocedural laboratory examination: Secondary | ICD-10-CM | POA: Diagnosis not present

## 2020-01-31 LAB — SARS CORONAVIRUS 2 (TAT 6-24 HRS): SARS Coronavirus 2: NEGATIVE

## 2020-02-02 ENCOUNTER — Encounter (HOSPITAL_COMMUNITY): Payer: Self-pay | Admitting: Otolaryngology

## 2020-02-02 ENCOUNTER — Other Ambulatory Visit: Payer: Self-pay

## 2020-02-02 NOTE — Progress Notes (Addendum)
Mrs. Katelyn Irwin Patient tested negative for Covid_5/18/2021_ and has been in quarantine since that time. Mrs Katelyn Irwin denies chest pain or shortness pain.  This is the third time  Mrs. Katelyn Irwin has been treated for Lymphoma. Patient had an ECHO prior to chem in 2021, I saw a note to repeat ECHO after chemo was completed, patient did not know anything about this.

## 2020-02-02 NOTE — Anesthesia Preprocedure Evaluation (Addendum)
Anesthesia Evaluation  Patient identified by MRN, date of birth, ID band Patient awake    Reviewed: Allergy & Precautions, NPO status , Patient's Chart, lab work & pertinent test results  Airway Mallampati: II  TM Distance: >3 FB Neck ROM: Full    Dental  (+) Dental Advisory Given   Pulmonary neg pulmonary ROS,    breath sounds clear to auscultation       Cardiovascular negative cardio ROS   Rhythm:Regular Rate:Normal     Neuro/Psych negative neurological ROS     GI/Hepatic negative GI ROS, Neg liver ROS,   Endo/Other  diabetes  Renal/GU negative Renal ROS     Musculoskeletal  (+) Arthritis ,   Abdominal   Peds  Hematology Hx b cell lymphoma   Anesthesia Other Findings   Reproductive/Obstetrics                            Anesthesia Physical Anesthesia Plan  ASA: II  Anesthesia Plan: General   Post-op Pain Management:    Induction: Intravenous  PONV Risk Score and Plan: 3 and Treatment may vary due to age or medical condition, Dexamethasone, Ondansetron and Midazolam  Airway Management Planned: LMA and Oral ETT  Additional Equipment: None  Intra-op Plan:   Post-operative Plan: Extubation in OR  Informed Consent: I have reviewed the patients History and Physical, chart, labs and discussed the procedure including the risks, benefits and alternatives for the proposed anesthesia with the patient or authorized representative who has indicated his/her understanding and acceptance.     Dental advisory given  Plan Discussed with: CRNA  Anesthesia Plan Comments:        Anesthesia Quick Evaluation

## 2020-02-03 ENCOUNTER — Other Ambulatory Visit: Payer: Self-pay

## 2020-02-03 ENCOUNTER — Encounter (HOSPITAL_COMMUNITY): Payer: Self-pay | Admitting: Otolaryngology

## 2020-02-03 ENCOUNTER — Ambulatory Visit (HOSPITAL_COMMUNITY): Payer: BC Managed Care – PPO | Admitting: Certified Registered Nurse Anesthetist

## 2020-02-03 ENCOUNTER — Ambulatory Visit (HOSPITAL_COMMUNITY)
Admission: RE | Admit: 2020-02-03 | Discharge: 2020-02-03 | Disposition: A | Discharge status: Home / Self Care | Payer: BC Managed Care – PPO | Attending: Otolaryngology | Admitting: Otolaryngology

## 2020-02-03 ENCOUNTER — Encounter (HOSPITAL_COMMUNITY)
Admission: RE | Disposition: A | Discharge status: Home / Self Care | Payer: Self-pay | Source: Home / Self Care | Attending: Otolaryngology

## 2020-02-03 DIAGNOSIS — C8591 Non-Hodgkin lymphoma, unspecified, lymph nodes of head, face, and neck: Secondary | ICD-10-CM | POA: Insufficient documentation

## 2020-02-03 DIAGNOSIS — E119 Type 2 diabetes mellitus without complications: Secondary | ICD-10-CM | POA: Insufficient documentation

## 2020-02-03 DIAGNOSIS — L988 Other specified disorders of the skin and subcutaneous tissue: Secondary | ICD-10-CM | POA: Diagnosis present

## 2020-02-03 HISTORY — DX: Unspecified osteoarthritis, unspecified site: M19.90

## 2020-02-03 HISTORY — DX: Personal history of urinary calculi: Z87.442

## 2020-02-03 HISTORY — PX: LESION REMOVAL: SHX5196

## 2020-02-03 LAB — BASIC METABOLIC PANEL
Anion gap: 7 (ref 5–15)
BUN: 17 mg/dL (ref 6–20)
CO2: 29 mmol/L (ref 22–32)
Calcium: 9.7 mg/dL (ref 8.9–10.3)
Chloride: 106 mmol/L (ref 98–111)
Creatinine, Ser: 0.93 mg/dL (ref 0.44–1.00)
GFR calc Af Amer: >60 mL/min (ref >60)
GFR calc non Af Amer: >60 mL/min (ref >60)
Glucose, Bld: 125 mg/dL — ABNORMAL HIGH (ref 70–99)
Potassium: 3.9 mmol/L (ref 3.5–5.1)
Sodium: 142 mmol/L (ref 135–145)

## 2020-02-03 LAB — HEMOGLOBIN: Hemoglobin: 12.9 g/dL (ref 12.0–15.0)

## 2020-02-03 SURGERY — WIDE EXCISION, LESION, UPPER EXTREMITY
Anesthesia: General | Site: Face | Laterality: Right

## 2020-02-03 MED ORDER — PROPOFOL 10 MG/ML IV BOLUS
INTRAVENOUS | Status: DC | PRN
  Administered 2020-02-03: 200 mg via INTRAVENOUS

## 2020-02-03 MED ORDER — PHENYLEPHRINE HCL-NACL 10-0.9 MG/250ML-% IV SOLN
INTRAVENOUS | Status: DC | PRN
  Administered 2020-02-03: 60 ug/min via INTRAVENOUS

## 2020-02-03 MED ORDER — ONDANSETRON HCL 4 MG/2ML IJ SOLN
INTRAMUSCULAR | Status: AC
  Filled 2020-02-03: qty 2

## 2020-02-03 MED ORDER — FENTANYL CITRATE (PF) 100 MCG/2ML IJ SOLN
INTRAMUSCULAR | Status: DC | PRN
  Administered 2020-02-03 (×2): 25 ug via INTRAVENOUS

## 2020-02-03 MED ORDER — ONDANSETRON HCL 4 MG/2ML IJ SOLN
INTRAMUSCULAR | Status: DC | PRN
  Administered 2020-02-03: 4 mg via INTRAVENOUS

## 2020-02-03 MED ORDER — CHLORHEXIDINE GLUCONATE 0.12 % MT SOLN
15.0000 mL | Freq: Once | OROMUCOSAL | Status: AC
  Administered 2020-02-03: 15 mL via OROMUCOSAL
  Filled 2020-02-03: qty 15

## 2020-02-03 MED ORDER — PROMETHAZINE HCL 25 MG/ML IJ SOLN
6.2500 mg | INTRAMUSCULAR | Status: DC | PRN
  Administered 2020-02-03: 6.25 mg via INTRAVENOUS

## 2020-02-03 MED ORDER — PROPOFOL 10 MG/ML IV BOLUS
INTRAVENOUS | Status: AC
  Filled 2020-02-03: qty 20

## 2020-02-03 MED ORDER — 0.9 % SODIUM CHLORIDE (POUR BTL) OPTIME
TOPICAL | Status: DC | PRN
  Administered 2020-02-03: 1000 mL

## 2020-02-03 MED ORDER — PROMETHAZINE HCL 25 MG/ML IJ SOLN
INTRAMUSCULAR | Status: AC
  Filled 2020-02-03: qty 1

## 2020-02-03 MED ORDER — LACTATED RINGERS IV SOLN: INTRAVENOUS | Status: DC | PRN

## 2020-02-03 MED ORDER — DEXAMETHASONE SODIUM PHOSPHATE 10 MG/ML IJ SOLN
INTRAMUSCULAR | Status: DC | PRN
  Administered 2020-02-03: 4 mg via INTRAVENOUS

## 2020-02-03 MED ORDER — MIDAZOLAM HCL 5 MG/5ML IJ SOLN
INTRAMUSCULAR | Status: DC | PRN
  Administered 2020-02-03: 1 mg via INTRAVENOUS

## 2020-02-03 MED ORDER — ACETAMINOPHEN 500 MG PO TABS
1000.0000 mg | ORAL_TABLET | Freq: Once | ORAL | Status: AC
  Administered 2020-02-03: 1000 mg via ORAL
  Filled 2020-02-03: qty 2

## 2020-02-03 MED ORDER — LIDOCAINE 2% (20 MG/ML) 5 ML SYRINGE
INTRAMUSCULAR | Status: DC | PRN
  Administered 2020-02-03: 60 mg via INTRAVENOUS

## 2020-02-03 MED ORDER — LIDOCAINE-EPINEPHRINE 1 %-1:100000 IJ SOLN
INTRAMUSCULAR | Status: AC
  Filled 2020-02-03: qty 1

## 2020-02-03 MED ORDER — MIDAZOLAM HCL 2 MG/2ML IJ SOLN
INTRAMUSCULAR | Status: AC
  Filled 2020-02-03: qty 2

## 2020-02-03 MED ORDER — FENTANYL CITRATE (PF) 100 MCG/2ML IJ SOLN: 25.0000 ug | INTRAMUSCULAR | Status: DC | PRN

## 2020-02-03 MED ORDER — LIDOCAINE 2% (20 MG/ML) 5 ML SYRINGE
INTRAMUSCULAR | Status: AC
  Filled 2020-02-03: qty 5

## 2020-02-03 MED ORDER — ORAL CARE MOUTH RINSE: 15.0000 mL | Freq: Once | OROMUCOSAL | Status: AC

## 2020-02-03 MED ORDER — CEFAZOLIN SODIUM-DEXTROSE 2-4 GM/100ML-% IV SOLN
INTRAVENOUS | Status: AC
  Filled 2020-02-03: qty 100

## 2020-02-03 MED ORDER — PHENYLEPHRINE 40 MCG/ML (10ML) SYRINGE FOR IV PUSH (FOR BLOOD PRESSURE SUPPORT)
PREFILLED_SYRINGE | INTRAVENOUS | Status: DC | PRN
  Administered 2020-02-03: 80 ug via INTRAVENOUS

## 2020-02-03 MED ORDER — CELECOXIB 200 MG PO CAPS
200.0000 mg | ORAL_CAPSULE | Freq: Once | ORAL | Status: AC
  Administered 2020-02-03: 200 mg via ORAL
  Filled 2020-02-03: qty 1

## 2020-02-03 MED ORDER — BACITRACIN ZINC 500 UNIT/GM EX OINT
TOPICAL_OINTMENT | CUTANEOUS | Status: DC | PRN
  Administered 2020-02-03: 1 via TOPICAL

## 2020-02-03 MED ORDER — DEXAMETHASONE SODIUM PHOSPHATE 10 MG/ML IJ SOLN
INTRAMUSCULAR | Status: AC
  Filled 2020-02-03: qty 1

## 2020-02-03 MED ORDER — CEFAZOLIN SODIUM-DEXTROSE 2-3 GM-%(50ML) IV SOLR
INTRAVENOUS | Status: DC | PRN
  Administered 2020-02-03: 2 g via INTRAVENOUS

## 2020-02-03 MED ORDER — LIDOCAINE-EPINEPHRINE 1 %-1:100000 IJ SOLN
INTRAMUSCULAR | Status: DC | PRN
  Administered 2020-02-03: 2.5 mL

## 2020-02-03 MED ORDER — BACITRACIN ZINC 500 UNIT/GM EX OINT
TOPICAL_OINTMENT | CUTANEOUS | Status: AC
  Filled 2020-02-03: qty 28.35

## 2020-02-03 MED ORDER — FENTANYL CITRATE (PF) 250 MCG/5ML IJ SOLN
INTRAMUSCULAR | Status: AC
  Filled 2020-02-03: qty 5

## 2020-02-03 SURGICAL SUPPLY — 49 items
ATTRACTOMAT 16X20 MAGNETIC DRP (DRAPES) ×2 IMPLANT
BLADE SURG 15 STRL LF DISP TIS (BLADE) IMPLANT
BLADE SURG 15 STRL SS (BLADE)
BNDG GAUZE ELAST 4 BULKY (GAUZE/BANDAGES/DRESSINGS) IMPLANT
CANISTER SUCT 3000ML PPV (MISCELLANEOUS) ×2 IMPLANT
CLEANER TIP ELECTROSURG 2X2 (MISCELLANEOUS) ×2 IMPLANT
CNTNR URN SCR LID CUP LEK RST (MISCELLANEOUS) ×1 IMPLANT
CONT SPEC 4OZ STRL OR WHT (MISCELLANEOUS) ×2
CORD BIPOLAR FORCEPS 12FT (ELECTRODE) ×2 IMPLANT
COVER SURGICAL LIGHT HANDLE (MISCELLANEOUS) ×2 IMPLANT
COVER WAND RF STERILE (DRAPES) IMPLANT
DRAIN HEMOVAC 7FR (DRAIN) IMPLANT
DRAIN PENROSE 1/4X12 LTX STRL (WOUND CARE) IMPLANT
DRAPE HALF SHEET 40X57 (DRAPES) IMPLANT
ELECT COATED BLADE 2.86 ST (ELECTRODE) ×4 IMPLANT
ELECT PENCIL ROCKER SW 15FT (MISCELLANEOUS) ×2 IMPLANT
ELECT REM PT RETURN 9FT ADLT (ELECTROSURGICAL) ×2
ELECT REM PT RETURN 9FT PED (ELECTROSURGICAL)
ELECTRODE REM PT RETRN 9FT PED (ELECTROSURGICAL) IMPLANT
ELECTRODE REM PT RTRN 9FT ADLT (ELECTROSURGICAL) ×1 IMPLANT
EVACUATOR SILICONE 100CC (DRAIN) IMPLANT
FORCEPS BIPOLAR SPETZLER 8 1.0 (NEUROSURGERY SUPPLIES) ×2 IMPLANT
GAUZE SPONGE 4X4 12PLY STRL (GAUZE/BANDAGES/DRESSINGS) IMPLANT
GLOVE BIO SURGEON STRL SZ 6.5 (GLOVE) ×2 IMPLANT
GOWN STRL REUS W/ TWL LRG LVL3 (GOWN DISPOSABLE) ×3 IMPLANT
GOWN STRL REUS W/TWL LRG LVL3 (GOWN DISPOSABLE) ×6
KIT BASIN OR (CUSTOM PROCEDURE TRAY) ×2 IMPLANT
KIT TURNOVER KIT B (KITS) ×2 IMPLANT
LOCATOR NERVE 3 VOLT (DISPOSABLE) IMPLANT
NEEDLE HYPO 25GX1X1/2 BEV (NEEDLE) ×2 IMPLANT
NS IRRIG 1000ML POUR BTL (IV SOLUTION) ×2 IMPLANT
PAD ARMBOARD 7.5X6 YLW CONV (MISCELLANEOUS) ×4 IMPLANT
PENCIL BUTTON HOLSTER BLD 10FT (ELECTRODE) ×2 IMPLANT
STAPLER VISISTAT 35W (STAPLE) ×2 IMPLANT
SUT ETHILON 3 0 PS 1 (SUTURE) IMPLANT
SUT ETHILON 5 0 PS 2 18 (SUTURE) IMPLANT
SUT PLAIN GUT FAST 5-0 (SUTURE) ×2 IMPLANT
SUT SILK 2 0 PERMA HAND 18 BK (SUTURE) IMPLANT
SUT SILK 3 0 REEL (SUTURE) IMPLANT
SUT VIC AB 3-0 PS2 18 (SUTURE)
SUT VIC AB 3-0 PS2 18XBRD (SUTURE) IMPLANT
SUT VIC AB 4-0 P-3 18X BRD (SUTURE) IMPLANT
SUT VIC AB 4-0 P-3 18XBRD (SUTURE) ×1 IMPLANT
SUT VIC AB 4-0 P3 18 (SUTURE) ×2
SWAB COLLECTION DEVICE MRSA (MISCELLANEOUS) IMPLANT
SWAB CULTURE ESWAB REG 1ML (MISCELLANEOUS) IMPLANT
TOWEL GREEN STERILE (TOWEL DISPOSABLE) ×2 IMPLANT
TRAY ENT MC OR (CUSTOM PROCEDURE TRAY) ×2 IMPLANT
WATER STERILE IRR 1000ML POUR (IV SOLUTION) ×2 IMPLANT

## 2020-02-03 NOTE — Anesthesia Postprocedure Evaluation (Signed)
Anesthesia Post Note  Patient: Katelyn Irwin  Procedure(s) Performed: EXCISION OF RIGHT FOREHEAD LESION (Right Face)     Patient location during evaluation: PACU Anesthesia Type: General Level of consciousness: awake and alert Pain management: pain level controlled Vital Signs Assessment: post-procedure vital signs reviewed and stable Respiratory status: spontaneous breathing, nonlabored ventilation, respiratory function stable and patient connected to nasal cannula oxygen Cardiovascular status: blood pressure returned to baseline and stable Postop Assessment: no apparent nausea or vomiting Anesthetic complications: no    Last Vitals:  Vitals:   02/03/20 0935 02/03/20 0950  BP: 104/65 109/72  Pulse: 60 60  Resp: 13 15  Temp:  36.7 C  SpO2: 97% 96%    Last Pain:  Vitals:   02/03/20 0950  TempSrc:   PainSc: 2                  Tiajuana Amass

## 2020-02-03 NOTE — Anesthesia Procedure Notes (Signed)
Procedure Name: LMA Insertion Performed by: Makailee Nudelman H, CRNA Pre-anesthesia Checklist: Patient identified, Emergency Drugs available, Suction available and Patient being monitored Patient Re-evaluated:Patient Re-evaluated prior to induction Oxygen Delivery Method: Circle System Utilized Preoxygenation: Pre-oxygenation with 100% oxygen Induction Type: IV induction Ventilation: Mask ventilation without difficulty LMA: LMA with gastric port inserted LMA Size: 4.0 Number of attempts: 1 Airway Equipment and Method: Bite block Placement Confirmation: positive ETCO2 Tube secured with: Tape Dental Injury: Teeth and Oropharynx as per pre-operative assessment        

## 2020-02-03 NOTE — H&P (Signed)
The surgical history remains accurate and without interval change. The condition still exists which makes the procedure necessary. The patient and/or family is aware of their condition and has been informed of the risks and benefits of surgery, as well as alternatives. All parties have elected to proceed with surgery.   Surgical plan:  Excision of right forehead lesion. Will send for lymphoma protocol pathologic evaluation.   Otolaryngology New Patient Note  Subjective: [name] is a 54 y.o. female is seen in consultation at the request of Wolfgang Phoenix, Mabeline Caras* for evaluation of knot above her right eye. This was first noticed in January, staying the same size. Has not grown. No pain. No prior head injuries, no smell changes, no sinus infections or previous head trauma. Patient with h/o non-Hodgkin's lymphoma (2010,2012,2015), not currently on any maintenance therapy. She denies fevers, weight loss, night sweats, chills. She has no other neck masses. She has no systemic symptoms.  History reviewed. No pertinent past medical history. History reviewed. No pertinent surgical history. History reviewed. No pertinent family history. Social History   Socioeconomic History  . Marital status: Married  Spouse name: Not on file  . Number of children: Not on file  . Years of education: Not on file  . Highest education level: Not on file  Occupational History  . Not on file  Tobacco Use  . Smoking status: Never Smoker  . Smokeless tobacco: Never Used  Substance and Sexual Activity  . Alcohol use: Never  . Drug use: Never  . Sexual activity: Not on file  Other Topics Concern  . Not on file  Social History Narrative  . Not on file   Social Determinants of Health   Financial Resource Strain:  . Difficulty of Paying Living Expenses:  Food Insecurity:  . Worried About Charity fundraiser in the Last Year:  . Arboriculturist in the Last Year:  Transportation Needs:  . Lexicographer (Medical):  Marland Kitchen Lack of Transportation (Non-Medical):  Physical Activity:  . Days of Exercise per Week:  . Minutes of Exercise per Session:  Stress:  . Feeling of Stress :  Social Connections:  . Frequency of Communication with Friends and Family:  . Frequency of Social Gatherings with Friends and Family:  . Attends Religious Services:  . Active Member of Clubs or Organizations:  . Attends Archivist Meetings:  Marland Kitchen Marital Status:   No Known Allergies Updated Medication List:   docosahexaenoic acid-epa (FISH OIL) 120-180 mg Cap  Sig - Route: Take 1 capsule by mouth daily. - Oral  Class: Historical Med  multivit w/iron-folic acid (CERTAVITE-ANTIOXIDANT) 18-400 mg-mcg Tab tablet  Sig - Route: Take 1 tablet by mouth daily. - Oral  Class: Historical Med    ROS A complete review of systems was conducted and was negative except as stated in the HPI.   Objective: Vitals:  01/13/20 1028  BP: 105/69  Pulse: 54  Temp: 97.1 F (36.2 C)  Resp: 14  SpO2: 97%  Height: 1.676 m (5\' 6" )  Weight: 72.5 kg (159 lb 12.8 oz)  BMI (Calculated): 25.8   Physical Exam:  General Normocephalic, Awake, Alert and appropriate for the exam  Eyes PERRL, no scleral icterus or conjunctival hemorrhage.  EOMI. At the lateral superior orbital rim extending onto the forehead there is a very firm and movable masslike structure which is pretty discrete. No fluctuance. This is pretty lateral and unlikely to be related to the frontal  sinus  Ears Right ear- EAC patent, no obstructing cerumen. TM: intact, no effusion, no retraction, normal landmarks Left ear- EAC patent, no obstructing cerumen. TM: intact, no effusion, no retraction, normal landmarks  Nose Patent, No polyps or masses seen.  Oral Pharynx No mucosal lesions or tumors seen. Dentition is grossly normal.  Mirror laryngeal examination reveals crisp epiglottis, mobile arytenoids and normal cords  Lymphatics No cervical  lymphadenopathy or masses on palpation  Endocrine No thyroidmegaly, no thyroid masses palpated  Cardio-vascular No cyanosis, regular rate  Pulmonary No audible stridor, Breathing easily with no labor. No dysphonia.  Neuro Symmetric facial movement.  Tongue protrudes in midline.  Psychiatry Appropriate affect and mood for clinic visit.  Skin No scars or lesions on face or neck.    CT maxillofacial February 2021 personally reviewed. My interpretation of the studies that there is a very well demarcated mass, benign in appearance, non-Hodgkin's lymphoma along the right forehead beginning at the superior orbital rim. Does not appear to involve any bone. Appears to be soft tissue, no fluid.  FNA Procedure Note:  Location: Right supraorbital region. Anesthesia: 1% lidocaine with 1:100,000 epinephrine Details: The mass was grasped and using consistent negative force with the syringe, multiple passes were obtained using a needle. These were expressed onto slides and placed in respective appropriate mediums for preservation. Next, a bandaid was placed. The patient tolerated the procedure well. There were no immediate complications.   Assessment:  My impression is that Katelyn Irwin has  1. Right facial swelling  2. Right orbit trauma, initial encounter  3. Non-Hodgkin's lymphoma, unspecified body region, unspecified non-Hodgkin lymphoma type ([place])  .     Helayne Seminole, MD

## 2020-02-03 NOTE — Discharge Instructions (Addendum)
-  Cleanse wound with 1/2 strength hydrogen peroxide on a Q tip twice daily. Pat dry. Apply bacitracin ointment along incision.  Do this twice daily for 7 to 10 days. -You may shower like you normally would. Do not scrub over incision. Pat dry.  -No strenuous exercise or heavy lifting until 14 days after surgery. Walk/ambulate at home often. Perform calf and stretch exercises often to prevent blood clot formation.  -You may alternate Tylenol and/or ibuprofen as needed for pain.  Using ice packs for 15-minute intervals will also be helpful. -Your sutures are absorbable and will not need to be removed.  Be sure to have follow-up with your oncologist scheduled for approximately 7- 14 days from now.  Call Dr. Trish Mage office with any wound concerns.

## 2020-02-03 NOTE — Transfer of Care (Signed)
Immediate Anesthesia Transfer of Care Note  Patient: Katelyn Irwin  Procedure(s) Performed: EXCISION OF RIGHT FOREHEAD LESION (Right Face)  Patient Location: PACU  Anesthesia Type:General  Level of Consciousness: drowsy  Airway & Oxygen Therapy: Patient Spontanous Breathing  Post-op Assessment: Report given to RN and Post -op Vital signs reviewed and stable  Post vital signs: Reviewed and stable  Last Vitals:  Vitals Value Taken Time  BP 143/80 02/03/20 0905  Temp 36.6 C 02/03/20 0905  Pulse 63 02/03/20 0905  Resp 16 02/03/20 0905  SpO2 97 % 02/03/20 0905  Vitals shown include unvalidated device data.  Last Pain:  Vitals:   02/03/20 0627  TempSrc:   PainSc: 0-No pain         Complications: No apparent anesthesia complications

## 2020-02-03 NOTE — Op Note (Addendum)
DATE OF PROCEDURE: 02/03/2020   PRE-OPERATIVE DIAGNOSIS: FOREHEAD LESION   POST-OPERATIVE DIAGNOSIS: Same   PROCEDURE(S): Excision right forehead lesion   SURGEON:  Gavin Pound, MD    ASSISTANT(S): none    ANESTHESIA: General LMA anesthesia     ESTIMATED BLOOD LOSS: 10 mL  SPECIMENS: Right forehead lesion, sent for lymphoma protocol   COMPLICATIONS: None    OPERATIVE FINDINGS: This patient has had a soft tissue lesion adjacent to the superior right lateral third of the brow that has been present since February 2021.  She has a significant history for lymphoma which has occurred since similar soft tissue regions and 3 prior lymph node/soft tissue masses.  FNA in clinic demonstrated atypical lymphocyte proliferation.  The lesion today was soft, about 1.5 cm and adjacent to the galea aponeurosis on the lateral third of the right brow, pretty well-circumscribed.   OPERATIVE DETAILS: The patient was taken the operating room placed the supine position.  General anesthesia was induced.  A timeout was performed.  The patient's face was prepped with Betadine and draped in the usual sterile fashion.  An incision was designed and injected subcutaneously with 1% lidocaine with 1 100,000 epinephrine.  Incision was made with 15 blade.  Soft tissue dissection through the subcutaneous fat and musculature including some superior regions of the orbicularis oculi were encountered.  These were retracted and the soft tissue mass was identified.  The mass was then grasped. This mass was friable and nondiscrete. A freer was used to elevate the mass off bone.  Medially, the mass was somewhat adherent to the supraorbital nerve bundle, but this was able to be freed with the nerve intact. Care was taken to avoid any peripheral small branches of the frontal branch of the facial nerve.  This is passed off the field and sent for lymphoma protocol.  Hemostasis was achieved with bipolar  electrocautery.  The wound bed was copiously irrigated.  Dermal layer was closed with buried interrupted undyed 4-0 Vicryl sutures.  Skin was closed with 5-0 fast-absorbing gut in a running locking fashion with good cosmetic closure.  Skin was cleansed.  Bacitracin was applied.  All instrumentation was removed.  The patient tolerated the procedure well and there were no complications.   Helayne Seminole, MD

## 2020-02-07 LAB — SURGICAL PATHOLOGY

## 2020-02-08 ENCOUNTER — Encounter: Payer: Self-pay | Admitting: Oncology

## 2020-02-09 ENCOUNTER — Telehealth: Payer: Self-pay | Admitting: *Deleted

## 2020-02-09 DIAGNOSIS — C8510 Unspecified B-cell lymphoma, unspecified site: Secondary | ICD-10-CM

## 2020-02-09 NOTE — Telephone Encounter (Signed)
Called patient to report MD has ordered a CT neck/chest/abd/pelvis. Confirmed she has no contrast allergy and prefers to drink the water based contrast and is aware she will need to arrive early. Confirmed she still has [employer].

## 2020-02-21 ENCOUNTER — Ambulatory Visit (HOSPITAL_COMMUNITY): Payer: BC Managed Care – PPO

## 2020-02-21 ENCOUNTER — Other Ambulatory Visit: Payer: Self-pay

## 2020-02-21 ENCOUNTER — Ambulatory Visit (HOSPITAL_COMMUNITY)
Admission: RE | Admit: 2020-02-21 | Discharge: 2020-02-21 | Disposition: A | Discharge status: Home / Self Care | Payer: BC Managed Care – PPO | Source: Ambulatory Visit | Attending: Oncology | Admitting: Oncology

## 2020-02-21 DIAGNOSIS — C8510 Unspecified B-cell lymphoma, unspecified site: Secondary | ICD-10-CM | POA: Insufficient documentation

## 2020-02-21 MED ORDER — IOHEXOL 9 MG/ML PO SOLN
ORAL | Status: AC
  Filled 2020-02-21: qty 1000

## 2020-02-21 MED ORDER — IOHEXOL 300 MG/ML  SOLN
100.0000 mL | Freq: Once | INTRAMUSCULAR | Status: AC | PRN
  Administered 2020-02-21: 100 mL via INTRAVENOUS

## 2020-02-21 MED ORDER — SODIUM CHLORIDE (PF) 0.9 % IJ SOLN
INTRAMUSCULAR | Status: AC
  Filled 2020-02-21: qty 50

## 2020-02-24 ENCOUNTER — Encounter: Payer: Self-pay | Admitting: Nurse Practitioner

## 2020-02-24 ENCOUNTER — Other Ambulatory Visit: Payer: Self-pay

## 2020-02-24 ENCOUNTER — Inpatient Hospital Stay: Payer: BC Managed Care – PPO | Attending: Nurse Practitioner | Admitting: Nurse Practitioner

## 2020-02-24 VITALS — BP 115/75 | HR 62 | Temp 97.7°F | Resp 17 | Ht 66.0 in | Wt 154.8 lb

## 2020-02-24 DIAGNOSIS — C8282 Other types of follicular lymphoma, intrathoracic lymph nodes: Secondary | ICD-10-CM | POA: Insufficient documentation

## 2020-02-24 DIAGNOSIS — C8510 Unspecified B-cell lymphoma, unspecified site: Secondary | ICD-10-CM

## 2020-02-24 DIAGNOSIS — Z923 Personal history of irradiation: Secondary | ICD-10-CM | POA: Insufficient documentation

## 2020-02-24 DIAGNOSIS — D72819 Decreased white blood cell count, unspecified: Secondary | ICD-10-CM | POA: Insufficient documentation

## 2020-02-24 DIAGNOSIS — Z79899 Other long term (current) drug therapy: Secondary | ICD-10-CM | POA: Insufficient documentation

## 2020-02-24 DIAGNOSIS — C8516 Unspecified B-cell lymphoma, intrapelvic lymph nodes: Secondary | ICD-10-CM | POA: Diagnosis not present

## 2020-02-24 NOTE — Progress Notes (Addendum)
[facility] OFFICE PROGRESS NOTE   Diagnosis: Non-Hodgkin's lymphoma  INTERVAL HISTORY:   Katelyn Irwin returns as scheduled.  She feels well.  No fevers or sweats.  She has a good appetite.  She reports intentional weight loss by adjusting her diet.  Objective:  Vital signs in last 24 hours:  Blood pressure 115/75, pulse 62, temperature 97.7 F (36.5 C), temperature source Temporal, resp. rate 17, height 5\' 6"  (1.676 m), weight 154 lb 12.8 oz (70.2 kg), SpO2 100 %.    HEENT: Neck without mass.  Scar superior to the right eyebrow, trace edema. Lymphatics: No palpable cervical, supraclavicular, axillary or inguinal lymph nodes. GI: Abdomen soft and nontender.  No hepatosplenomegaly. Vascular: No leg edema.   Lab Results:  Lab Results  Component Value Date   WBC 4.8 01/20/2017   HGB 12.9 02/03/2020   HCT 40.4 01/20/2017   MCV 91.5 01/20/2017   PLT 202 01/20/2017   NEUTROABS 2.0 01/20/2017    Imaging:  No results found.  Medications: I have reviewed the patient's current medications.  Assessment/Plan: 1. High-grade B-cell non-Hodgkin's lymphoma presenting with pelvic pain found to have bulky pelvic lymphadenopathy January 2010. Status post 6 cycles of CHOP/Rituxan with a complete response. 2. 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 3. Chronic leukopenia. 4. Subcutaneous lesion right sternoclavicular notch-excisional biopsy 03/10/2014 confirmed a high-grade follicular lymphoma (grade 3A)  PET scan 03/22/2014. No clear evidence of active lymphoma. Extensive brown fat within the lower neck.   Restaging CT scans 06/23/2014-negative for lymphoma 5.  Firm fullness superior to the right orbit February 2021  CT maxillofacial 10/28/2019-subperiosteal soft tissue thickening in the right  frontal region, just above the superior orbital rim measuring  Excision right forehead lesion 02/03/2020-atypical lymphoid infiltrate consistent with non-Hodgkins B-cell lymphoma, low-grade process favored, unclear whether this represents recurrence of follicular lymphoma or a new disease process, possibly marginal zone lymphoma  CTs neck/chest/abdomen/pelvis 02/21/2020-no lymphadenopathy or other findings of recurrent lymphoma in the chest, abdomen or pelvis; no evidence of disease in the neck  Disposition: Katelyn Irwin has recurrent low-grade non-Hodgkin's lymphoma involving a right forehead lesion.  The lesion was partially excised 02/03/2020.  Dr. Benay Spice recommends consideration of radiation.  We made a referral to Dr. Tammi Klippel.  Recent CT scans show no evidence of recurrent lymphoma elsewhere.  Systemic therapy is not recommended at this time.  She will return for follow-up in 3 months.  She will contact the office in the interim with any problems.  Patient seen with Dr. Benay Spice.    Ned Card ANP/GNP-BC   02/24/2020  10:41 AM This was a shared visit with Ned Card.  Katelyn Irwin was interviewed and examined.  We discussed the pathology and CT findings with Katelyn Irwin and her husband.  The excisional biopsy of the right frontal mass is consistent with recurrence of low-grade lymphoma.  There is no other clinical or CT evidence of recurrent disease.  We discussed treatment options including observation, referral for radiation to the right frontal scalp/skull, and systemic therapy.  We will refer her to Dr. Tammi Klippel to consider adjuvant radiation.  This will spare her the potential toxicity of systemic therapy.  It is unlikely systemic therapy would be curative.  We can consider a course of rituximab with the hope of prolonging disease-free survival if Dr. Tammi Klippel feels radiation would carry a significant toxicity risk.  [name], MD

## 2020-02-27 ENCOUNTER — Telehealth: Payer: Self-pay | Admitting: Nurse Practitioner

## 2020-02-27 NOTE — Telephone Encounter (Signed)
Scheduled per los. Called, not able to leave msg. Mailed printout  

## 2020-03-05 ENCOUNTER — Encounter: Payer: Self-pay | Admitting: Radiation Oncology

## 2020-03-05 NOTE — Progress Notes (Addendum)
Histology and Location of Primary Cancer: Non-hodgkins Lymphoma. Dx. 2010.  Location(s) of Symptomatic tumor(s): Right forehead lesion  Past/Anticipated chemotherapy by medical oncology, if any:  1. High-grade B-cell non-Hodgkin's lymphoma presenting with pelvic pain found to have bulky pelvic lymphadenopathy January 2010. Status post 6 cycles of CHOP/Rituxan with a complete response. 2. 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 3. Chronic leukopenia. 4. Subcutaneous lesion right sternoclavicular notch-excisional biopsy 03/10/2014 confirmed a high-grade follicular lymphoma (grade 3A)  PET scan 03/22/2014. No clear evidence of active lymphoma. Extensive brown fat within the lower neck.   Restaging CT scans 06/23/2014-negative for lymphoma 5.Firm fullness superior to the right orbit February 2021  CT maxillofacial 10/28/2019-subperiosteal soft tissue thickening in the right frontal region, just above the superior orbital rim measuring  Excision right forehead lesion 02/03/2020-atypical lymphoid infiltrate consistent with non-Hodgkins B-cell lymphoma, low-grade process favored, unclear whether this represents recurrence of follicular lymphoma or a new disease process, possibly marginal zone lymphoma  CTs neck/chest/abdomen/pelvis 02/21/2020-no lymphadenopathy or other findings of recurrent lymphoma in the chest, abdomen or pelvis; no evidence of disease in the neck  Patient's main complaints related to symptomatic tumor(s) are: partially excised 02/03/20  Pain on a scale of 0-10 is: Denies pain. Reports tenderness at surgical site (right forehead).    If Spine Met(s), symptoms, if any, include:  Bowel/Bladder retention or incontinence (please describe): denies  Numbness or weakness in extremities (please describe):  Reports numbness top right of her head s/p surgery.   Current Decadron regimen, if applicable: denies  Ambulatory status? [place]? Wheelchair?: ambulatory  SAFETY ISSUES:  Prior radiation? yes  Pacemaker/ICD? denies  Possible current pregnancy? Denies   Is the patient on methotrexate? no  Additional Complaints / other details:  54 year old female. Married.

## 2020-03-06 ENCOUNTER — Other Ambulatory Visit: Payer: Self-pay

## 2020-03-06 ENCOUNTER — Ambulatory Visit
Admission: RE | Admit: 2020-03-06 | Discharge: 2020-03-06 | Disposition: A | Discharge status: Home / Self Care | Payer: BC Managed Care – PPO | Source: Ambulatory Visit | Attending: Radiation Oncology | Admitting: Radiation Oncology

## 2020-03-06 ENCOUNTER — Encounter: Payer: Self-pay | Admitting: Radiation Oncology

## 2020-03-06 VITALS — BP 110/75 | HR 51 | Temp 97.5°F | Resp 18 | Ht 66.0 in | Wt 156.4 lb

## 2020-03-06 DIAGNOSIS — Z87442 Personal history of urinary calculi: Secondary | ICD-10-CM | POA: Diagnosis not present

## 2020-03-06 DIAGNOSIS — Z8041 Family history of malignant neoplasm of ovary: Secondary | ICD-10-CM | POA: Diagnosis not present

## 2020-03-06 DIAGNOSIS — C8519 Unspecified B-cell lymphoma, extranodal and solid organ sites: Secondary | ICD-10-CM | POA: Insufficient documentation

## 2020-03-06 DIAGNOSIS — M129 Arthropathy, unspecified: Secondary | ICD-10-CM | POA: Diagnosis not present

## 2020-03-06 DIAGNOSIS — C8511 Unspecified B-cell lymphoma, lymph nodes of head, face, and neck: Secondary | ICD-10-CM | POA: Insufficient documentation

## 2020-03-06 DIAGNOSIS — C8590 Non-Hodgkin lymphoma, unspecified, unspecified site: Secondary | ICD-10-CM

## 2020-03-06 DIAGNOSIS — Z510 Encounter for antineoplastic radiation therapy: Secondary | ICD-10-CM | POA: Diagnosis present

## 2020-03-06 DIAGNOSIS — C8591 Non-Hodgkin lymphoma, unspecified, lymph nodes of head, face, and neck: Secondary | ICD-10-CM

## 2020-03-06 DIAGNOSIS — Z923 Personal history of irradiation: Secondary | ICD-10-CM | POA: Diagnosis not present

## 2020-03-06 NOTE — Progress Notes (Signed)
Radiation Oncology         [phone] ________________________________  Initial outpatient Consultation  Name: Katelyn Irwin MRN: [id number]  Date of Service: 03/06/2020 DOB: 1966/07/12  GY:JEHUDJ, Katelyn Bushy, MD  Ladell Pier, MD   REFERRING PHYSICIAN: Ladell Pier, MD  DIAGNOSIS: The encounter diagnosis was Non-Hodgkin's lymphoma of head [facility]).    ICD-10-CM   1. Non-Hodgkin's lymphoma of head ([place])  C85.91     HISTORY OF PRESENT ILLNESS: Katelyn Irwin is a 54 y.o. female seen at the request of Dr. Benay Spice. In summary, she was initially diagnosed with B-cell non-Hodgkin's lymphoma in 09/2008 with work up for pelvic pain. She was treated with 6 cycles of CHOP/Rituxan with complete response. However, she later developed disease recurrence with involvement in the left supraclavicular lymph nodes in 05/2011, which was treated with Rituxan and adjuvant radiation therapy followed by maintenance Rituxan- all therapy completed 04/16/2012. In 02/2014, she underwent excision of a subcutaneous lesion in the right sternoclavicular notch which confirmed a high-grade follicular lymphoma (grade IIIA). A PET scan on 03/22/2014 was without evidence of active lymphoma. She met with Dr. Reita Chard at [facility] for a second opinion and she also agreed with the recommendation to proceed in observation versus maintenance rituximab. Given the fact that she was asymptomatic and without evidence of measurable, active disease, she elected to proceed in observation. Restaging CT scans in 06/2014 were negative for lymphoma.  More recently, she presented with firm fullness superior to the right orbit in 10/2019. A maxillofacial CT performed on 10/28/2019 showed a subperiosteal soft tissue thickening in right frontal region just above the superior orbital rim. She underwent partial excision of the lesion under the care of Dr. Lind Guest on 02/03/2020 for tissue confirmation. Final surgical pathology confirmed atypical  lymphoid infiltrate consistent with non-Hodgkin's B-cell lymphoma, with a low-grade process favored but it was unclear as to whether it represented recurrence of her follicular lymphoma or a new disease process, possibly marginal zone lymphoma.    Her most recent restaging studies were performed on 02/21/2020, consisting of CT of the neck, chest, abdomen, and pelvis. All showed no evidence of active disease. Systemic therapy was not recommended but she has been referred back to [place] today to discuss the potential role of adjuvant post-op radiotherapy.   PREVIOUS RADIATION THERAPY: Yes  07/17/11 - 08/05/11: Left Neck- involved supraclavicular lymph node target was treated to 30 Gy in 15 fractions  PAST MEDICAL HISTORY:  Past Medical History:  Diagnosis Date  . Arthritis    back  . Cancer ([place])    b-cell lymphoma  . Complication of anesthesia 2012   slow to wake up past lymph node biopsy  . 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 Drug-induced leukopenia ([place]) 02/20/2012   Due to prior chemo for lymphoma  . High grade malignant lymphoma ([place]) 08/14/2011   Per pt/ had 3 times since 2010-2015  . History of kidney stones    passed  . History of radiation therapy 07/17/11- 08/05/11   left neck30gy/5 fxs  . Low grade B-cell lymphoma ([employer]) 08/14/2011  . Lymphoma ([place])   . Lymphoma ([place])   . Pneumonia 2010  . Pyloric stenosis, congenital    surgical repair as infant      PAST SURGICAL HISTORY: Past Surgical History:  Procedure Laterality Date  . COLONOSCOPY    . LESION REMOVAL Right 02/03/2020   Procedure: EXCISION OF RIGHT FOREHEAD LESION;  Surgeon: Helayne Seminole, MD;  Location: [facility];  Service: ENT;  Laterality: Right;  . LYMPH NODE BIOPSY Left    neck  . 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 pyloric stenosis surgery     as infant    FAMILY HISTORY:  Family History  Problem Relation Age of Onset  . Ovarian cancer Maternal Grandmother      SOCIAL HISTORY:  Social History   Socioeconomic History  . Marital status: Married    Spouse name: Not on file  . Number of children: 3  . Years of education: Not on file  . Highest education level: Not on file  Occupational History  . Occupation: Pharmacist, hospital  Tobacco Use  . Smoking status: Never Smoker  . Smokeless tobacco: Never Used  Vaping Use  . Vaping Use: Never used  Substance and Sexual Activity  . Alcohol use: No  . Drug use: No  . Sexual activity: Yes  Other Topics Concern  . Not on file  Social History Narrative  . Not on file   Social Determinants of Health   Financial Resource Strain:   . Difficulty of Paying Living Expenses:   Food Insecurity:   . Worried About Charity fundraiser in the Last Year:   . Arboriculturist in the Last Year:   Transportation Needs:   . Film/video editor (Medical):   Marland Kitchen Lack of Transportation (Non-Medical):   Physical Activity:   . Days of Exercise per Week:   . Minutes of Exercise per Session:   Stress:   . Feeling of Stress :   Social Connections:   . Frequency of Communication with Friends and Family:   . Frequency of Social Gatherings with Friends and Family:   . Attends Religious Services:   . Active Member of Clubs or Organizations:   . Attends Archivist Meetings:   Marland Kitchen Marital Status:   Intimate Partner Violence:   . Fear of Current or Ex-Partner:   . Emotionally Abused:   Marland Kitchen Physically Abused:   . Sexually Abused:     ALLERGIES: Patient has no known allergies.  MEDICATIONS:  Current Outpatient Medications  Medication Sig Dispense Refill  . Multiple Vitamin (MULTIVITAMIN) tablet Take 1 tablet by mouth daily.       No current facility-administered medications for this encounter.   Facility-Administered Medications Ordered in Other Encounters  Medication Dose Route Frequency Provider Last Rate Last Admin  . hyaluronate sodium (RADIAPLEXRX) gel   Topical BID Tyler Pita, MD         REVIEW OF SYSTEMS:  On review of systems, the patient reports that she is doing well overall. Her incision is healing well and she denies pain, bleeding, erythema or pus at incision site.  She has an area of residual numbness on the top of her head and more over to the right but reports that this is gradually improving weekly. She also reports mild residual numbness/tingling at the incision site and just above, gradually improving as well. She is currently unable to raise her right brow postoperatively but denies any changes in visual acuity, diplopia or eye twitching. She denies any chest pain, shortness of breath, cough, fevers, chills, night sweats, or unintended weight changes. She denies any bowel or bladder disturbances, and denies abdominal pain, nausea or vomiting. She denies any new musculoskeletal or joint aches or pains. A complete review of systems is obtained and is otherwise negative.    PHYSICAL EXAM:  Wt Readings from Last 3 Encounters:  03/06/20 156 lb 6.4 oz (70.9 kg)  02/24/20 154 lb 12.8 oz (70.2 kg)  02/03/20 158 lb (71.7 kg)   Temp Readings from Last 3 Encounters:  03/06/20 (!) 97.5 F (36.4 C)  02/24/20 97.7 F (36.5 C) (Temporal)  02/03/20 98.1 F (36.7 C)   BP Readings from Last 3 Encounters:  03/06/20 110/75  02/24/20 115/75  02/03/20 109/72   Pulse Readings from Last 3 Encounters:  03/06/20 (!) 51  02/24/20 62  02/03/20 60   Pain Assessment Pain Score: 0-No pain/10  In general this is a well appearing Caucasian woman in no acute distress. She is alert and oriented x4 and appropriate throughout the examination. HEENT reveals that the patient is normocephalic, atraumatic. EOMs are intact. PERRLA. Skin is intact without any evidence of gross lesions. Her icision at the lateral 3rd of the right brow is well healed and without signs of infection.  There is no palpable firmness or abnormality about the incision or surrounding tissues and she is only minimal  tender to palpation. There is no erythem or discharge at the incision site. Cardiopulmonary assessment is negative for acute distress and she exhibits normal effort. Lymphatic assessment is performed and does not reveal any adenopathy in the cervical, supraclavicular or axillary chains. The abdomen is soft, non tender, non distended. Lower extremities are negative for pretibial pitting edema, deep calf tenderness, cyanosis or clubbing.  KPS = 100  100 - Normal; no complaints; no evidence of disease. 90   - Able to carry on normal activity; minor signs or symptoms of disease. 80   - Normal activity with effort; some signs or symptoms of disease. 8   - Cares for self; unable to carry on normal activity or to do active work. 60   - Requires occasional assistance, but is able to care for most of his personal needs. 50   - Requires considerable assistance and frequent medical care. 33   - Disabled; requires special care and assistance. 59   - Severely disabled; hospital admission is indicated although death not imminent. 82   - Very sick; hospital admission necessary; active supportive treatment necessary. 10   - Moribund; fatal processes progressing rapidly. 0     - Dead  Karnofsky DA, Abelmann [place], Craver LS and Burchenal [facility] [phone]) The use of the nitrogen mustards in the palliative treatment of carcinoma: with particular reference to bronchogenic carcinoma Cancer 1 634-56  LABORATORY DATA:  Lab Results  Component Value Date   WBC 4.8 01/20/2017   HGB 12.9 02/03/2020   HCT 40.4 01/20/2017   MCV 91.5 01/20/2017   PLT 202 01/20/2017   Lab Results  Component Value Date   NA 142 02/03/2020   K 3.9 02/03/2020   CL 106 02/03/2020   CO2 29 02/03/2020   Lab Results  Component Value Date   ALT 32 01/20/2017   AST 23 01/20/2017   ALKPHOS 30 (L) 01/20/2017   BILITOT 1.46 (H) 01/20/2017     RADIOGRAPHY: CT Soft Tissue Neck W Contrast  Result Date: 02/22/2020 CLINICAL DATA:  Restaging  lymphoma EXAM: CT NECK WITH CONTRAST TECHNIQUE: Multidetector CT imaging of the neck was performed using the standard protocol following the bolus administration of intravenous contrast. CONTRAST:  136m OMNIPAQUE IOHEXOL 300 MG/ML  SOLN COMPARISON:  06/23/2014 FINDINGS: Pharynx and larynx: No thickening of Waldeyer's ring. Salivary glands: No inflammation, mass, or stone. Thyroid: Normal. Lymph nodes: None enlarged or abnormal density. Vascular:  Negative. Limited intracranial: Negative. Visualized orbits: Negative. Mastoids and visualized paranasal sinuses: Clear Skeleton: No destructive changes Upper chest: Described separately Other: The site of right eyebrow recurrence on prior was not covered today. IMPRESSION: No evidence of disease in the neck. Electronically Signed   By: Monte Fantasia M.D.   On: 02/22/2020 06:37   CT Chest W Contrast  Result Date: 02/21/2020 CLINICAL DATA:  Primary Cancer Type: Lymphoma Imaging Indication: Restaging for new clinical signs or symptoms. Right forehead lesion, excised 02/03/2020. Interval therapy since last imaging? No Initial Cancer Diagnosis Date: 10/16/2008; Established by: Biopsy-proven Detailed Pathology: Non-Hodgkin's lymphoma; high grade B-cell lymphoma. Recurrence?  Yes Date(s) of recurrence: 05/2011; Low-grade lymphoma involving left supraclavicular lymph nodes. Date(s) of recurrence: 03/10/2014; Grade 3A high-grade follicular lymphoma. Chemotherapy: Yes; Ongoing?  No; CHOP 2010 Immunotherapy?  Yes; Type: Rituxin; Ongoing? No Radiation therapy? Yes; Date Range: 07/17/2011-08/05/2011; Target: Left neck EXAM: CT CHEST, ABDOMEN, AND PELVIS WITH CONTRAST TECHNIQUE: Multidetector CT imaging of the chest, abdomen and pelvis was performed following the standard protocol during bolus administration of intravenous contrast. CONTRAST:  172m OMNIPAQUE IOHEXOL 300 MG/ML  SOLN COMPARISON:  Most recent CT chest, abdomen and pelvis 06/23/2014. 03/22/2014 PET-CT. FINDINGS: CT  CHEST FINDINGS Cardiovascular: Normal heart size. No significant pericardial effusion/thickening. Great vessels are normal in course and caliber. No central pulmonary emboli. Mediastinum/Nodes: No discrete thyroid nodules. Unremarkable esophagus. No pathologically enlarged axillary, mediastinal or hilar lymph nodes. Lungs/Pleura: No pneumothorax. No pleural effusion. No acute consolidative airspace disease, lung masses or significant pulmonary nodules. Musculoskeletal: No aggressive appearing focal osseous lesions. Mild thoracic spondylosis. CT ABDOMEN PELVIS FINDINGS Hepatobiliary: Normal liver with no liver mass. Normal gallbladder with no radiopaque cholelithiasis. No biliary ductal dilatation. Pancreas: Normal, with no mass or duct dilation. Spleen: Normal size. No mass. Adrenals/Urinary Tract: Normal adrenals. Normal kidneys with no hydronephrosis and no renal mass. Normal bladder. Stomach/Bowel: Normal non-distended stomach. Normal caliber small bowel with no small bowel wall thickening. Normal appendix. Oral contrast transits to the left colon. Normal large bowel with no diverticulosis, large bowel wall thickening or pericolonic fat stranding. Vascular/Lymphatic: Normal caliber abdominal aorta. Patent portal, splenic, hepatic and renal veins. No pathologically enlarged lymph nodes in the abdomen or pelvis. Reproductive: Grossly normal uterus.  No adnexal mass. Other: No pneumoperitoneum, ascites or focal fluid collection. Musculoskeletal: No aggressive appearing focal osseous lesions. IMPRESSION: No lymphadenopathy or other findings of recurrent lymphoma in the chest, abdomen or pelvis. Electronically Signed   By: JIlona SorrelM.D.   On: 02/21/2020 12:01   CT Abdomen Pelvis W Contrast  Result Date: 02/21/2020 CLINICAL DATA:  Primary Cancer Type: Lymphoma Imaging Indication: Restaging for new clinical signs or symptoms. Right forehead lesion, excised 02/03/2020. Interval therapy since last imaging? No  Initial Cancer Diagnosis Date: 10/16/2008; Established by: Biopsy-proven Detailed Pathology: Non-Hodgkin's lymphoma; high grade B-cell lymphoma. Recurrence?  Yes Date(s) of recurrence: 05/2011; Low-grade lymphoma involving left supraclavicular lymph nodes. Date(s) of recurrence: 03/10/2014; Grade 3A high-grade follicular lymphoma. Chemotherapy: Yes; Ongoing?  No; CHOP 2010 Immunotherapy?  Yes; Type: Rituxin; Ongoing? No Radiation therapy? Yes; Date Range: 07/17/2011-08/05/2011; Target: Left neck EXAM: CT CHEST, ABDOMEN, AND PELVIS WITH CONTRAST TECHNIQUE: Multidetector CT imaging of the chest, abdomen and pelvis was performed following the standard protocol during bolus administration of intravenous contrast. CONTRAST:  1073mOMNIPAQUE IOHEXOL 300 MG/ML  SOLN COMPARISON:  Most recent CT chest, abdomen and pelvis 06/23/2014. 03/22/2014 PET-CT. FINDINGS: CT CHEST FINDINGS Cardiovascular: Normal heart size. No significant pericardial effusion/thickening. Great vessels  are normal in course and caliber. No central pulmonary emboli. Mediastinum/Nodes: No discrete thyroid nodules. Unremarkable esophagus. No pathologically enlarged axillary, mediastinal or hilar lymph nodes. Lungs/Pleura: No pneumothorax. No pleural effusion. No acute consolidative airspace disease, lung masses or significant pulmonary nodules. Musculoskeletal: No aggressive appearing focal osseous lesions. Mild thoracic spondylosis. CT ABDOMEN PELVIS FINDINGS Hepatobiliary: Normal liver with no liver mass. Normal gallbladder with no radiopaque cholelithiasis. No biliary ductal dilatation. Pancreas: Normal, with no mass or duct dilation. Spleen: Normal size. No mass. Adrenals/Urinary Tract: Normal adrenals. Normal kidneys with no hydronephrosis and no renal mass. Normal bladder. Stomach/Bowel: Normal non-distended stomach. Normal caliber small bowel with no small bowel wall thickening. Normal appendix. Oral contrast transits to the left colon. Normal large  bowel with no diverticulosis, large bowel wall thickening or pericolonic fat stranding. Vascular/Lymphatic: Normal caliber abdominal aorta. Patent portal, splenic, hepatic and renal veins. No pathologically enlarged lymph nodes in the abdomen or pelvis. Reproductive: Grossly normal uterus.  No adnexal mass. Other: No pneumoperitoneum, ascites or focal fluid collection. Musculoskeletal: No aggressive appearing focal osseous lesions. IMPRESSION: No lymphadenopathy or other findings of recurrent lymphoma in the chest, abdomen or pelvis. Electronically Signed   By: Ilona Sorrel M.D.   On: 02/21/2020 12:01      IMPRESSION/PLAN: 1. 54 y.o. female with recurrent, low grade B-cell non-Hodgkin's lymphoma involving right frontal region at the lateral third of the right brow. Today, we talked to the patient and her husband about the findings and workup thus far. We discussed the natural history of lymphoma and general treatment, highlighting the role of adjuvant radiotherapy in the management. We discussed the available radiation techniques, and focused on the details and logistics of delivery. The recommendation is to proceed with a 3 week course of adjuvant radiotherapy to the incision site at the lateral 3rd of the right brow. We reviewed the anticipated acute and late sequelae associated with radiation in this setting. The patient was encouraged to ask questions that were answered to her satisfaction.  At the conclusion of our visit, she elects to proceed with the recommended 3 week course of adjuvant radiotherapy  to the incision site at the lateral 3rd of the right brow.  She is familiar with radiation, having undergone treatment to a lymph node recurrence in the left neck previously in 2012, which she tolerated very well. She has freely signed written consent to proceed and a copy of this document will be placed in her chart. She is eager to get her radiation started and completed prior to a planned beach trip  later in July.  Therefore, she will proceed with CT SIM this afternoon at 1:30pm in anticipation of beginning her daily treatments later this week. She appears to have a good understanding of her disease and our recommendations for treatment which are of curative intent. Therefore, we will share our discussion with Dr. Benay Spice and proceed with treatment planning and treatment accordingly.     Nicholos Johns, PA-C    Tyler Pita, MD  [facility] Direct Dial: [phone]  Fax: [phone] [email or web address]  Skype  [email or web address]   This document serves as a record of services personally performed by Tyler Pita, MD and Freeman Caldron, PA-C. It was created on their behalf by Wilburn Mylar, a trained medical scribe. The creation of this record is based on the scribe's personal observations and the provider's statements to them. This document has been checked and approved by the attending provider.

## 2020-03-07 DIAGNOSIS — C8591 Non-Hodgkin lymphoma, unspecified, lymph nodes of head, face, and neck: Secondary | ICD-10-CM | POA: Insufficient documentation

## 2020-03-07 NOTE — Progress Notes (Signed)
  Radiation Oncology         [phone] ________________________________  Name: LEOMIA BLAKE MRN: [id number]  Date: 03/06/2020  DOB: October 09, 1965  SIMULATION AND TREATMENT PLANNING NOTE    ICD-10-CM   1. Non-Hodgkin's lymphoma of head ([place])  C85.91     DIAGNOSIS:  54 yo woman with  recurrent, low grade B-cell non-Hodgkin's lymphoma involving right frontal region at the lateral third of the right brow.  NARRATIVE:  The patient was brought to the [facility].  Identity was confirmed.  All relevant records and images related to the planned course of therapy were reviewed.  The patient freely provided informed written consent to proceed with treatment after reviewing the details related to the planned course of therapy. The consent form was witnessed and verified by the simulation staff.  Then, the patient was set-up in a stable reproducible  supine position for radiation therapy.  CT images were obtained.  Surface markings were placed.  The CT images were loaded into the planning software.  Then the target and avoidance structures were contoured.  Treatment planning then occurred.  The radiation prescription was entered and confirmed.  Then, I designed and supervised the construction of a total of 3 medically necessary complex treatment devices, including a custom made thermoplastic open face mask used for immobilization, one complex lead cutout to shape electrons around the target shieldign the eye and bolus.   The field will be treated with 6 MeV electrons.  I have requested : Isodose Plan.    PLAN:  The whole brain will be treated to 30 Gy in 15 fractions.  ________________________________  Sheral Apley Tammi Klippel, M.D.

## 2020-03-08 ENCOUNTER — Ambulatory Visit
Admission: RE | Admit: 2020-03-08 | Discharge: 2020-03-08 | Disposition: A | Discharge status: Home / Self Care | Payer: BC Managed Care – PPO | Source: Ambulatory Visit | Attending: Radiation Oncology | Admitting: Radiation Oncology

## 2020-03-08 ENCOUNTER — Other Ambulatory Visit: Payer: Self-pay

## 2020-03-08 DIAGNOSIS — C8510 Unspecified B-cell lymphoma, unspecified site: Secondary | ICD-10-CM

## 2020-03-08 DIAGNOSIS — C8519 Unspecified B-cell lymphoma, extranodal and solid organ sites: Secondary | ICD-10-CM | POA: Diagnosis not present

## 2020-03-09 ENCOUNTER — Other Ambulatory Visit: Payer: Self-pay

## 2020-03-09 ENCOUNTER — Ambulatory Visit
Admission: RE | Admit: 2020-03-09 | Discharge: 2020-03-09 | Disposition: A | Discharge status: Home / Self Care | Payer: BC Managed Care – PPO | Source: Ambulatory Visit | Attending: Radiation Oncology | Admitting: Radiation Oncology

## 2020-03-09 DIAGNOSIS — C8519 Unspecified B-cell lymphoma, extranodal and solid organ sites: Secondary | ICD-10-CM | POA: Diagnosis not present

## 2020-03-11 NOTE — Progress Notes (Signed)
  Radiation Oncology         [phone] ________________________________  Name: Katelyn Irwin MRN: [id number]  Date: 03/08/2020  DOB: 1965-11-07  SIMULATION NOTE    ICD-10-CM   1. Low grade B-cell lymphoma ([place])  C85.10     NARRATIVE:  The patient was brought to the [facility] treatment suite.  Identity was confirmed.  All relevant records and images related to the planned course of therapy were reviewed.  Then, the patient was set-up in a stable reproducible  supine position for radiation therapy.  She was set up in her mask.  I applied a skin surface lead collimator over the eye and bolus material.  I verified the position of the field to cover the surgical bed while avoiding the eyes.  PLAN:  The patient will receive 30 Gy in 15 fractions.  ________________________________  Sheral Apley Tammi Klippel, M.D.

## 2020-03-12 ENCOUNTER — Ambulatory Visit
Admission: RE | Admit: 2020-03-12 | Discharge: 2020-03-12 | Disposition: A | Discharge status: Home / Self Care | Payer: BC Managed Care – PPO | Source: Ambulatory Visit | Attending: Radiation Oncology | Admitting: Radiation Oncology

## 2020-03-12 ENCOUNTER — Other Ambulatory Visit: Payer: Self-pay

## 2020-03-12 DIAGNOSIS — C8590 Non-Hodgkin lymphoma, unspecified, unspecified site: Secondary | ICD-10-CM

## 2020-03-12 DIAGNOSIS — C8519 Unspecified B-cell lymphoma, extranodal and solid organ sites: Secondary | ICD-10-CM | POA: Diagnosis not present

## 2020-03-12 MED ORDER — SONAFINE EX EMUL
1.0000 "application " | Freq: Once | CUTANEOUS | Status: AC
  Administered 2020-03-12: 1 via TOPICAL

## 2020-03-13 ENCOUNTER — Ambulatory Visit
Admission: RE | Admit: 2020-03-13 | Discharge: 2020-03-13 | Disposition: A | Discharge status: Home / Self Care | Payer: BC Managed Care – PPO | Source: Ambulatory Visit | Attending: Radiation Oncology | Admitting: Radiation Oncology

## 2020-03-13 ENCOUNTER — Other Ambulatory Visit: Payer: Self-pay

## 2020-03-13 DIAGNOSIS — C8519 Unspecified B-cell lymphoma, extranodal and solid organ sites: Secondary | ICD-10-CM | POA: Diagnosis not present

## 2020-03-14 ENCOUNTER — Other Ambulatory Visit: Payer: Self-pay

## 2020-03-14 ENCOUNTER — Ambulatory Visit
Admission: RE | Admit: 2020-03-14 | Discharge: 2020-03-14 | Disposition: A | Discharge status: Home / Self Care | Payer: BC Managed Care – PPO | Source: Ambulatory Visit | Attending: Radiation Oncology | Admitting: Radiation Oncology

## 2020-03-14 DIAGNOSIS — C8519 Unspecified B-cell lymphoma, extranodal and solid organ sites: Secondary | ICD-10-CM | POA: Diagnosis not present

## 2020-03-15 ENCOUNTER — Other Ambulatory Visit: Payer: Self-pay

## 2020-03-15 ENCOUNTER — Ambulatory Visit
Admission: RE | Admit: 2020-03-15 | Discharge: 2020-03-15 | Disposition: A | Discharge status: Home / Self Care | Payer: BC Managed Care – PPO | Source: Ambulatory Visit | Attending: Radiation Oncology | Admitting: Radiation Oncology

## 2020-03-15 DIAGNOSIS — C8519 Unspecified B-cell lymphoma, extranodal and solid organ sites: Secondary | ICD-10-CM | POA: Diagnosis present

## 2020-03-15 DIAGNOSIS — Z510 Encounter for antineoplastic radiation therapy: Secondary | ICD-10-CM | POA: Diagnosis present

## 2020-03-16 ENCOUNTER — Other Ambulatory Visit: Payer: Self-pay

## 2020-03-16 ENCOUNTER — Ambulatory Visit
Admission: RE | Admit: 2020-03-16 | Discharge: 2020-03-16 | Disposition: A | Discharge status: Home / Self Care | Payer: BC Managed Care – PPO | Source: Ambulatory Visit | Attending: Radiation Oncology | Admitting: Radiation Oncology

## 2020-03-16 DIAGNOSIS — C8519 Unspecified B-cell lymphoma, extranodal and solid organ sites: Secondary | ICD-10-CM | POA: Diagnosis not present

## 2020-03-20 ENCOUNTER — Ambulatory Visit
Admission: RE | Admit: 2020-03-20 | Discharge: 2020-03-20 | Disposition: A | Discharge status: Home / Self Care | Payer: BC Managed Care – PPO | Source: Ambulatory Visit | Attending: Radiation Oncology | Admitting: Radiation Oncology

## 2020-03-20 ENCOUNTER — Other Ambulatory Visit: Payer: Self-pay

## 2020-03-20 DIAGNOSIS — C8519 Unspecified B-cell lymphoma, extranodal and solid organ sites: Secondary | ICD-10-CM | POA: Diagnosis not present

## 2020-03-21 ENCOUNTER — Other Ambulatory Visit: Payer: Self-pay

## 2020-03-21 ENCOUNTER — Ambulatory Visit
Admission: RE | Admit: 2020-03-21 | Discharge: 2020-03-21 | Disposition: A | Discharge status: Home / Self Care | Payer: BC Managed Care – PPO | Source: Ambulatory Visit | Attending: Radiation Oncology | Admitting: Radiation Oncology

## 2020-03-21 DIAGNOSIS — C8519 Unspecified B-cell lymphoma, extranodal and solid organ sites: Secondary | ICD-10-CM | POA: Diagnosis not present

## 2020-03-22 ENCOUNTER — Ambulatory Visit
Admission: RE | Admit: 2020-03-22 | Discharge: 2020-03-22 | Disposition: A | Discharge status: Home / Self Care | Payer: BC Managed Care – PPO | Source: Ambulatory Visit | Attending: Radiation Oncology | Admitting: Radiation Oncology

## 2020-03-22 ENCOUNTER — Other Ambulatory Visit: Payer: Self-pay

## 2020-03-22 DIAGNOSIS — C8519 Unspecified B-cell lymphoma, extranodal and solid organ sites: Secondary | ICD-10-CM | POA: Diagnosis not present

## 2020-03-23 ENCOUNTER — Other Ambulatory Visit: Payer: Self-pay

## 2020-03-23 ENCOUNTER — Ambulatory Visit
Admission: RE | Admit: 2020-03-23 | Discharge: 2020-03-23 | Disposition: A | Discharge status: Home / Self Care | Payer: BC Managed Care – PPO | Source: Ambulatory Visit | Attending: Radiation Oncology | Admitting: Radiation Oncology

## 2020-03-23 DIAGNOSIS — C8519 Unspecified B-cell lymphoma, extranodal and solid organ sites: Secondary | ICD-10-CM | POA: Diagnosis not present

## 2020-03-26 ENCOUNTER — Ambulatory Visit
Admission: RE | Admit: 2020-03-26 | Discharge: 2020-03-26 | Disposition: A | Discharge status: Home / Self Care | Payer: BC Managed Care – PPO | Source: Ambulatory Visit | Attending: Radiation Oncology | Admitting: Radiation Oncology

## 2020-03-26 DIAGNOSIS — C8519 Unspecified B-cell lymphoma, extranodal and solid organ sites: Secondary | ICD-10-CM | POA: Diagnosis not present

## 2020-03-27 ENCOUNTER — Ambulatory Visit
Admission: RE | Admit: 2020-03-27 | Discharge: 2020-03-27 | Disposition: A | Discharge status: Home / Self Care | Payer: BC Managed Care – PPO | Source: Ambulatory Visit | Attending: Radiation Oncology | Admitting: Radiation Oncology

## 2020-03-27 ENCOUNTER — Other Ambulatory Visit: Payer: Self-pay

## 2020-03-27 DIAGNOSIS — C8519 Unspecified B-cell lymphoma, extranodal and solid organ sites: Secondary | ICD-10-CM | POA: Diagnosis not present

## 2020-03-28 ENCOUNTER — Ambulatory Visit
Admission: RE | Admit: 2020-03-28 | Discharge: 2020-03-28 | Disposition: A | Discharge status: Home / Self Care | Payer: BC Managed Care – PPO | Source: Ambulatory Visit | Attending: Radiation Oncology | Admitting: Radiation Oncology

## 2020-03-28 ENCOUNTER — Other Ambulatory Visit: Payer: Self-pay

## 2020-03-28 DIAGNOSIS — C8519 Unspecified B-cell lymphoma, extranodal and solid organ sites: Secondary | ICD-10-CM | POA: Diagnosis not present

## 2020-03-29 ENCOUNTER — Encounter: Payer: Self-pay | Admitting: Urology

## 2020-03-29 ENCOUNTER — Other Ambulatory Visit: Payer: Self-pay

## 2020-03-29 ENCOUNTER — Ambulatory Visit
Admission: RE | Admit: 2020-03-29 | Discharge: 2020-03-29 | Disposition: A | Discharge status: Home / Self Care | Payer: BC Managed Care – PPO | Source: Ambulatory Visit | Attending: Radiation Oncology | Admitting: Radiation Oncology

## 2020-03-29 DIAGNOSIS — C8519 Unspecified B-cell lymphoma, extranodal and solid organ sites: Secondary | ICD-10-CM | POA: Diagnosis not present

## 2020-05-01 ENCOUNTER — Other Ambulatory Visit: Payer: Self-pay

## 2020-05-01 ENCOUNTER — Inpatient Hospital Stay: Payer: BC Managed Care – PPO

## 2020-05-01 ENCOUNTER — Inpatient Hospital Stay: Payer: BC Managed Care – PPO | Attending: Nurse Practitioner | Admitting: Oncology

## 2020-05-01 VITALS — BP 121/76 | HR 54 | Temp 97.5°F | Resp 16 | Ht 66.0 in | Wt 155.1 lb

## 2020-05-01 DIAGNOSIS — C8516 Unspecified B-cell lymphoma, intrapelvic lymph nodes: Secondary | ICD-10-CM | POA: Insufficient documentation

## 2020-05-01 DIAGNOSIS — C8510 Unspecified B-cell lymphoma, unspecified site: Secondary | ICD-10-CM

## 2020-05-01 DIAGNOSIS — Z79899 Other long term (current) drug therapy: Secondary | ICD-10-CM | POA: Diagnosis not present

## 2020-05-01 DIAGNOSIS — C8282 Other types of follicular lymphoma, intrathoracic lymph nodes: Secondary | ICD-10-CM | POA: Diagnosis not present

## 2020-05-01 DIAGNOSIS — D72819 Decreased white blood cell count, unspecified: Secondary | ICD-10-CM | POA: Insufficient documentation

## 2020-05-01 DIAGNOSIS — Z923 Personal history of irradiation: Secondary | ICD-10-CM | POA: Insufficient documentation

## 2020-05-01 LAB — TSH: TSH: 1.989 u[IU]/mL (ref 0.308–3.960)

## 2020-05-01 NOTE — Progress Notes (Signed)
  [facility] OFFICE PROGRESS NOTE   Diagnosis: Non-Hodgkin's lymphoma  INTERVAL HISTORY:   Ms. Clavin returns as scheduled.  She completed the course of adjuvant radiation to the right frontal area on 03/29/2020.  She tolerated the radiation well.  She had erythema in the radiation port and has started losing her right eyebrow over the past few weeks.  She feels well.  Objective:  Vital signs in last 24 hours:  Blood pressure 121/76, pulse (!) 54, temperature (!) 97.5 F (36.4 C), temperature source Tympanic, resp. rate 16, height 5\' 6"  (1.676 m), weight 155 lb 1.6 oz (70.4 kg), SpO2 100 %.    HEENT: Faint radiation hyperpigmentation surrounding the right frontal scar, no palpable mass, partial loss of the right eyebrow Lymphatics: No cervical, supraclavicular, axillary, or inguinal nodes Resp: Lungs clear bilaterally Cardio: Regular rate and rhythm GI: No mass, nontender, no hepatosplenomegaly Vascular: No leg edema   Lab Results:  Lab Results  Component Value Date   WBC 4.8 01/20/2017   HGB 12.9 02/03/2020   HCT 40.4 01/20/2017   MCV 91.5 01/20/2017   PLT 202 01/20/2017   NEUTROABS 2.0 01/20/2017    CMP  Lab Results  Component Value Date   NA 142 02/03/2020   K 3.9 02/03/2020   CL 106 02/03/2020   CO2 29 02/03/2020   GLUCOSE 125 (H) 02/03/2020   BUN 17 02/03/2020   CREATININE 0.93 02/03/2020   CALCIUM 9.7 02/03/2020   PROT 7.3 01/20/2017   ALBUMIN 4.4 01/20/2017   AST 23 01/20/2017   ALT 32 01/20/2017   ALKPHOS 30 (L) 01/20/2017   BILITOT 1.46 (H) 01/20/2017   GFRNONAA >60 02/03/2020   GFRAA >60 02/03/2020    Medications: I have reviewed the patient's current medications.   Assessment/Plan: 1. High-grade B-cell non-Hodgkin's lymphoma presenting with pelvic pain found to have bulky pelvic lymphadenopathy January 2010. Status post 6 cycles of CHOP/Rituxan with a complete response. 2. Low-grade lymphoma involving left supraclavicular lymph  nodes September 2012 status post single agent Rituxan plus involved field radiation then maintenance Rituxan with all planned treatments completed 04/16/2012. 3. Chronic leukopenia. 4. Subcutaneous lesion right sternoclavicular notch-excisional biopsy 03/10/2014 confirmed a high-grade follicular lymphoma (grade 3A)  PET scan 03/22/2014. No clear evidence of active lymphoma. Extensive brown fat within the lower neck.   Restaging CT scans 06/23/2014-negative for lymphoma 5.  Firm fullness superior to the right orbit February 2021  CT maxillofacial 10/28/2019-subperiosteal soft tissue thickening in the right frontal region, just above the superior orbital rim measuring  Excision right forehead lesion 02/03/2020-atypical lymphoid infiltrate consistent with non-Hodgkins B-cell lymphoma, low-grade process favored, unclear whether this represents recurrence of follicular lymphoma or a new disease process, possibly marginal zone lymphoma  CTs neck/chest/abdomen/pelvis 02/21/2020-no lymphadenopathy or other findings of recurrent lymphoma in the chest, abdomen or pelvis; no evidence of disease in the neck  Radiation to right frontal scalp/skull 03/08/2020-03/29/2020, 30 Gy in 15 fractions    Disposition: Ms. Albin completed the course of adjuvant radiation to the right frontal surgical site.  She is in clinical remission from non-Hodgkin's lymphoma.  She will return for an office and lab visit in 4 months.  I encouraged Ms. Bartolome to obtain the COVID-19 booster vaccine.  [name], MD  05/01/2020  1:36 PM

## 2020-05-02 ENCOUNTER — Telehealth: Payer: Self-pay | Admitting: *Deleted

## 2020-05-02 ENCOUNTER — Ambulatory Visit: Payer: BC Managed Care – PPO | Admitting: Urology

## 2020-05-02 NOTE — Telephone Encounter (Signed)
CALLED PATIENT TO ASK ABOUT COMING FOR AN IN PERSON VISIT ON 05-04-20 @ 2:30 PM, SPOKE WITH PATIENT AND SHE AGREED TO DO SO

## 2020-05-04 ENCOUNTER — Encounter: Payer: Self-pay | Admitting: Urology

## 2020-05-04 ENCOUNTER — Other Ambulatory Visit: Payer: Self-pay

## 2020-05-04 ENCOUNTER — Ambulatory Visit
Admission: RE | Admit: 2020-05-04 | Discharge: 2020-05-04 | Disposition: A | Discharge status: Home / Self Care | Payer: BC Managed Care – PPO | Source: Ambulatory Visit | Attending: Urology | Admitting: Urology

## 2020-05-04 VITALS — BP 101/73 | HR 61 | Temp 98.9°F | Wt 155.0 lb

## 2020-05-04 DIAGNOSIS — C8591 Non-Hodgkin lymphoma, unspecified, lymph nodes of head, face, and neck: Secondary | ICD-10-CM

## 2020-05-04 NOTE — Progress Notes (Signed)
  Radiation Oncology         [phone]) [phone] ________________________________  Name: Katelyn Irwin MRN: [id number]  Date: 03/29/2020  DOB: 12/16/1965  End of Treatment Note  Diagnosis:    54 y.o. female with recurrent, low grade B-cell non-Hodgkin's lymphoma involving right frontal region at the lateral third of the right brow.     Indication for treatment:  Adjuvant, palliative       Radiation treatment dates:   03/08/20 - 03/29/20  Site/dose:   The right frontal region, at the site of disease recurrence at the lateral third of the right brow, was treated to 30 Gy in 15 fractions.  Beams/energy:   An electron field set-up was employed with 6E beams  Narrative: The patient tolerated radiation treatment relatively well with only faint hyperpigmentation above right eyebrow/in treatment field noted without desquamation. No alopecia noted. She denied dry eye or significant fatigue.   Plan: The patient has completed radiation treatment. The patient will return to radiation oncology clinic for routine followup in one month. I advised her to call or return sooner if she has any questions or concerns related to her recovery or treatment. ________________________________  Sheral Apley. Tammi Klippel, M.D.

## 2020-05-04 NOTE — Progress Notes (Signed)
  Radiation Oncology         ([phone]) [phone] ________________________________  Name: Katelyn Irwin MRN: [id number]  Date: 05/04/2020  DOB: 1966-07-29  Post Treatment Note  CC: Patient, No Pcp Per  Ladell Pier, MD  Diagnosis:    54 y.o. female with recurrent, low grade B-cell non-Hodgkin's lymphoma involving right frontal region at the lateral third of the right brow.  Interval Since Last Radiation:  5 weeks  03/08/20 - 03/29/20:  The right frontal region, at the site of disease recurrence at the lateral third of the right brow, was treated to 30Gy in 51fractions.  Narrative:  The patient returns today for routine follow-up.   She tolerated the radiation therapy relatively well with only faint hyperpigmentation above the right eyebrow, in the treatment field, without desquamation. She denied alopecia, dry eye or fatigue.            On review of systems, the patient states that she is doing very well in general.  The redness at the treatment site has completely resolved and although she has lost her right eyebrow, she denies any pain or palpable abnormalities.  She denies dry or visual changes and has not noticed any significant change in her energy level.  She reports a healthy appetite and is maintaining her weight.  Overall, she is quite pleased with her progress to date.  ALLERGIES:  has No Known Allergies.  Meds: Current Outpatient Medications  Medication Sig Dispense Refill  . Multiple Vitamin (MULTIVITAMIN) tablet Take 1 tablet by mouth daily.       No current facility-administered medications for this encounter.   Facility-Administered Medications Ordered in Other Encounters  Medication Dose Route Frequency Provider Last Rate Last Admin  . hyaluronate sodium (RADIAPLEXRX) gel   Topical BID Tyler Pita, MD        Physical Findings:  vitals were not taken for this visit.   /10 In general this is a well appearing Caucasian female in no acute distress.She's alert and  oriented x4 and appropriate throughout the examination. Cardiopulmonary assessment is negative for acute distress and she exhibits normal effort.  Her skin in the treatment field looks excellent today with only very faint residual hyperpigmentation but no palpable abnormalities.  Her incision at the lateral aspect of the right brow is well-healed without signs of infection and nontender to palpate.  There is alopecia of the right brow.  Lab Findings: Lab Results  Component Value Date   WBC 4.8 01/20/2017   HGB 12.9 02/03/2020   HCT 40.4 01/20/2017   MCV 91.5 01/20/2017   PLT 202 01/20/2017     Radiographic Findings: No results found.  Impression/Plan: 1.  54 y.o. female with recurrent, low grade B-cell non-Hodgkin's lymphoma involving right frontal region at the lateral third of the right brow. She appears to have recovered well from the effects of her recent radiotherapy and is currently without complaints.  We discussed that while we are happy to continue to participate in her care if clinically indicated, at this point, we will plan to see her back on an as-needed basis.  She will continue in routine follow-up under the care and direction of Dr. Benay Spice but knows that she is welcome to call us at anytime with any questions or concerns related to her prior radiotherapy.    Nicholos Johns, PA-C

## 2020-05-25 ENCOUNTER — Ambulatory Visit: Payer: BC Managed Care – PPO | Admitting: Oncology

## 2020-08-20 ENCOUNTER — Inpatient Hospital Stay (HOSPITAL_BASED_OUTPATIENT_CLINIC_OR_DEPARTMENT_OTHER): Payer: BC Managed Care – PPO | Admitting: Nurse Practitioner

## 2020-08-20 ENCOUNTER — Inpatient Hospital Stay: Payer: BC Managed Care – PPO | Attending: Nurse Practitioner

## 2020-08-20 ENCOUNTER — Encounter: Payer: Self-pay | Admitting: Nurse Practitioner

## 2020-08-20 ENCOUNTER — Other Ambulatory Visit: Payer: Self-pay

## 2020-08-20 VITALS — BP 130/77 | HR 69 | Temp 98.1°F | Resp 18 | Ht 66.0 in | Wt 156.4 lb

## 2020-08-20 DIAGNOSIS — Z79899 Other long term (current) drug therapy: Secondary | ICD-10-CM | POA: Insufficient documentation

## 2020-08-20 DIAGNOSIS — Z23 Encounter for immunization: Secondary | ICD-10-CM | POA: Insufficient documentation

## 2020-08-20 DIAGNOSIS — C8516 Unspecified B-cell lymphoma, intrapelvic lymph nodes: Secondary | ICD-10-CM | POA: Diagnosis not present

## 2020-08-20 DIAGNOSIS — D72819 Decreased white blood cell count, unspecified: Secondary | ICD-10-CM | POA: Insufficient documentation

## 2020-08-20 DIAGNOSIS — C8510 Unspecified B-cell lymphoma, unspecified site: Secondary | ICD-10-CM | POA: Diagnosis not present

## 2020-08-20 DIAGNOSIS — R1032 Left lower quadrant pain: Secondary | ICD-10-CM | POA: Diagnosis not present

## 2020-08-20 DIAGNOSIS — R490 Dysphonia: Secondary | ICD-10-CM | POA: Diagnosis not present

## 2020-08-20 DIAGNOSIS — K5730 Diverticulosis of large intestine without perforation or abscess without bleeding: Secondary | ICD-10-CM | POA: Diagnosis not present

## 2020-08-20 DIAGNOSIS — Z923 Personal history of irradiation: Secondary | ICD-10-CM | POA: Insufficient documentation

## 2020-08-20 DIAGNOSIS — K449 Diaphragmatic hernia without obstruction or gangrene: Secondary | ICD-10-CM | POA: Diagnosis not present

## 2020-08-20 LAB — CMP (CANCER CENTER ONLY)
ALT: 90 U/L — ABNORMAL HIGH (ref 0–44)
AST: 56 U/L — ABNORMAL HIGH (ref 15–41)
Albumin: 4.2 g/dL (ref 3.5–5.0)
Alkaline Phosphatase: 42 U/L (ref 38–126)
Anion gap: 6 (ref 5–15)
BUN: 13 mg/dL (ref 6–20)
CO2: 31 mmol/L (ref 22–32)
Calcium: 10.2 mg/dL (ref 8.9–10.3)
Chloride: 105 mmol/L (ref 98–111)
Creatinine: 0.94 mg/dL (ref 0.44–1.00)
GFR, Estimated: >60 mL/min (ref >60)
Glucose, Bld: 97 mg/dL (ref 70–99)
Potassium: 4.2 mmol/L (ref 3.5–5.1)
Sodium: 142 mmol/L (ref 135–145)
Total Bilirubin: 1.2 mg/dL (ref 0.3–1.2)
Total Protein: 7.4 g/dL (ref 6.5–8.1)

## 2020-08-20 LAB — CBC WITH DIFFERENTIAL (CANCER CENTER ONLY)
Abs Immature Granulocytes: 0.01 10*3/uL (ref 0.00–0.07)
Basophils Absolute: 0 10*3/uL (ref 0.0–0.1)
Basophils Relative: 1 %
Eosinophils Absolute: 0.2 10*3/uL (ref 0.0–0.5)
Eosinophils Relative: 3 %
HCT: 40.6 % (ref 36.0–46.0)
Hemoglobin: 13.4 g/dL (ref 12.0–15.0)
Immature Granulocytes: 0 %
Lymphocytes Relative: 28 %
Lymphs Abs: 1.8 10*3/uL (ref 0.7–4.0)
MCH: 30.5 pg (ref 26.0–34.0)
MCHC: 33 g/dL (ref 30.0–36.0)
MCV: 92.5 fL (ref 80.0–100.0)
Monocytes Absolute: 0.5 10*3/uL (ref 0.1–1.0)
Monocytes Relative: 7 %
Neutro Abs: 4 10*3/uL (ref 1.7–7.7)
Neutrophils Relative %: 61 %
Platelet Count: 204 10*3/uL (ref 150–400)
RBC: 4.39 MIL/uL (ref 3.87–5.11)
RDW: 12.3 % (ref 11.5–15.5)
WBC Count: 6.5 10*3/uL (ref 4.0–10.5)
nRBC: 0 % (ref 0.0–0.2)

## 2020-08-20 LAB — LACTATE DEHYDROGENASE: LDH: 241 U/L — ABNORMAL HIGH (ref 98–192)

## 2020-08-20 MED ORDER — INFLUENZA VAC SPLIT QUAD 0.5 ML IM SUSY
PREFILLED_SYRINGE | INTRAMUSCULAR | Status: AC
  Filled 2020-08-20: qty 0.5

## 2020-08-20 MED ORDER — INFLUENZA VAC SPLIT QUAD 0.5 ML IM SUSY
0.5000 mL | PREFILLED_SYRINGE | Freq: Once | INTRAMUSCULAR | Status: AC
  Administered 2020-08-20: 0.5 mL via INTRAMUSCULAR

## 2020-08-20 NOTE — Progress Notes (Signed)
  [facility] OFF[facility] PROGRESS NOTE   Diagnosis: Non-Hodgkin's lymphoma  INTERVAL HISTORY:   K[facility] Irwin returns as scheduled.  She denies fever/sweats.  She has a good appetite.  No weight loss.  For the past approximate 6 weeks she has noted pain in the left groin when she crosses the left leg over the right leg.  She has hoarseness related to allergies.  Objective:  Vital signs in last 24 hours:  Blood pressure 130/77, pulse 69, temperature 98.1 F (36.7 C), temperature source Tympanic, resp. rate 18, height 5\' 6"  (1.676 m), weight 156 lb 6.4 oz (70.9 kg), SpO2 98 %.    HEENT: Neck without mass.  Partial loss of the right eyebrow.  No palpable mass. Lymphatics: No palpable cervical, supraclavicular, axillary, inguinal or femoral nodes. Resp: Lungs clear bilaterally. Cardio: Regular rate and rhythm. GI: Abdomen soft and nontender.  No hepatosplenomegaly. Vascular: No leg edema.    Lab Results:  Lab Results  Component Value Date   WBC 6.5 08/20/2020   HGB 13.4 08/20/2020   HCT 40.6 08/20/2020   MCV 92.5 08/20/2020   PLT 204 08/20/2020   NEUTROABS 4.0 08/20/2020    Imaging:  No results found.  Medications: I have reviewed the patient's current medications.  Assessment/Plan: 1. High-grade B-cell non-Hodgkin's lymphoma presenting with pelvic pain found to have bulky pelvic lymphadenopathy January 2010. Status post 6 cycles of CHOP/Rituxan with a complete response. 2. 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 3. Chronic leukopenia. 4. Subcutaneous lesion right sternoclavicular notch-excisional biopsy 03/10/2014 confirmed a high-grade follicular lymphoma (grade 3A)  PET scan 03/22/2014. No clear evidence of active lymphoma. Extensive brown fat within the lower neck.   Restaging CT  scans 06/23/2014-negative for lymphoma 5.Firm fullness superior to the right orbit February 2021  CT maxillofacial 10/28/2019-subperiosteal soft tissue thickening in the right frontal region, just above the superior orbital rim measuring  Excision right forehead lesion 02/03/2020-atypical lymphoid infiltrate consistent with non-Hodgkins B-cell lymphoma, low-grade process favored, unclear whether this represents recurrence of follicular lymphoma or a new disease process, possibly marginal zone lymphoma  CTs neck/chest/abdomen/pelvis 02/21/2020-no lymphadenopathy or other findings of recurrent lymphoma in the chest, abdomen or pelvis; no evidence of disease in the neck  Radiation to right frontal scalp/skull 03/08/2020-03/29/2020, 30 Gy in 15 fractions  Disposition: Katelyn Irwin appears stable.  No palpable adenopathy on examination.  She is having pain in the left groin region.  We are referring her for restaging CT scans neck, chest, abdomen, pelvis.  If CTs are negative we will see her in 4 months.  She will receive the influenza vaccine today.  Plan reviewed with Dr. Benay Spice.  Ned Card ANP/GNP-BC   08/20/2020  11:21 AM

## 2020-08-21 ENCOUNTER — Encounter: Payer: Self-pay | Admitting: Nurse Practitioner

## 2020-08-22 ENCOUNTER — Encounter: Payer: Self-pay | Admitting: *Deleted

## 2020-09-04 ENCOUNTER — Encounter (HOSPITAL_COMMUNITY): Payer: Self-pay

## 2020-09-04 ENCOUNTER — Ambulatory Visit (HOSPITAL_COMMUNITY)
Admission: RE | Admit: 2020-09-04 | Discharge: 2020-09-04 | Disposition: A | Discharge status: Home / Self Care | Payer: BC Managed Care – PPO | Source: Ambulatory Visit | Attending: Nurse Practitioner | Admitting: Nurse Practitioner

## 2020-09-04 ENCOUNTER — Other Ambulatory Visit: Payer: Self-pay

## 2020-09-04 DIAGNOSIS — C8510 Unspecified B-cell lymphoma, unspecified site: Secondary | ICD-10-CM | POA: Insufficient documentation

## 2020-09-04 DIAGNOSIS — K7689 Other specified diseases of liver: Secondary | ICD-10-CM | POA: Diagnosis not present

## 2020-09-04 DIAGNOSIS — K5730 Diverticulosis of large intestine without perforation or abscess without bleeding: Secondary | ICD-10-CM | POA: Diagnosis not present

## 2020-09-04 DIAGNOSIS — C8590 Non-Hodgkin lymphoma, unspecified, unspecified site: Secondary | ICD-10-CM | POA: Diagnosis not present

## 2020-09-04 DIAGNOSIS — K449 Diaphragmatic hernia without obstruction or gangrene: Secondary | ICD-10-CM | POA: Diagnosis not present

## 2020-09-04 MED ORDER — IOHEXOL 300 MG/ML  SOLN
100.0000 mL | Freq: Once | INTRAMUSCULAR | Status: AC | PRN
  Administered 2020-09-04: 100 mL via INTRAVENOUS

## 2020-09-04 MED ORDER — IOHEXOL 9 MG/ML PO SOLN
1000.0000 mL | ORAL | Status: AC
  Administered 2020-09-04: 1000 mL via ORAL

## 2020-09-05 ENCOUNTER — Other Ambulatory Visit: Payer: Self-pay

## 2020-09-05 ENCOUNTER — Inpatient Hospital Stay (HOSPITAL_BASED_OUTPATIENT_CLINIC_OR_DEPARTMENT_OTHER): Payer: BC Managed Care – PPO | Admitting: Oncology

## 2020-09-05 ENCOUNTER — Inpatient Hospital Stay: Payer: BC Managed Care – PPO

## 2020-09-05 VITALS — BP 113/79 | HR 63 | Temp 97.8°F | Resp 16 | Ht 66.0 in | Wt 157.3 lb

## 2020-09-05 DIAGNOSIS — Z23 Encounter for immunization: Secondary | ICD-10-CM | POA: Diagnosis not present

## 2020-09-05 DIAGNOSIS — K449 Diaphragmatic hernia without obstruction or gangrene: Secondary | ICD-10-CM | POA: Diagnosis not present

## 2020-09-05 DIAGNOSIS — D72819 Decreased white blood cell count, unspecified: Secondary | ICD-10-CM | POA: Diagnosis not present

## 2020-09-05 DIAGNOSIS — K5730 Diverticulosis of large intestine without perforation or abscess without bleeding: Secondary | ICD-10-CM | POA: Diagnosis not present

## 2020-09-05 DIAGNOSIS — R1032 Left lower quadrant pain: Secondary | ICD-10-CM | POA: Diagnosis not present

## 2020-09-05 DIAGNOSIS — C8510 Unspecified B-cell lymphoma, unspecified site: Secondary | ICD-10-CM | POA: Diagnosis not present

## 2020-09-05 DIAGNOSIS — R490 Dysphonia: Secondary | ICD-10-CM | POA: Diagnosis not present

## 2020-09-05 DIAGNOSIS — C8516 Unspecified B-cell lymphoma, intrapelvic lymph nodes: Secondary | ICD-10-CM | POA: Diagnosis not present

## 2020-09-05 DIAGNOSIS — Z923 Personal history of irradiation: Secondary | ICD-10-CM | POA: Diagnosis not present

## 2020-09-05 DIAGNOSIS — Z79899 Other long term (current) drug therapy: Secondary | ICD-10-CM | POA: Diagnosis not present

## 2020-09-05 NOTE — Progress Notes (Signed)
[facility] PROGRESS NOTE   Diagnosis: Non-Hodgkin's lymphoma  INTERVAL HISTORY:   K[facility] Irwin returns as scheduled.  She feels well.  She continues to have discomfort at the left groin when she crosses her legs.  No palpable abnormality.  No pain at other times.  She reports a good appetite and energy level.  No fever or night sweats.  No palpable lymph nodes.  Objective:  Vital signs in last 24 hours:  Blood pressure 113/79, pulse 63, temperature 97.8 F (36.6 C), temperature source Tympanic, resp. rate 16, height 5\' 6"  (1.676 m), weight 157 lb 4.8 oz (71.4 kg), SpO2 100 %.    HEENT: Neck without mass Lymphatics: No cervical, supraclavicular, axillary, or inguinal nodes. Resp: Lungs clear bilaterally Cardio: Regular rate and rhythm GI: No mass, nontender, no hepatosplenomegaly Vascular: No leg edema Musculoskeletal: No pain with motion at the left hip.   Lab Results:  Lab Results  Component Value Date   WBC 6.5 08/20/2020   HGB 13.4 08/20/2020   HCT 40.6 08/20/2020   MCV 92.5 08/20/2020   PLT 204 08/20/2020   NEUTROABS 4.0 08/20/2020    CMP  Lab Results  Component Value Date   NA 142 08/20/2020   K 4.2 08/20/2020   CL 105 08/20/2020   CO2 31 08/20/2020   GLUCOSE 97 08/20/2020   BUN 13 08/20/2020   CREATININE 0.94 08/20/2020   CALCIUM 10.2 08/20/2020   PROT 7.4 08/20/2020   ALBUMIN 4.2 08/20/2020   AST 56 (H) 08/20/2020   ALT 90 (H) 08/20/2020   ALKPHOS 42 08/20/2020   BILITOT 1.2 08/20/2020   GFRNONAA >60 08/20/2020   GFRAA >60 02/03/2020     Imaging:  CT Soft Tissue Neck W Contrast  Result Date: 09/04/2020 CLINICAL DATA:  Restaging of non-Hodgkin's lymphoma. EXAM: CT NECK WITH CONTRAST TECHNIQUE: Multidetector CT imaging of the neck was performed using the standard protocol following the bolus administration of intravenous contrast. CONTRAST:  118mL OMNIPAQUE IOHEXOL 300 MG/ML  SOLN COMPARISON:  CT neck with contrast  02/21/2020. FINDINGS: Pharynx and larynx: No focal mucosal or submucosal lesions are present. Nasopharynx is clear. Soft palate and tongue base are within normal limits. Vallecula and epiglottis are within normal limits. Aryepiglottic folds and piriform sinuses are clear. Vocal cords are midline and symmetric. Trachea is clear. Salivary glands: The submandibular and parotid glands and ducts are within normal limits. Thyroid: Normal Lymph nodes: No significant cervical adenopathy is present. Subcentimeter nodes bilaterally are stable. Vascular: No significant vascular disease is present. Limited intracranial: Within normal limits. Visualized orbits: The globes and orbits are within normal limits. Mastoids and visualized paranasal sinuses: The paranasal sinuses and mastoid air cells are clear. Skeleton: Vertebral body heights and alignment are normal. No focal lytic or blastic lesions are present. Upper chest: The lung apices are clear. Thoracic inlet is within normal limits. IMPRESSION: Stable appearance of the neck. No significant cervical adenopathy. Electronically Signed   By: San Morelle M.D.   On: 09/04/2020 15:38   CT Chest W Contrast  Result Date: 09/04/2020 CLINICAL DATA:  Restaging non-Hodgkin's lymphoma. Chemotherapy and radiation therapy completed. EXAM: CT CHEST, ABDOMEN, AND PELVIS WITH CONTRAST TECHNIQUE: Multidetector CT imaging of the chest, abdomen and pelvis was performed following the standard protocol during bolus administration of intravenous contrast. CONTRAST:  128mL OMNIPAQUE IOHEXOL 300 MG/ML  SOLN COMPARISON:  Prior CT 02/21/2020 and 06/23/2014 FINDINGS: CT CHEST FINDINGS Cardiovascular: No significant vascular findings. The heart size is normal. There  is no pericardial effusion. Mediastinum/Nodes: There are no enlarged mediastinal, hilar or axillary lymph nodes. There is a stable small hiatal hernia with mild distal esophageal wall thickening. The thyroid gland and trachea  demonstrate no significant findings. Lungs/Pleura: No pleural effusion or pneumothorax. Stable mild asymmetric scarring posteriorly at the left lung apex, likely related to previous radiation therapy. The lungs are otherwise clear. No suspicious pulmonary nodules. Musculoskeletal/Chest wall: No chest wall mass or suspicious osseous findings. CT ABDOMEN AND PELVIS FINDINGS Hepatobiliary: The liver is normal in density without suspicious focal abnormality. Stable tiny low-density lesion in the left hepatic lobe on image 54/2, potentially focal fat. Possible small gallstone. No gallbladder wall thickening or biliary dilatation. Pancreas: Unremarkable. No pancreatic ductal dilatation or surrounding inflammatory changes. Spleen: Normal in size without focal abnormality. Adrenals/Urinary Tract: Both adrenal glands appear normal. The kidneys appear normal without evidence of urinary tract calculus, suspicious lesion or hydronephrosis. No bladder abnormalities are seen. Stomach/Bowel: No evidence of bowel wall thickening, distention or surrounding inflammatory change. The appendix appears normal. Mild sigmoid colon diverticulosis. Vascular/Lymphatic: There are no enlarged abdominal or pelvic lymph nodes. No significant vascular findings. Reproductive: The uterus appears unremarkable.  No adnexal mass. Other: Intact abdominal wall.  No ascites or peritoneal nodularity. Musculoskeletal: No acute or aggressive osseous lesions. Bilateral L5 pars defects without significant anterolisthesis or foraminal narrowing at L5-S1. IMPRESSION: 1. Stable CTS of the chest, abdomen and pelvis. No evidence of local recurrence or metastatic disease. 2. Stable small hiatal hernia with mild distal esophageal wall thickening. 3. Bilateral L5 pars defects. Electronically Signed   By: Richardean Sale M.D.   On: 09/04/2020 17:01   CT Abdomen Pelvis W Contrast  Result Date: 09/04/2020 CLINICAL DATA:  Restaging non-Hodgkin's lymphoma.  Chemotherapy and radiation therapy completed. EXAM: CT CHEST, ABDOMEN, AND PELVIS WITH CONTRAST TECHNIQUE: Multidetector CT imaging of the chest, abdomen and pelvis was performed following the standard protocol during bolus administration of intravenous contrast. CONTRAST:  12mL OMNIPAQUE IOHEXOL 300 MG/ML  SOLN COMPARISON:  Prior CT 02/21/2020 and 06/23/2014 FINDINGS: CT CHEST FINDINGS Cardiovascular: No significant vascular findings. The heart size is normal. There is no pericardial effusion. Mediastinum/Nodes: There are no enlarged mediastinal, hilar or axillary lymph nodes. There is a stable small hiatal hernia with mild distal esophageal wall thickening. The thyroid gland and trachea demonstrate no significant findings. Lungs/Pleura: No pleural effusion or pneumothorax. Stable mild asymmetric scarring posteriorly at the left lung apex, likely related to previous radiation therapy. The lungs are otherwise clear. No suspicious pulmonary nodules. Musculoskeletal/Chest wall: No chest wall mass or suspicious osseous findings. CT ABDOMEN AND PELVIS FINDINGS Hepatobiliary: The liver is normal in density without suspicious focal abnormality. Stable tiny low-density lesion in the left hepatic lobe on image 54/2, potentially focal fat. Possible small gallstone. No gallbladder wall thickening or biliary dilatation. Pancreas: Unremarkable. No pancreatic ductal dilatation or surrounding inflammatory changes. Spleen: Normal in size without focal abnormality. Adrenals/Urinary Tract: Both adrenal glands appear normal. The kidneys appear normal without evidence of urinary tract calculus, suspicious lesion or hydronephrosis. No bladder abnormalities are seen. Stomach/Bowel: No evidence of bowel wall thickening, distention or surrounding inflammatory change. The appendix appears normal. Mild sigmoid colon diverticulosis. Vascular/Lymphatic: There are no enlarged abdominal or pelvic lymph nodes. No significant vascular findings.  Reproductive: The uterus appears unremarkable.  No adnexal mass. Other: Intact abdominal wall.  No ascites or peritoneal nodularity. Musculoskeletal: No acute or aggressive osseous lesions. Bilateral L5 pars defects without significant anterolisthesis or foraminal narrowing  at L5-S1. IMPRESSION: 1. Stable CTS of the chest, abdomen and pelvis. No evidence of local recurrence or metastatic disease. 2. Stable small hiatal hernia with mild distal esophageal wall thickening. 3. Bilateral L5 pars defects. Electronically Signed   By: Richardean Sale M.D.   On: 09/04/2020 17:01    Medications: I have reviewed the patient's current medications.   Assessment/Plan: 1. High-grade B-cell non-Hodgkin's lymphoma presenting with pelvic pain found to have bulky pelvic lymphadenopathy January 2010. Status post 6 cycles of CHOP/Rituxan with a complete response. 2. 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 3. Chronic leukopenia. 4. Subcutaneous lesion right sternoclavicular notch-excisional biopsy 03/10/2014 confirmed a high-grade follicular lymphoma (grade 3A)  PET scan 03/22/2014. No clear evidence of active lymphoma. Extensive brown fat within the lower neck.   Restaging CT scans 06/23/2014-negative for lymphoma 5.Firm fullness superior to the right orbit February 2021  CT maxillofacial 10/28/2019-subperiosteal soft tissue thickening in the right frontal region, just above the superior orbital rim measuring  Excision right forehead lesion 02/03/2020-atypical lymphoid infiltrate consistent with non-Hodgkins B-cell lymphoma, low-grade process favored, unclear whether this represents recurrence of follicular lymphoma or a new disease process, possibly marginal zone lymphoma  CTs neck/chest/abdomen/pelvis 02/21/2020-no lymphadenopathy or other  findings of recurrent lymphoma in the chest, abdomen or pelvis; no evidence of disease in the neck  Radiation to right frontal scalp/skull 03/08/2020-03/29/2020, 30 Gy in 15 fractions  CTs neck/check/abdomen/pelvis 09/04/2020-stable appearance of the neck, no significant cervical adenopathy, no evidence of recurrent lymphoma    Disposition: [facility]n Irwin remains in remission from non-Hodgkin's lymphoma.  The restaging CTs reveal no evidence of recurrent lymphoma.  The etiology of the left groin discomfort is unclear.  No abnormality in this area on exam today.  She will call for increased pain or new symptoms.  The LDH was mildly elevated on 08/20/2020.  This is a nonspecific finding.  We will repeat an LDH when she returns in 6 months.  [facility] will return for an office and lab visit in 6 months.  She will contact us in the interim as needed.  She is scheduling a COVID-19 booster vaccine.  [name], MD  09/05/2020  12:54 PM

## 2020-12-21 ENCOUNTER — Other Ambulatory Visit: Payer: BC Managed Care – PPO

## 2020-12-21 ENCOUNTER — Ambulatory Visit: Payer: BC Managed Care – PPO | Admitting: Oncology

## 2021-03-06 ENCOUNTER — Other Ambulatory Visit: Payer: BC Managed Care – PPO

## 2021-03-06 ENCOUNTER — Other Ambulatory Visit: Payer: Self-pay

## 2021-03-06 ENCOUNTER — Inpatient Hospital Stay: Payer: BC Managed Care – PPO

## 2021-03-06 ENCOUNTER — Inpatient Hospital Stay: Payer: BC Managed Care – PPO | Attending: Nurse Practitioner | Admitting: Nurse Practitioner

## 2021-03-06 ENCOUNTER — Encounter: Payer: Self-pay | Admitting: Nurse Practitioner

## 2021-03-06 VITALS — BP 114/73 | HR 57 | Temp 97.8°F | Resp 20 | Ht 66.0 in | Wt 161.6 lb

## 2021-03-06 DIAGNOSIS — Z79899 Other long term (current) drug therapy: Secondary | ICD-10-CM | POA: Diagnosis not present

## 2021-03-06 DIAGNOSIS — C8511 Unspecified B-cell lymphoma, lymph nodes of head, face, and neck: Secondary | ICD-10-CM | POA: Diagnosis not present

## 2021-03-06 DIAGNOSIS — R7402 Elevation of levels of lactic acid dehydrogenase (LDH): Secondary | ICD-10-CM | POA: Diagnosis not present

## 2021-03-06 DIAGNOSIS — Z923 Personal history of irradiation: Secondary | ICD-10-CM | POA: Diagnosis not present

## 2021-03-06 DIAGNOSIS — D72819 Decreased white blood cell count, unspecified: Secondary | ICD-10-CM | POA: Diagnosis not present

## 2021-03-06 DIAGNOSIS — C8510 Unspecified B-cell lymphoma, unspecified site: Secondary | ICD-10-CM | POA: Diagnosis not present

## 2021-03-06 LAB — CBC WITH DIFFERENTIAL (CANCER CENTER ONLY)
Abs Immature Granulocytes: 0.01 10*3/uL (ref 0.00–0.07)
Basophils Absolute: 0 10*3/uL (ref 0.0–0.1)
Basophils Relative: 1 %
Eosinophils Absolute: 0.8 10*3/uL — ABNORMAL HIGH (ref 0.0–0.5)
Eosinophils Relative: 16 %
HCT: 38.4 % (ref 36.0–46.0)
Hemoglobin: 12.8 g/dL (ref 12.0–15.0)
Immature Granulocytes: 0 %
Lymphocytes Relative: 40 %
Lymphs Abs: 2 10*3/uL (ref 0.7–4.0)
MCH: 30.9 pg (ref 26.0–34.0)
MCHC: 33.3 g/dL (ref 30.0–36.0)
MCV: 92.8 fL (ref 80.0–100.0)
Monocytes Absolute: 0.3 10*3/uL (ref 0.1–1.0)
Monocytes Relative: 5 %
Neutro Abs: 1.9 10*3/uL (ref 1.7–7.7)
Neutrophils Relative %: 38 %
Platelet Count: 191 10*3/uL (ref 150–400)
RBC: 4.14 MIL/uL (ref 3.87–5.11)
RDW: 12.6 % (ref 11.5–15.5)
WBC Count: 5 10*3/uL (ref 4.0–10.5)
nRBC: 0 % (ref 0.0–0.2)

## 2021-03-06 LAB — CMP (CANCER CENTER ONLY)
ALT: 19 U/L (ref 0–44)
AST: 15 U/L (ref 15–41)
Albumin: 4.3 g/dL (ref 3.5–5.0)
Alkaline Phosphatase: 23 U/L — ABNORMAL LOW (ref 38–126)
Anion gap: 8 (ref 5–15)
BUN: 16 mg/dL (ref 6–20)
CO2: 28 mmol/L (ref 22–32)
Calcium: 9.6 mg/dL (ref 8.9–10.3)
Chloride: 104 mmol/L (ref 98–111)
Creatinine: 0.83 mg/dL (ref 0.44–1.00)
GFR, Estimated: >60 mL/min (ref >60)
Glucose, Bld: 104 mg/dL — ABNORMAL HIGH (ref 70–99)
Potassium: 4.2 mmol/L (ref 3.5–5.1)
Sodium: 140 mmol/L (ref 135–145)
Total Bilirubin: 1 mg/dL (ref 0.3–1.2)
Total Protein: 6.2 g/dL — ABNORMAL LOW (ref 6.5–8.1)

## 2021-03-06 LAB — LACTATE DEHYDROGENASE: LDH: 164 U/L (ref 98–192)

## 2021-03-06 NOTE — Progress Notes (Signed)
  [place] OFFICE PROGRESS NOTE   Diagnosis: Non-Hodgkin's lymphoma  INTERVAL HISTORY:   Katelyn Irwin returns as scheduled.  She feels well.  No fevers or sweats.  She has a good appetite.  No weight loss.  She has not noticed enlarged lymph nodes.  She denies pain.  She reports her husband was recently diagnosed with Parkinson's.  Objective:  Vital signs in last 24 hours:  Blood pressure 114/73, pulse (!) 57, temperature 97.8 F (36.6 C), temperature source Oral, resp. rate 20, height 5\' 6"  (1.676 m), weight 161 lb 9.6 oz (73.3 kg), SpO2 100 %.    HEENT: Neck without mass. Lymphatics: No palpable cervical, supraclavicular, axillary or inguinal lymph nodes. Resp: Lungs clear bilaterally. Cardio: Regular rate and rhythm. GI: Abdomen soft and nontender.  No mass.  No hepatosplenomegaly. Vascular: No leg edema.    Lab Results:  Lab Results  Component Value Date   WBC 5.0 03/06/2021   HGB 12.8 03/06/2021   HCT 38.4 03/06/2021   MCV 92.8 03/06/2021   PLT 191 03/06/2021   NEUTROABS 1.9 03/06/2021    Imaging:  No results found.  Medications: I have reviewed the patient's current medications.  Assessment/Plan: High-grade B-cell non-Hodgkin's lymphoma presenting with pelvic pain found to have bulky pelvic lymphadenopathy January 2010. Status post 6 cycles of CHOP/Rituxan with a complete response. 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 Chronic leukopenia. 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 No clear evidence of active lymphoma. Extensive brown fat within the lower neck.   Restaging CT scans 06/23/2014-negative for lymphoma 5.  Firm fullness superior to the right orbit  February 2021 CT maxillofacial 10/28/2019-subperiosteal soft tissue thickening in the right frontal region, just above the superior orbital rim measuring Excision right forehead lesion 02/03/2020-atypical lymphoid infiltrate consistent with non-Hodgkins B-cell lymphoma, low-grade process favored, unclear whether this represents recurrence of follicular lymphoma or a new disease process, possibly marginal zone lymphoma CTs neck/chest/abdomen/pelvis 02/21/2020-no lymphadenopathy or other findings of recurrent lymphoma in the chest, abdomen or pelvis; no evidence of disease in the neck Radiation to right frontal scalp/skull 03/08/2020-03/29/2020, 30 Gy in 15 fractions CTs neck/check/abdomen/pelvis 09/04/2020-stable appearance of the neck, no significant cervical adenopathy, no evidence of recurrent lymphoma  Disposition: Ms. Shelvin remains in clinical remission from non-Hodgkin's lymphoma.  The LDH was mildly elevated on 08/20/2020.  We will follow-up on the value from today.  She will return for lab and follow-up in 6 months.  She will contact the office in the interim with any problems.  Plan reviewed with Dr. Benay Spice.    Ned Card ANP/GNP-BC   03/06/2021  9:59 AM

## 2021-03-07 ENCOUNTER — Other Ambulatory Visit: Payer: Self-pay | Admitting: Nurse Practitioner

## 2021-03-07 DIAGNOSIS — C8510 Unspecified B-cell lymphoma, unspecified site: Secondary | ICD-10-CM

## 2021-07-23 ENCOUNTER — Encounter: Payer: Self-pay | Admitting: Dermatology

## 2021-07-23 ENCOUNTER — Ambulatory Visit (INDEPENDENT_AMBULATORY_CARE_PROVIDER_SITE_OTHER): Payer: BC Managed Care – PPO | Admitting: Dermatology

## 2021-07-23 ENCOUNTER — Other Ambulatory Visit: Payer: Self-pay

## 2021-07-23 DIAGNOSIS — D1801 Hemangioma of skin and subcutaneous tissue: Secondary | ICD-10-CM

## 2021-07-23 DIAGNOSIS — D239 Other benign neoplasm of skin, unspecified: Secondary | ICD-10-CM

## 2021-07-23 DIAGNOSIS — Q825 Congenital non-neoplastic nevus: Secondary | ICD-10-CM

## 2021-07-23 DIAGNOSIS — D2372 Other benign neoplasm of skin of left lower limb, including hip: Secondary | ICD-10-CM | POA: Diagnosis not present

## 2021-07-23 DIAGNOSIS — Z1283 Encounter for screening for malignant neoplasm of skin: Secondary | ICD-10-CM

## 2021-07-23 DIAGNOSIS — L918 Other hypertrophic disorders of the skin: Secondary | ICD-10-CM | POA: Diagnosis not present

## 2021-07-23 NOTE — Patient Instructions (Signed)
Cherry Angioma A cherry angioma is a harmless growth on the skin. It is made up of blood vessels. Cherry angiomas can appear anywhere on the body, but they usually appear on the trunk and arms. What are the causes? The cause of this condition is not known, but it seems to be related to advancing age. What increases the risk? You are more likely to develop this condition if you: Are over the age of 4. Have a family member with this condition. What are the signs or symptoms?  Symptoms of this condition include harmless growths that are: Smooth, round, and red or purplish-red. As small as the tip of a pin or as big as a pencil eraser. How is this diagnosed? This condition is diagnosed with a skin exam. Rarely, a piece of the cherry angioma may be removed for testing if it is not clear that the growth is a cherry angioma. How is this treated? Treatment is not needed for this condition. If you do not like the way a cherry angioma looks, you may have it removed. Removal methods include: A method where heat is used to burn the cherry angioma off the skin (electrocautery). A method where the cherry angioma is frozen (cryosurgery). This causes it to eventually fall off the skin. A method where a laser is used to destroy the red blood cells and blood vessels in the angioma (laser therapy). A minor surgical procedure. A scalpel is used to remove the cherry angioma off the skin. A cherry angioma may come back after it has been removed. Follow these instructions at home: If you have a cherry angioma removed, keep the area clean and follow any other care instructions as told by your health care provider. Take over-the-counter and prescription medicines only as told by your health care provider. Keep all follow-up visits as told by your health care provider. This is important. Summary A cherry angioma is a harmless growth on the skin that is made up of blood vessels. Treatment is not needed for this  condition. If you do not like the way a cherry angioma looks, you may have it removed. If you have a cherry angioma removed, follow any care instructions as told by your health care provider. This information is not intended to replace advice given to you by your health care provider. Make sure you discuss any questions you have with your health care provider. Document Revised: 03/23/2018 Document Reviewed: 03/23/2018 Elsevier Patient Education  [place].

## 2021-08-11 ENCOUNTER — Encounter: Payer: Self-pay | Admitting: Dermatology

## 2021-08-11 NOTE — Progress Notes (Signed)
   New Patient   Subjective  Katelyn Irwin is a 54 y.o. female who presents for the following: New Patient (Initial Visit) (Patient here today for skin check no concerns. Per patient this is her first skin check, no personal history or family history of atypical moles, melanoma or non mole skin cancer. ).  General skin examination, history of radiation plus chemotherapy for lymphoma Location:  Duration:  Quality:  Associated Signs/Symptoms: Modifying Factors:  Severity:  Timing: Context:    The following portions of the chart were reviewed this encounter and updated as appropriate:  Tobacco  Allergies  Meds  Problems  Med Hx  Surg Hx  Fam Hx      Objective  Well appearing patient in no apparent distress; mood and affect are within normal limits. Head to Toe No atypical nevi or signs of NMSC noted at the time of the visit.   Right Axilla Fleshy, skin-colored  pedunculated papules.    Left Hip, Left Hip (side) - Posterior Firm 5 mm pink dermal papules  Right Submandibular Area 1 cm monochrome brown macule, no dermoscopic atypia  Torso - Posterior (Back) Multiple 1 to 2 mm dermal vascular red lesions.     A full examination was performed including scalp, head, eyes, ears, nose, lips, neck, chest, axillae, abdomen, back, buttocks, bilateral upper extremities, bilateral lower extremities, hands, feet, fingers, toes, fingernails, and toenails. All findings within normal limits unless otherwise noted below.  Areas beneath undergarments not fully examined.   Assessment & Plan  Skin exam for malignant neoplasm Head to Toe  Yearly skin check.  Skin tag Right Axilla  Benign okay to leave unless patient would like removed.   Dermatofibroma (2) Left Hip (side) - Posterior; Left Hip  Benign okay to leave if stable  Congenital nevus Right Submandibular Area  Recheck as needed change  Hemangioma of skin Torso - Posterior (Back)  Benign okay to leave.

## 2021-08-16 DIAGNOSIS — Z01419 Encounter for gynecological examination (general) (routine) without abnormal findings: Secondary | ICD-10-CM | POA: Diagnosis not present

## 2021-08-16 DIAGNOSIS — Z1231 Encounter for screening mammogram for malignant neoplasm of breast: Secondary | ICD-10-CM | POA: Diagnosis not present

## 2021-08-16 DIAGNOSIS — Z124 Encounter for screening for malignant neoplasm of cervix: Secondary | ICD-10-CM | POA: Diagnosis not present

## 2021-09-04 DIAGNOSIS — H43812 Vitreous degeneration, left eye: Secondary | ICD-10-CM | POA: Diagnosis not present

## 2021-09-05 ENCOUNTER — Other Ambulatory Visit: Payer: Self-pay

## 2021-09-05 ENCOUNTER — Inpatient Hospital Stay (HOSPITAL_BASED_OUTPATIENT_CLINIC_OR_DEPARTMENT_OTHER): Payer: BC Managed Care – PPO | Admitting: Oncology

## 2021-09-05 ENCOUNTER — Inpatient Hospital Stay: Payer: BC Managed Care – PPO | Attending: Oncology

## 2021-09-05 VITALS — BP 111/76 | HR 63 | Temp 97.8°F | Resp 18 | Ht 66.0 in | Wt 164.6 lb

## 2021-09-05 DIAGNOSIS — C8510 Unspecified B-cell lymphoma, unspecified site: Secondary | ICD-10-CM

## 2021-09-05 DIAGNOSIS — Z79899 Other long term (current) drug therapy: Secondary | ICD-10-CM | POA: Insufficient documentation

## 2021-09-05 DIAGNOSIS — D72819 Decreased white blood cell count, unspecified: Secondary | ICD-10-CM | POA: Insufficient documentation

## 2021-09-05 DIAGNOSIS — Z923 Personal history of irradiation: Secondary | ICD-10-CM | POA: Insufficient documentation

## 2021-09-05 DIAGNOSIS — C8516 Unspecified B-cell lymphoma, intrapelvic lymph nodes: Secondary | ICD-10-CM | POA: Insufficient documentation

## 2021-09-05 LAB — CBC WITH DIFFERENTIAL (CANCER CENTER ONLY)
Abs Immature Granulocytes: 0.01 10*3/uL (ref 0.00–0.07)
Basophils Absolute: 0 10*3/uL (ref 0.0–0.1)
Basophils Relative: 1 %
Eosinophils Absolute: 0.4 10*3/uL (ref 0.0–0.5)
Eosinophils Relative: 8 %
HCT: 39.2 % (ref 36.0–46.0)
Hemoglobin: 12.7 g/dL (ref 12.0–15.0)
Immature Granulocytes: 0 %
Lymphocytes Relative: 46 %
Lymphs Abs: 2.5 10*3/uL (ref 0.7–4.0)
MCH: 30.2 pg (ref 26.0–34.0)
MCHC: 32.4 g/dL (ref 30.0–36.0)
MCV: 93.3 fL (ref 80.0–100.0)
Monocytes Absolute: 0.3 10*3/uL (ref 0.1–1.0)
Monocytes Relative: 6 %
Neutro Abs: 2.1 10*3/uL (ref 1.7–7.7)
Neutrophils Relative %: 39 %
Platelet Count: 255 10*3/uL (ref 150–400)
RBC: 4.2 MIL/uL (ref 3.87–5.11)
RDW: 12.5 % (ref 11.5–15.5)
WBC Count: 5.4 10*3/uL (ref 4.0–10.5)
nRBC: 0 % (ref 0.0–0.2)

## 2021-09-05 LAB — LACTATE DEHYDROGENASE: LDH: 171 U/L (ref 98–192)

## 2021-09-05 LAB — CMP (CANCER CENTER ONLY)
ALT: 25 U/L (ref 0–44)
AST: 18 U/L (ref 15–41)
Albumin: 4.4 g/dL (ref 3.5–5.0)
Alkaline Phosphatase: 30 U/L — ABNORMAL LOW (ref 38–126)
Anion gap: 5 (ref 5–15)
BUN: 15 mg/dL (ref 6–20)
CO2: 33 mmol/L — ABNORMAL HIGH (ref 22–32)
Calcium: 10.1 mg/dL (ref 8.9–10.3)
Chloride: 102 mmol/L (ref 98–111)
Creatinine: 0.91 mg/dL (ref 0.44–1.00)
GFR, Estimated: >60 mL/min (ref >60)
Glucose, Bld: 76 mg/dL (ref 70–99)
Potassium: 4.3 mmol/L (ref 3.5–5.1)
Sodium: 140 mmol/L (ref 135–145)
Total Bilirubin: 1.1 mg/dL (ref 0.3–1.2)
Total Protein: 6.9 g/dL (ref 6.5–8.1)

## 2021-09-05 LAB — TSH: TSH: 2.148 u[IU]/mL (ref 0.350–4.500)

## 2021-09-05 NOTE — Progress Notes (Signed)
°  [place] OFFICE PROGRESS NOTE   Diagnosis: Non-Hodgkin's lymphoma  INTERVAL HISTORY:   [name] returns as scheduled.  She feels well.  No fever or night sweats.  No palpable lymph nodes.  Good appetite.  Objective:  Vital signs in last 24 hours:  Blood pressure 111/76, pulse 63, temperature 97.8 F (36.6 C), temperature source Oral, resp. rate 18, height 5\' 6"  (1.676 m), weight 164 lb 9.6 oz (74.7 kg), SpO2 100 %.    HEENT: No mass at the right forehead/supraorbital region Lymphatics: No cervical, supraclavicular, axillary, or inguinal nodes Resp: Lungs clear bilaterally Cardio: Regular rate and rhythm GI: No hepatosplenomegaly, no mass, nontender Vascular: No leg edema   Lab Results:  Lab Results  Component Value Date   WBC 5.4 09/05/2021   HGB 12.7 09/05/2021   HCT 39.2 09/05/2021   MCV 93.3 09/05/2021   PLT 255 09/05/2021   NEUTROABS 2.1 09/05/2021    CMP  Lab Results  Component Value Date   NA 140 09/05/2021   K 4.3 09/05/2021   CL 102 09/05/2021   CO2 33 (H) 09/05/2021   GLUCOSE 76 09/05/2021   BUN 15 09/05/2021   CREATININE 0.91 09/05/2021   CALCIUM 10.1 09/05/2021   PROT 6.9 09/05/2021   ALBUMIN 4.4 09/05/2021   AST 18 09/05/2021   ALT 25 09/05/2021   ALKPHOS 30 (L) 09/05/2021   BILITOT 1.1 09/05/2021   GFRNONAA >60 09/05/2021   GFRAA >60 02/03/2020     Medications: I have reviewed the patient's current medications.   Assessment/Plan:  High-grade B-cell non-Hodgkin's lymphoma presenting with pelvic pain found to have bulky pelvic lymphadenopathy January 2010. Status post 6 cycles of CHOP/Rituxan with a complete response. 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 Chronic leukopenia. Subcutaneous lesion right sternoclavicular notch-excisional biopsy  03/10/2014 confirmed a high-grade follicular lymphoma (grade 3A) PET scan 03/22/2014. No clear evidence of active lymphoma. Extensive brown fat within the lower neck.   Restaging CT scans 06/23/2014-negative for lymphoma 5.  Firm fullness superior to the right orbit February 2021 CT maxillofacial 10/28/2019-subperiosteal soft tissue thickening in the right frontal region, just above the superior orbital rim measuring Excision right forehead lesion 02/03/2020-atypical lymphoid infiltrate consistent with non-Hodgkins B-cell lymphoma, low-grade process favored, unclear whether this represents recurrence of follicular lymphoma or a new disease process, possibly marginal zone lymphoma CTs neck/chest/abdomen/pelvis 02/21/2020-no lymphadenopathy or other findings of recurrent lymphoma in the chest, abdomen or pelvis; no evidence of disease in the neck Radiation to right frontal scalp/skull 03/08/2020-03/29/2020, 30 Gy in 15 fractions CTs neck/check/abdomen/pelvis 09/04/2020-stable appearance of the neck, no significant cervical adenopathy, no evidence of recurrent lymphoma   Disposition: Katelyn Irwin is in clinical remission from non-Hodgkin's lymphoma.  We discussed the indication for surveillance imaging.  We decided against surveillance CTs.  She will return for an office visit in 6 months.  I encouraged her to obtain an influenza vaccine.  [name], MD  09/05/2021  3:14 PM

## 2021-12-18 DIAGNOSIS — H43812 Vitreous degeneration, left eye: Secondary | ICD-10-CM | POA: Diagnosis not present

## 2022-03-06 ENCOUNTER — Emergency Department (HOSPITAL_COMMUNITY): Payer: BC Managed Care – PPO

## 2022-03-06 ENCOUNTER — Emergency Department (HOSPITAL_COMMUNITY)
Admission: EM | Admit: 2022-03-06 | Discharge: 2022-03-06 | Disposition: A | Discharge status: Home / Self Care | Payer: BC Managed Care – PPO | Attending: Emergency Medicine | Admitting: Emergency Medicine

## 2022-03-06 ENCOUNTER — Ambulatory Visit: Payer: BC Managed Care – PPO | Admitting: Nurse Practitioner

## 2022-03-06 ENCOUNTER — Encounter (HOSPITAL_COMMUNITY): Payer: Self-pay | Admitting: Emergency Medicine

## 2022-03-06 DIAGNOSIS — R072 Precordial pain: Secondary | ICD-10-CM | POA: Insufficient documentation

## 2022-03-06 DIAGNOSIS — R079 Chest pain, unspecified: Secondary | ICD-10-CM | POA: Diagnosis not present

## 2022-03-06 LAB — CBC
HCT: 41.5 % (ref 36.0–46.0)
Hemoglobin: 13.8 g/dL (ref 12.0–15.0)
MCH: 31.2 pg (ref 26.0–34.0)
MCHC: 33.3 g/dL (ref 30.0–36.0)
MCV: 93.7 fL (ref 80.0–100.0)
Platelets: 202 10*3/uL (ref 150–400)
RBC: 4.43 MIL/uL (ref 3.87–5.11)
RDW: 12.4 % (ref 11.5–15.5)
WBC: 5.1 10*3/uL (ref 4.0–10.5)
nRBC: 0 % (ref 0.0–0.2)

## 2022-03-06 LAB — COMPREHENSIVE METABOLIC PANEL
ALT: 30 U/L (ref 0–44)
AST: 22 U/L (ref 15–41)
Albumin: 4.4 g/dL (ref 3.5–5.0)
Alkaline Phosphatase: 27 U/L — ABNORMAL LOW (ref 38–126)
Anion gap: 11 (ref 5–15)
BUN: 17 mg/dL (ref 6–20)
CO2: 27 mmol/L (ref 22–32)
Calcium: 9.7 mg/dL (ref 8.9–10.3)
Chloride: 104 mmol/L (ref 98–111)
Creatinine, Ser: 0.88 mg/dL (ref 0.44–1.00)
GFR, Estimated: >60 mL/min (ref >60)
Glucose, Bld: 99 mg/dL (ref 70–99)
Potassium: 4 mmol/L (ref 3.5–5.1)
Sodium: 142 mmol/L (ref 135–145)
Total Bilirubin: 1.9 mg/dL — ABNORMAL HIGH (ref 0.3–1.2)
Total Protein: 6.8 g/dL (ref 6.5–8.1)

## 2022-03-06 LAB — TROPONIN I (HIGH SENSITIVITY)
Troponin I (High Sensitivity): <2 ng/L (ref <18)
Troponin I (High Sensitivity): 2 ng/L (ref <18)

## 2022-03-06 NOTE — ED Provider Triage Note (Cosign Needed)
Emergency Medicine Provider Triage Evaluation Note  Katelyn Irwin , a 56 y.o. female  was evaluated in triage.  Pt complains of tightness in the chest with intermittent waves of pain starting around 11 AM.  Symptoms in the lower chest and upper abdomen.  No associated vomiting or diaphoresis.  Denies history of hypertension, high cholesterol, diabetes.  No family history of heart disease in first-degree relatives.  Review of Systems  Positive: Chest pain Negative: Vomiting  Physical Exam  BP (!) 141/81   Pulse 61   Temp 98.4 F (36.9 C) (Oral)   Resp 16   SpO2 98%  Gen:   Awake, no distress   Resp:  Normal effort  MSK:   Moves extremities without difficulty  Other:  Heart normal rate and regular rhythm, no murmur  Medical Decision Making  Medically screening exam initiated at 3:35 PM.  Appropriate orders placed.  Katelyn Irwin was informed that the remainder of the evaluation will be completed by another provider, this initial triage assessment does not replace that evaluation, and the importance of remaining in the ED until their evaluation is complete.     Carlisle Cater, PA-C 03/06/22 1537

## 2022-03-06 NOTE — ED Triage Notes (Signed)
Patient complains of a constant chest tightness in the center of her chest since 1100 today. Patient reports a similar episode two weeks ago that did not last this long. Patient denies dizziness, denies shortness of breath, denies nausea and vomiting. Patient is alert, oriented, ambulatory ,and in no apparent distress at this time.

## 2022-03-06 NOTE — ED Provider Notes (Incomplete)
Katelyn Irwin Provider Note   CSN: [id number] Arrival date & time: 03/06/22  1529     History  Chief Complaint  Patient presents with   Chest Pain    Katelyn Irwin is a 56 y.o. female.  56 year old female with a past medical history of B-cell non-Hodgkin's lymphoma s/p CHOP/Rituxan currently in remission presents to the ED following a gradual onset of central chest pain at 11 AM.  Patient describes pain as a tightness that radiates through to her back.  She denies associated shortness of breath, nausea, vomiting with this.  She states that this episode waxed and waned for several hours before gradually resolving while in the waiting room of the ED around 5:30 PM.  She states that she had a similar episode approximately 2 weeks ago which lasted 1 hour before spontaneously resolving.  Prior to that, she denies known cardiac history.  She denies recent illnesses, no tobacco use.  Denies history of DVT/PE.  The history is provided by the patient and the spouse.       Home Medications Prior to Admission medications   Medication Sig Start Date End Date Taking? Authorizing Provider  Multiple Vitamin (MULTIVITAMIN) tablet Take 1 tablet by mouth daily.    [provider]      Allergies    Patient has no known allergies.    Review of Systems   Review of Systems  Constitutional:  Negative for fever.  Respiratory:  Negative for shortness of breath.   Cardiovascular:  Positive for chest pain. Negative for palpitations.  Gastrointestinal:  Negative for abdominal pain, nausea and vomiting.    Physical Exam Updated Vital Signs BP 130/76   Pulse (!) 50   Temp 97.8 F (36.6 C) (Oral)   Resp 14   SpO2 100%  Physical Exam Vitals and nursing note reviewed.  Constitutional:      General: She is not in acute distress.    Appearance: She is well-developed. She is not ill-appearing.  HENT:     Head: Normocephalic and atraumatic.  Eyes:      Conjunctiva/sclera: Conjunctivae normal.  Cardiovascular:     Rate and Rhythm: Normal rate and regular rhythm.     Pulses:          Radial pulses are 2+ on the right side and 2+ on the left side.     Heart sounds: No murmur heard. Pulmonary:     Effort: Pulmonary effort is normal. No respiratory distress.     Breath sounds: Normal breath sounds.  Abdominal:     Palpations: Abdomen is soft.     Tenderness: There is no abdominal tenderness. There is no guarding.  Musculoskeletal:        General: No swelling.     Cervical back: Neck supple.     Right lower leg: No edema.     Left lower leg: No edema.  Skin:    General: Skin is warm and dry.  Neurological:     Mental Status: She is alert.     ED Results / Procedures / Treatments   Labs (all labs ordered are listed, but only abnormal results are displayed) Labs Reviewed  COMPREHENSIVE METABOLIC PANEL - Abnormal; Notable for the following components:      Result Value   Alkaline Phosphatase 27 (*)    Total Bilirubin 1.9 (*)    All other components within normal limits  CBC  TROPONIN I (HIGH SENSITIVITY)  TROPONIN I (HIGH SENSITIVITY)  EKG EKG Interpretation  Date/Time:  Thursday March 06 2022 20:23:37 EDT Ventricular Rate:  51 PR Interval:  201 QRS Duration: 97 QT Interval:  450 QTC Calculation: 415 R Axis:   26 Text Interpretation: Sinus rhythm Low voltage, precordial leads RSR' in V1 or V2, right VCD or RVH Confirmed by Nanda Quinton [phone]) on 03/06/2022 8:57:34 PM  Radiology DG Chest 2 View  Result Date: 03/06/2022 CLINICAL DATA:  Chest pain EXAM: CHEST - 2 VIEW COMPARISON:  07/09/2010 FINDINGS: Normal heart size, mediastinal contours, and pulmonary vascularity. Lungs clear. No pulmonary infiltrate, pleural effusion, or pneumothorax. Minimal biconvex thoracic scoliosis. IMPRESSION: No acute abnormalities. Electronically Signed   By: Lavonia Dana M.D.   On: 03/06/2022 16:11    Procedures Procedures     Medications Ordered in ED Medications - No data to display  ED Course/ Medical Decision Making/ A&P                           Medical Decision Making Amount and/or Complexity of Data Reviewed Labs: ordered. Radiology: ordered.   ***  {Document critical care time when appropriate:1} {Document review of labs and clinical decision tools ie heart score, Chads2Vasc2 etc:1}  {Document your independent review of radiology images, and any outside records:1} {Document your discussion with family members, caretakers, and with consultants:1} {Document social determinants of health affecting pt's care:1} {Document your decision making why or why not admission, treatments were needed:1} Final Clinical Impression(s) / ED Diagnoses Final diagnoses:  Precordial chest pain    Rx / DC Orders ED Discharge Orders          Ordered    Ambulatory referral to Cardiology       Comments: If you have not heard from the Cardiology office within the next 72 hours please call [phone].   03/06/22 2154

## 2022-03-06 NOTE — Discharge Instructions (Addendum)
We have placed a referral to cardiology and the clinic number is listed above. You will also need to get established with a primary doctor for further evaluation. If you have any worsening of your chest pain or trouble breathing, please return to the emergency room for evaluation. We hope you feel better soon.

## 2022-03-12 ENCOUNTER — Ambulatory Visit (INDEPENDENT_AMBULATORY_CARE_PROVIDER_SITE_OTHER): Payer: BC Managed Care – PPO | Admitting: Cardiology

## 2022-03-12 ENCOUNTER — Encounter: Payer: Self-pay | Admitting: Cardiology

## 2022-03-12 VITALS — BP 119/79 | HR 58 | Ht 66.0 in | Wt 160.0 lb

## 2022-03-12 DIAGNOSIS — R079 Chest pain, unspecified: Secondary | ICD-10-CM | POA: Diagnosis not present

## 2022-03-12 NOTE — Progress Notes (Signed)
Primary Care Provider: Pcp, No [facility] Cardiologist: Glenetta Hew, MD Electrophysiologist: None Oncologist: [name], MD  Clinic Note: Chief Complaint  Patient presents with   Hospitalization Follow-up    ER follow-up for chest pain.  New patient    ===================================  ASSESSMENT/PLAN   Problem List Items Addressed This Visit     Chest pain of uncertain etiology - Primary    1 relatively short another very long episode of chest discomfort.  Somewhat atypical in nature.  Not necessarily exertional.  Somewhat concerning with her history of lymphoma.  Low likelihood of being cardiac, however would like to exclude high-grade coronary disease.  Plan: ETT/GXT. Pending results may consider additional screening evaluation If noncardiac, most likely musculoskeletal given location and radiation to the back.  Discussed using a low-dose NSAID taper and or Tylenol.  Plan: Myoview stress test.  (Will attempt to do treadmill)      Relevant Orders   EKG 12-Lead (Completed)   EXERCISE TOLERANCE TEST (ETT)   Cardiac Stress Test: Informed Consent Details: Physician/Practitioner Attestation; Transcribe to consent form and obtain patient signature    ===================================  HPI:    Katelyn Irwin is a 56 y.o. female non-smoker, nondiabetic with history of B-cell non-Hodgkin's lymphoma (s/p R-CHOP, currently in remission) who is being seen today for the evaluation of PRECORDIAL CHEST PAIN at the request of Long, Wonda Olds, MD-[place], ER Physician.  Recent Hospitalizations:  [facility] 03/04/2022: Noted gradual onset chest pain began at 11 AM.  Tightness radiated to the back.  No associated dyspnea, nausea or vomiting.  When asked him if several hours before resolving fully.  I had a similar episode 2 weeks prior to this.  Was concerned as her grandfather passed away from heart attack at age 40. Troponins negative x2.  Risk scales were  basically 0 for ACS.  Bedside echocardiogram was largely workable.  [name] was last seen by Dr. Malachy Mood back on September 05, 2021.  Doing well.  No fevers or night sweats.  No palpable nodes.  Appetite doing well.  Seemingly in clinical remission.  Decided to forego surveillance CT Noted initial diagnosis of high-grade B cell non-Hodgkin's lymphoma (presenting symptoms pelvic pain secondary to bulky pelvic lymphadenopathy) in January 2010-has had 6 cycles of R-CHOP with initial complete response.   Recurrent low-grade lymphoma involving left supraclavicular nodes in September 2012 with Rituxan plus field radiation followed by maintenance Rituxan (completed August 2013).   Chronic leukopenia.   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 Right orbital fullness noted February 2021-CT scan showed subperiosteal soft tissue thickening, excision lesion 02/03/2020-atypical lymphoid infiltrate consistent with non-Hodgkin's B-cell lymphoma-low-grade.  Unclear if there is recurrence of follicular lymphoma or new disease process possible marginal zone lymphoma -> radiation to right frontal scalp scalp/skull in June to July 2021.  Stable findings on CT in December 2021 no evidence of recurrent lymphoma.  Reviewed  CV studies:    The following studies were reviewed today: (if available, images/films reviewed: From Epic Chart or Care Everywhere) None:  Interval History:   Katelyn Irwin presents today for cardiology evaluation after ER visit.  She describes having an episode of chest pain that lasted about 11 AM to 5 PM.  It started center of her chest and radiated to the back.  It was persistent but it can have  would come and go.  There were no other associated symptoms such as dyspnea or diaphoresis or fatigue or nausea.  This occurred while  she was doing some minimal amount of housekeeping.  Nothing strenuous.  She did that there is no real change whether she was at rest or with doing walking around. She has not had any further episodes of chest discomfort.  The initial spell she said was pretty intense lasting about 30 minutes-this was a couple weeks before.  The next episode the intense part was still about 30 minutes but then its lingered for the next 4 to 5 hours and asked what took her to the ER.  Again both occurred with her not doing much of any activity and did not get worse with walking around.  CV Review of Symptoms (Summary) Cardiovascular ROS: positive for - chest pain and but none since ER (less intense~30 min spell 2 wks prior) negative for - dyspnea on exertion, edema, irregular heartbeat, orthopnea, palpitations, paroxysmal nocturnal dyspnea, rapid heart rate, shortness of breath, or syncope/near syncope, TIA/amaurosis fugax.  REVIEWED OF SYSTEMS   Review of Systems  Constitutional:  Negative for malaise/fatigue (Maybe a little bit deconditioned but not lethargic or fatigued) and weight loss.  HENT:  Negative for congestion.   Respiratory:  Negative for cough, shortness of breath and wheezing.   Cardiovascular:        Per HPI  Gastrointestinal:  Negative for blood in stool and melena.  Genitourinary:  Negative for hematuria.  Musculoskeletal:  Negative for joint pain and myalgias.  Neurological:  Negative for dizziness, focal weakness and weakness (She felt little bit weak with the chest discomfort but not otherwise.).  Psychiatric/Behavioral:  Negative for depression and memory loss. The patient is not nervous/anxious (Only anxious when she had chest discomfort) and does not have insomnia.   All other systems reviewed and are negative.   I have reviewed and (if needed) personally updated the patient's problem list, medications, allergies, past medical and surgical history, social and family history.   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   Drug-induced leukopenia ([place]) 02/20/2012   Due to prior chemo for lymphoma   High grade malignant lymphoma ([place]) 08/14/2011   Per pt/ had 3 times since 2010-2015   History of kidney stones    passed   History of radiation therapy 07/17/11- 08/05/11   left neck30gy/5 fxs   Low grade B-cell lymphoma ([place]) 08/14/2011   Lymphoma ([place])    Lymphoma ([place])    Pneumonia 2010   Pyloric stenosis, congenital    surgical repair as infant    PAST SURGICAL HISTORY   Past Surgical History:  Procedure Laterality Date   COLONOSCOPY     LESION REMOVAL Right 02/03/2020   Procedure: EXCISION OF RIGHT FOREHEAD LESION;  Surgeon: Helayne Seminole, MD;  Location: [facility];  Service: ENT;  Laterality: Right;   LYMPH NODE BIOPSY Left    neck   MANDIBLE RECONSTRUCTION     as child, age 3/ per pt was a 5th grader   pyloric stenosis surgery     as infant    Immunization History  Administered Date(s) Administered   Influenza Split 07/02/2012   Influenza,inj,Quad PF,6+ Mos 06/26/2014, 07/23/2015, 07/24/2016, 07/21/2017,  11/01/2018, 08/02/2019, 08/20/2020   PFIZER(Purple Top)SARS-COV-2 Vaccination 11/29/2019, 12/27/2019    MEDICATIONS/ALLERGIES   No outpatient medications have been marked as taking for the 03/12/22 encounter (Office Visit) with Leonie Man, MD.    No Known Allergies  SOCIAL HISTORY/FAMILY HISTORY   Reviewed in Epic:   Social History   Tobacco Use   Smoking status: Never   Smokeless tobacco: Never  Vaping Use   Vaping Use: Never used  Substance Use Topics   Alcohol use: No   Drug use: No   Social History   Social History Narrative   Not on file   Family History  Problem Relation Age of Onset   Ovarian cancer Maternal Grandmother      OBJCTIVE -PE, EKG, labs   Wt Readings from Last 3 Encounters:  03/13/22 158 lb 3.2 oz (71.8 kg)  03/12/22 160 lb (72.6 kg)  09/05/21 164 lb 9.6 oz (74.7 kg)    Physical Exam: BP 119/79   Pulse (!) 58   Ht '5\' 6"'$  (1.676 m)   Wt 160 lb (72.6 kg)   SpO2 98%   BMI 25.82 kg/m  Physical Exam Vitals reviewed.  Constitutional:      General: She is not in acute distress.    Appearance: Normal appearance. She is normal weight. She is not ill-appearing (Healthy-appearing.  Well-nourished and well-groomed.) or toxic-appearing.  HENT:     Head: Normocephalic and atraumatic.  Neck:     Vascular: No carotid bruit or JVD.  Cardiovascular:     Rate and Rhythm: Normal rate and regular rhythm. No extrasystoles are present.    Chest Wall: PMI is not displaced.     Pulses: Normal pulses.     Heart sounds: S1 normal and S2 normal. No murmur heard.    No friction rub. No gallop.  Pulmonary:     Effort: Pulmonary effort is normal. No respiratory distress.     Breath sounds: Normal breath sounds. No wheezing, rhonchi or rales.  Abdominal:     General: Abdomen is flat. Bowel sounds are normal. There is no distension.     Palpations: Abdomen is soft. There is no mass (No HSM or bruit).     Tenderness: There is no abdominal tenderness. There is no guarding.  Musculoskeletal:        General: No swelling. Normal range of motion.     Cervical back: Normal range of motion and neck supple.     Right lower leg: Edema present.     Left lower leg: No edema.  Skin:    General: Skin is warm and dry.  Neurological:     General: No focal deficit present.     Mental Status: She is alert and oriented to person, place, and time.     Motor: No weakness.     Gait: Gait normal.  Psychiatric:        Mood and Affect: Mood normal.        Behavior: Behavior normal.        Thought Content: Thought content normal.        Judgment: Judgment normal.     Adult ECG Report  Rate: 58 ;  Rhythm: sinus  bradycardia and Otherwise normal axis, intervals durations. ;   Narrative Interpretation: Normal  Recent Labs: Reviewed.   No results found for: "CHOL", "HDL", "[place]", "LDLDIRECT", "TRIG", "CHOLHDL" Lab Results  Component Value Date   CREATININE 0.88 03/06/2022   BUN 17 03/06/2022   NA 142 03/06/2022  K 4.0 03/06/2022   CL 104 03/06/2022   CO2 27 03/06/2022      Latest Ref Rng & Units 03/06/2022    3:42 PM 09/05/2021    2:14 PM 03/06/2021    9:33 AM  CBC  WBC 4.0 - 10.5 K/uL 5.1  5.4  5.0   Hemoglobin 12.0 - 15.0 g/dL 13.8  12.7  12.8   Hematocrit 36.0 - 46.0 % 41.5  39.2  38.4   Platelets 150 - 400 K/uL 202  255  191     No results found for: "HGBA1C" Lab Results  Component Value Date   TSH 2.148 09/05/2021    ==================================================  COVID-19 Education: The signs and symptoms of COVID-19 were discussed with the patient and how to seek care for testing (follow up with PCP or arrange E-visit).    I spent a total of 32 minutes with the patient spent in direct patient consultation.  Additional time spent with chart review  / charting (studies, outside notes, etc): 14 min Total Time: 46 min  Current medicines are reviewed at length with the patient today.  (+/- concerns) N/A  Notice: This dictation was prepared with Dragon dictation along with smart phrase technology. Any transcriptional errors that result from this process are unintentional and may not be corrected upon review.   Studies Ordered:  Orders Placed This Encounter  Procedures   Cardiac Stress Test: Informed Consent Details: Physician/Practitioner Attestation; Transcribe to consent form and obtain patient signature   EXERCISE TOLERANCE TEST (ETT)   EKG 12-Lead   No orders of the defined types were placed in this encounter.  Shared Decision Making/Informed Consent The risks [chest pain, shortness of breath, cardiac arrhythmias, dizziness, blood pressure fluctuations,  myocardial infarction, stroke/transient ischemic attack, and life-threatening complications (estimated to be 1 in 10,000)], benefits (risk stratification, diagnosing coronary artery disease, treatment guidance) and alternatives of an exercise tolerance test were discussed in detail with Ms. Bogacz and she agrees to proceed.  Patient Instructions / Medication Changes & Studies & Tests Ordered   Patient Instructions  Medication Instructions:   No changes *If you need a refill on your cardiac medications before your next appointment, please call your pharmacy*   Lab Work:  Not needed   Testing/Procedures: Your physician has requested that you have an exercise tolerance test. Please also follow instruction sheet, as given.    Follow-Up: At [facility], you and your health needs are our priority.  As part of our continuing mission to provide you with exceptional heart care, we have created designated Provider Care Teams.  These Care Teams include your primary Cardiologist (physician) and Advanced Practice Providers (APPs -  Physician Assistants and Nurse Practitioners) who all work together to provide you with the care you need, when you need it.     Your next appointment:   2 month(s)  The format for your next appointment:   In Person  Provider:   Glenetta Hew, MD    Other Instructions      Glenetta Hew, M.D., M.S. Interventional Cardiologist   Pager # [phone] Phone # [phone] [address] [place],  [address]   Thank you for choosing Heartcare at [facility]!!

## 2022-03-12 NOTE — Patient Instructions (Addendum)
Medication Instructions:   No changes *If you need a refill on your cardiac medications before your next appointment, please call your pharmacy*   Lab Work:  Not needed   Testing/Procedures: Your physician has requested that you have an exercise tolerance test. Please also follow instruction sheet, as given.    Follow-Up: At [facility], you and your health needs are our priority.  As part of our continuing mission to provide you with exceptional heart care, we have created designated Provider Care Teams.  These Care Teams include your primary Cardiologist (physician) and Advanced Practice Providers (APPs -  Physician Assistants and Nurse Practitioners) who all work together to provide you with the care you need, when you need it.     Your next appointment:   2 month(s)  The format for your next appointment:   In Person  Provider:   Glenetta Hew, MD    Other Instructions

## 2022-03-13 ENCOUNTER — Inpatient Hospital Stay: Payer: BC Managed Care – PPO | Attending: Nurse Practitioner | Admitting: Nurse Practitioner

## 2022-03-13 ENCOUNTER — Encounter: Payer: Self-pay | Admitting: Nurse Practitioner

## 2022-03-13 VITALS — BP 113/80 | HR 61 | Temp 97.8°F | Resp 18 | Ht 66.0 in | Wt 158.2 lb

## 2022-03-13 DIAGNOSIS — Z8572 Personal history of non-Hodgkin lymphomas: Secondary | ICD-10-CM | POA: Insufficient documentation

## 2022-03-13 DIAGNOSIS — R079 Chest pain, unspecified: Secondary | ICD-10-CM | POA: Insufficient documentation

## 2022-03-13 DIAGNOSIS — Z923 Personal history of irradiation: Secondary | ICD-10-CM | POA: Insufficient documentation

## 2022-03-13 DIAGNOSIS — D72819 Decreased white blood cell count, unspecified: Secondary | ICD-10-CM | POA: Insufficient documentation

## 2022-03-13 DIAGNOSIS — Z79899 Other long term (current) drug therapy: Secondary | ICD-10-CM | POA: Insufficient documentation

## 2022-03-13 DIAGNOSIS — C8510 Unspecified B-cell lymphoma, unspecified site: Secondary | ICD-10-CM

## 2022-03-13 NOTE — Progress Notes (Signed)
  [facility] OFFICE PROGRESS NOTE   Diagnosis: Non-Hodgkin's lymphoma  INTERVAL HISTORY:   Katelyn Irwin returns as scheduled.  No fevers or sweats.  She has a good appetite.  Weight is stable.  She was evaluated in the emergency department 03/06/2022 with chest pain.  Etiology not determined.  She was seen by cardiology as an outpatient yesterday.  She reports a "stress test" is planned.  She has had no further episodes of chest pain.  Objective:  Vital signs in last 24 hours:  Blood pressure 113/80, pulse 61, temperature 97.8 F (36.6 C), temperature source Oral, resp. rate 18, height '5\' 6"'$  (1.676 m), weight 158 lb 3.2 oz (71.8 kg), SpO2 100 %.    HEENT: No mass at the right forehead/supraorbital region. Lymphatics: No palpable cervical, supraclavicular, axillary or inguinal lymph nodes. Resp: Lungs clear bilaterally. Cardio: Regular rate and rhythm. GI: Abdomen soft and nontender.  No hepatosplenomegaly. Vascular: No leg edema.   Lab Results:  Lab Results  Component Value Date   WBC 5.1 03/06/2022   HGB 13.8 03/06/2022   HCT 41.5 03/06/2022   MCV 93.7 03/06/2022   PLT 202 03/06/2022   NEUTROABS 2.1 09/05/2021    Imaging:  No results found.  Medications: I have reviewed the patient's current medications.  Assessment/Plan: High-grade B-cell non-Hodgkin's lymphoma presenting with pelvic pain found to have bulky pelvic lymphadenopathy January 2010. Status post 6 cycles of CHOP/Rituxan with a complete response. 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 Chronic leukopenia. 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 No clear evidence of active lymphoma. Extensive brown fat within the  lower neck.   Restaging CT scans 06/23/2014-negative for lymphoma 5.  Firm fullness superior to the right orbit February 2021 CT maxillofacial 10/28/2019-subperiosteal soft tissue thickening in the right frontal region, just above the superior orbital rim measuring Excision right forehead lesion 02/03/2020-atypical lymphoid infiltrate consistent with non-Hodgkins B-cell lymphoma, low-grade process favored, unclear whether this represents recurrence of follicular lymphoma or a new disease process, possibly marginal zone lymphoma CTs neck/chest/abdomen/pelvis 02/21/2020-no lymphadenopathy or other findings of recurrent lymphoma in the chest, abdomen or pelvis; no evidence of disease in the neck Radiation to right frontal scalp/skull 03/08/2020-03/29/2020, 30 Gy in 15 fractions CTs neck/check/abdomen/pelvis 09/04/2020-stable appearance of the neck, no significant cervical adenopathy, no evidence of recurrent lymphoma  Disposition: Katelyn Irwin remains in clinical remission from non-Hodgkin's lymphoma.  She will return for lab and follow-up in 6 months.  We are available to see her sooner if needed.  She will continue follow-up with cardiology regarding the recent episodes of chest pain.    Ned Card ANP/GNP-BC   03/13/2022  9:17 AM

## 2022-03-14 ENCOUNTER — Encounter: Payer: Self-pay | Admitting: Cardiology

## 2022-03-14 NOTE — Assessment & Plan Note (Addendum)
1 relatively short another very long episode of chest discomfort.  Somewhat atypical in nature.  Not necessarily exertional.  Somewhat concerning with her history of lymphoma.  Low likelihood of being cardiac, however would like to exclude high-grade coronary disease.  Plan: ETT/GXT. Pending results may consider additional screening evaluation If noncardiac, most likely musculoskeletal given location and radiation to the back.  Discussed using a low-dose NSAID taper and or Tylenol.  Plan: Myoview stress test.  (Will attempt to do treadmill)

## 2022-03-20 ENCOUNTER — Ambulatory Visit (HOSPITAL_COMMUNITY)
Admission: RE | Admit: 2022-03-20 | Discharge: 2022-03-20 | Disposition: A | Discharge status: Home / Self Care | Payer: BC Managed Care – PPO | Source: Ambulatory Visit | Attending: Cardiology | Admitting: Cardiology

## 2022-03-20 ENCOUNTER — Other Ambulatory Visit (HOSPITAL_COMMUNITY): Payer: Self-pay | Admitting: Cardiology

## 2022-03-20 DIAGNOSIS — R079 Chest pain, unspecified: Secondary | ICD-10-CM

## 2022-03-20 LAB — EXERCISE TOLERANCE TEST
Estimated workload: 10.4
Estimated workload: 10.4
Exercise duration (min): 9 min
Exercise duration (min): 9 min
Exercise duration (sec): 13 s
Exercise duration (sec): 13 s
MPHR: 166 {beats}/min
MPHR: 166 {beats}/min
Peak HR: 153 {beats}/min
Peak HR: 153 {beats}/min
Percent HR: 92 %
Percent HR: 92 %
Rest HR: 62 {beats}/min
Rest HR: 62 {beats}/min
ST Depression (mm): 0.5 mm

## 2022-05-28 ENCOUNTER — Encounter: Payer: Self-pay | Admitting: Cardiology

## 2022-05-28 NOTE — Telephone Encounter (Signed)
error 

## 2022-06-04 ENCOUNTER — Ambulatory Visit: Payer: BC Managed Care – PPO | Admitting: Cardiology

## 2022-06-06 ENCOUNTER — Ambulatory Visit: Payer: BC Managed Care – PPO | Admitting: Cardiology

## 2022-07-23 ENCOUNTER — Ambulatory Visit: Payer: BC Managed Care – PPO | Admitting: Dermatology

## 2022-09-12 ENCOUNTER — Inpatient Hospital Stay (HOSPITAL_BASED_OUTPATIENT_CLINIC_OR_DEPARTMENT_OTHER): Payer: BC Managed Care – PPO | Admitting: Oncology

## 2022-09-12 ENCOUNTER — Inpatient Hospital Stay: Payer: BC Managed Care – PPO | Attending: Oncology

## 2022-09-12 VITALS — BP 122/77 | HR 64 | Temp 98.2°F | Resp 18 | Ht 66.0 in | Wt 162.0 lb

## 2022-09-12 DIAGNOSIS — R202 Paresthesia of skin: Secondary | ICD-10-CM | POA: Diagnosis not present

## 2022-09-12 DIAGNOSIS — D72819 Decreased white blood cell count, unspecified: Secondary | ICD-10-CM | POA: Insufficient documentation

## 2022-09-12 DIAGNOSIS — C8510 Unspecified B-cell lymphoma, unspecified site: Secondary | ICD-10-CM

## 2022-09-12 DIAGNOSIS — Z79899 Other long term (current) drug therapy: Secondary | ICD-10-CM | POA: Diagnosis not present

## 2022-09-12 DIAGNOSIS — Z923 Personal history of irradiation: Secondary | ICD-10-CM | POA: Diagnosis not present

## 2022-09-12 LAB — CMP (CANCER CENTER ONLY)
ALT: 25 U/L (ref 0–44)
AST: 20 U/L (ref 15–41)
Albumin: 4.6 g/dL (ref 3.5–5.0)
Alkaline Phosphatase: 26 U/L — ABNORMAL LOW (ref 38–126)
Anion gap: 11 (ref 5–15)
BUN: 18 mg/dL (ref 6–20)
CO2: 29 mmol/L (ref 22–32)
Calcium: 10.2 mg/dL (ref 8.9–10.3)
Chloride: 101 mmol/L (ref 98–111)
Creatinine: 0.95 mg/dL (ref 0.44–1.00)
GFR, Estimated: >60 mL/min (ref >60)
Glucose, Bld: 100 mg/dL — ABNORMAL HIGH (ref 70–99)
Potassium: 4.3 mmol/L (ref 3.5–5.1)
Sodium: 141 mmol/L (ref 135–145)
Total Bilirubin: 1 mg/dL (ref 0.3–1.2)
Total Protein: 7.1 g/dL (ref 6.5–8.1)

## 2022-09-12 LAB — CBC WITH DIFFERENTIAL (CANCER CENTER ONLY)
Abs Immature Granulocytes: 0.01 10*3/uL (ref 0.00–0.07)
Basophils Absolute: 0 10*3/uL (ref 0.0–0.1)
Basophils Relative: 1 %
Eosinophils Absolute: 0.3 10*3/uL (ref 0.0–0.5)
Eosinophils Relative: 7 %
HCT: 39 % (ref 36.0–46.0)
Hemoglobin: 13.2 g/dL (ref 12.0–15.0)
Immature Granulocytes: 0 %
Lymphocytes Relative: 44 %
Lymphs Abs: 2 10*3/uL (ref 0.7–4.0)
MCH: 30.8 pg (ref 26.0–34.0)
MCHC: 33.8 g/dL (ref 30.0–36.0)
MCV: 91.1 fL (ref 80.0–100.0)
Monocytes Absolute: 0.3 10*3/uL (ref 0.1–1.0)
Monocytes Relative: 6 %
Neutro Abs: 1.9 10*3/uL (ref 1.7–7.7)
Neutrophils Relative %: 42 %
Platelet Count: 210 10*3/uL (ref 150–400)
RBC: 4.28 MIL/uL (ref 3.87–5.11)
RDW: 12.3 % (ref 11.5–15.5)
WBC Count: 4.5 10*3/uL (ref 4.0–10.5)
nRBC: 0 % (ref 0.0–0.2)

## 2022-09-12 LAB — TSH: TSH: 3.157 u[IU]/mL (ref 0.350–4.500)

## 2022-09-12 NOTE — Progress Notes (Signed)
  Katelyn Irwin OFFICE PROGRESS NOTE   Diagnosis: Non-Hodgkin lymphoma  INTERVAL HISTORY:   Katelyn Irwin returns as scheduled.  She feels well.  No fever or night sweats.  No palpable lymph nodes.  She has occasional "tingling "at the right forehead and cheek.  No recurrent mass at the right forehead.  Objective:  Vital signs in last 24 hours:  Blood pressure 122/77, pulse 64, temperature 98.2 F (36.8 C), temperature source Oral, resp. rate 18, height '5\' 6"'$  (1.676 m), weight 162 lb (73.5 kg), SpO2 100 %.    HEENT: No mass at the right lower forehead Lymphatics: No cervical, supraclavicular, axillary, or inguinal nodes Resp: Lungs clear bilaterally Cardio: Regular rate and rhythm GI: No hepatosplenomegaly, no mass, nontender Vascular: No leg edema     Lab Results:  Lab Results  Component Value Date   WBC 4.5 09/12/2022   HGB 13.2 09/12/2022   HCT 39.0 09/12/2022   MCV 91.1 09/12/2022   PLT 210 09/12/2022   NEUTROABS 1.9 09/12/2022    CMP  Lab Results  Component Value Date   NA 141 09/12/2022   K 4.3 09/12/2022   CL 101 09/12/2022   CO2 29 09/12/2022   GLUCOSE 100 (H) 09/12/2022   BUN 18 09/12/2022   CREATININE 0.95 09/12/2022   CALCIUM 10.2 09/12/2022   PROT 7.1 09/12/2022   ALBUMIN 4.6 09/12/2022   AST 20 09/12/2022   ALT 25 09/12/2022   ALKPHOS 26 (L) 09/12/2022   BILITOT 1.0 09/12/2022   GFRNONAA >60 09/12/2022   GFRAA >60 02/03/2020     Medications: I have reviewed the patient's current medications.   Assessment/Plan: High-grade B-cell non-Hodgkin's lymphoma presenting with pelvic pain found to have bulky pelvic lymphadenopathy January 2010. Status post 6 cycles of CHOP/Rituxan with a complete response. 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 Chronic  leukopenia. 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 No clear evidence of active lymphoma. Extensive brown fat within the lower neck.   Restaging CT scans 06/23/2014-negative for lymphoma 5.  Firm fullness superior to the right orbit February 2021 CT maxillofacial 10/28/2019-subperiosteal soft tissue thickening in the right frontal region, just above the superior orbital rim measuring Excision right forehead lesion 02/03/2020-atypical lymphoid infiltrate consistent with non-Hodgkins B-cell lymphoma, low-grade process favored, unclear whether this represents recurrence of follicular lymphoma or a new disease process, possibly marginal zone lymphoma CTs neck/chest/abdomen/pelvis 02/21/2020-no lymphadenopathy or other findings of recurrent lymphoma in the chest, abdomen or pelvis; no evidence of disease in the neck Radiation to right frontal scalp/skull 03/08/2020-03/29/2020, 30 Gy in 15 fractions CTs neck/check/abdomen/pelvis 09/04/2020-stable appearance of the neck, no significant cervical adenopathy, no evidence of recurrent lymphoma    Disposition: Katelyn Irwin is in clinical remission from non-Hodgkin's lymphoma.  She will return for an office visit in 8 months.  She will call in the interim for new symptoms.  [name], MD  09/12/2022  10:08 AM

## 2023-03-13 DIAGNOSIS — K224 Dyskinesia of esophagus: Secondary | ICD-10-CM | POA: Diagnosis not present

## 2023-03-13 DIAGNOSIS — E782 Mixed hyperlipidemia: Secondary | ICD-10-CM | POA: Diagnosis not present

## 2023-03-13 DIAGNOSIS — Z6825 Body mass index (BMI) 25.0-25.9, adult: Secondary | ICD-10-CM | POA: Diagnosis not present

## 2023-03-17 DIAGNOSIS — Z1231 Encounter for screening mammogram for malignant neoplasm of breast: Secondary | ICD-10-CM | POA: Diagnosis not present

## 2023-03-25 ENCOUNTER — Encounter: Payer: Self-pay | Admitting: Gastroenterology

## 2023-04-23 ENCOUNTER — Ambulatory Visit (INDEPENDENT_AMBULATORY_CARE_PROVIDER_SITE_OTHER): Payer: BC Managed Care – PPO | Admitting: Internal Medicine

## 2023-04-23 ENCOUNTER — Encounter: Payer: Self-pay | Admitting: Internal Medicine

## 2023-04-23 VITALS — BP 107/70 | HR 64 | Temp 97.8°F | Ht 66.0 in | Wt 156.2 lb

## 2023-04-23 DIAGNOSIS — R1013 Epigastric pain: Secondary | ICD-10-CM

## 2023-04-23 DIAGNOSIS — R1319 Other dysphagia: Secondary | ICD-10-CM

## 2023-04-23 DIAGNOSIS — Z1211 Encounter for screening for malignant neoplasm of colon: Secondary | ICD-10-CM

## 2023-04-23 DIAGNOSIS — K219 Gastro-esophageal reflux disease without esophagitis: Secondary | ICD-10-CM | POA: Diagnosis not present

## 2023-04-23 DIAGNOSIS — Z6825 Body mass index (BMI) 25.0-25.9, adult: Secondary | ICD-10-CM | POA: Diagnosis not present

## 2023-04-23 DIAGNOSIS — R933 Abnormal findings on diagnostic imaging of other parts of digestive tract: Secondary | ICD-10-CM

## 2023-04-23 DIAGNOSIS — Z8571 Personal history of Hodgkin lymphoma: Secondary | ICD-10-CM | POA: Diagnosis not present

## 2023-04-23 DIAGNOSIS — G8929 Other chronic pain: Secondary | ICD-10-CM

## 2023-04-23 DIAGNOSIS — K224 Dyskinesia of esophagus: Secondary | ICD-10-CM | POA: Diagnosis not present

## 2023-04-23 DIAGNOSIS — Z0000 Encounter for general adult medical examination without abnormal findings: Secondary | ICD-10-CM | POA: Diagnosis not present

## 2023-04-23 MED ORDER — PANTOPRAZOLE SODIUM 40 MG PO TBEC: 40.0000 mg | DELAYED_RELEASE_TABLET | Freq: Every day | ORAL | 11 refills | Status: DC

## 2023-04-23 NOTE — Progress Notes (Signed)
Primary Care Physician:  Sheela Stack Primary Gastroenterologist:  Dr. Marletta Lor  Chief Complaint  Patient presents with   New Patient (Initial Visit)    Pt referred for ? Esophageal spasms    HPI:   Katelyn Irwin is a 57 y.o. female who presents to the clinic today by referral from her PCP Dr. Neita Carp for evaluation.  Patient reports significant epigastric/chest pain.  Moderate severity, occasionally severe, intermittent.  When it started it occurred every 2 weeks now sometimes more frequent, sometimes less, once a month.  Episodes last anywhere from 30 minutes to 8 hours long.  Pain radiates to her back.  Underwent cardiac workup which was largely unremarkable.  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  She had a CT of her chest performed 09/04/2020 which showed hiatal hernia and esophageal wall thickening.  I personally reviewed this CT with her today.  Does note some chronic GERD/indigestion.  Takes Pepcid as needed.  Occasional esophageal dysphagia, feels like food gets stuck in her substernal region.  Last colonoscopy 2019 unremarkable recommended 10-year recall.  Patient denies any family history of colorectal malignancy.  No melena hematochezia.  No unintentional weight loss.  Denies any chronic NSAID use.  No history of H. pylori or peptic ulcer disease.  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   Lymphoma (HCC)    Lymphoma (HCC)    Pneumonia 2010   Pyloric  stenosis, congenital    surgical repair as infant    Past Surgical History:  Procedure Laterality Date   COLONOSCOPY     LESION REMOVAL Right 02/03/2020   Procedure: EXCISION OF RIGHT FOREHEAD LESION;  Surgeon: Graylin Shiver, MD;  Location: [facility] OR;  Service: ENT;  Laterality: Right;   LYMPH NODE BIOPSY Left    neck   MANDIBLE RECONSTRUCTION     as child, age 75/ per pt was a 5th grader   pyloric stenosis surgery     as infant    Current Outpatient Medications  Medication Sig Dispense Refill   Multiple Vitamin (MULTIVITAMIN) tablet Take 1 tablet by mouth daily.     Probiotic Product (PROBIOTIC PO) Take 1 tablet by mouth daily.     No current facility-administered medications for this visit.   Facility-Administered Medications Ordered in Other Visits  Medication Dose Route Frequency Provider Last Rate Last Admin   hyaluronate sodium (RADIAPLEXRX) gel   Topical BID Margaretmary Dys, MD        Allergies as of 04/23/2023   (No Known Allergies)    Family History  Problem Relation Age of Onset   Ovarian cancer Maternal Grandmother     Social History   Socioeconomic History   Marital status: Married    Spouse name: Not on file   Number of children: 3   Years of education: Not on file   Highest education level: Not on file  Occupational History  Occupation: Runner, broadcasting/film/video  Tobacco Use   Smoking status: Never   Smokeless tobacco: Never  Vaping Use   Vaping status: Never Used  Substance and Sexual Activity   Alcohol use: No   Drug use: No   Sexual activity: Yes  Other Topics Concern   Not on file  Social History Narrative   Not on file   Social Determinants of Health   Financial Resource Strain: Not on file  Food Insecurity: Not on file  Transportation Needs: Not on file  Physical Activity: Not on file  Stress: Not on file  Social Connections: Not on file  Intimate Partner Violence: Not on file    Subjective: Review of Systems  Constitutional:  Negative  for chills and fever.  HENT:  Negative for congestion and hearing loss.   Eyes:  Negative for blurred vision and double vision.  Respiratory:  Negative for cough and shortness of breath.   Cardiovascular:  Negative for chest pain and palpitations.  Gastrointestinal:  Positive for abdominal pain and heartburn. Negative for blood in stool, constipation, diarrhea, melena and vomiting.  Genitourinary:  Negative for dysuria and urgency.  Musculoskeletal:  Negative for joint pain and myalgias.  Skin:  Negative for itching and rash.  Neurological:  Negative for dizziness and headaches.  Psychiatric/Behavioral:  Negative for depression. The patient is not nervous/anxious.        Objective: BP 107/70   Pulse 64   Temp 97.8 F (36.6 C)   Ht 5\' 6"  (1.676 m)   Wt 156 lb 3.2 oz (70.9 kg)   BMI 25.21 kg/m  Physical Exam Constitutional:      Appearance: Normal appearance.  HENT:     Head: Normocephalic and atraumatic.  Eyes:     Extraocular Movements: Extraocular movements intact.     Conjunctiva/sclera: Conjunctivae normal.  Cardiovascular:     Rate and Rhythm: Normal rate and regular rhythm.  Pulmonary:     Effort: Pulmonary effort is normal.     Breath sounds: Normal breath sounds.  Abdominal:     General: Bowel sounds are normal.     Palpations: Abdomen is soft.  Musculoskeletal:        General: No swelling. Normal range of motion.     Cervical back: Normal range of motion and neck supple.  Skin:    General: Skin is warm and dry.     Coloration: Skin is not jaundiced.  Neurological:     General: No focal deficit present.     Mental Status: She is alert and oriented to person, place, and time.  Psychiatric:        Mood and Affect: Mood normal.        Behavior: Behavior normal.      Assessment: *Epigastric pain *Chronic GERD *Abnormal CT esophagus *Colon cancer screening *Esophageal dysphagia  Plan: Etiology of patient's symptoms unclear. Will schedule for EGD with  possible dilation to evaluate for peptic ulcer disease, esophagitis, gastritis, H. Pylori, duodenitis, or other. Will also evaluate for esophageal stricture, Schatzki's ring, esophageal web or other.   The risks including infection, bleed, or perforation as well as benefits, limitations, alternatives and imponderables have been reviewed with the patient. Potential for esophageal dilation, biopsy, etc. have also been reviewed.  Questions have been answered. All parties agreeable.  Will start on pantoprazole 40 mg daily.  If upper endoscopy relatively unremarkable, will pursue esophageal manometry to rule out esophageal spasms.  Colonoscopy recall 2029 for colon cancer screening.  Thank you Dr.  Sasser for the kind referral 04/23/2023 11:50 AM   Disclaimer: This note was dictated with voice recognition software. Similar sounding words can inadvertently be transcribed and may not be corrected upon review.

## 2023-04-23 NOTE — Patient Instructions (Addendum)
We will schedule you for upper endoscopy to further evaluate your epigastric pain, abnormal CT imaging, as well as your chronic acid reflux/indigestion.  I am going to start you on a new medication called pantoprazole 40 mg.  This medication works best if you take it first thing in the morning at least 30 minutes before breakfast.  You can take Pepcid on top of this as needed for breakthrough symptoms.  You will be due for colonoscopy for colon cancer screening purposes 2029.  It was very nice meeting you today.  Dr. Marletta Lor

## 2023-04-24 ENCOUNTER — Encounter: Payer: Self-pay | Admitting: Internal Medicine

## 2023-04-29 ENCOUNTER — Encounter: Payer: Self-pay | Admitting: *Deleted

## 2023-04-29 DIAGNOSIS — I890 Lymphedema, not elsewhere classified: Secondary | ICD-10-CM | POA: Diagnosis not present

## 2023-04-29 NOTE — Telephone Encounter (Signed)
Per [employer]IM "The following solutions for the service date entered do not require Pre-Authorization by [employer]. Please note that benefit limits, if applicable, will still be applied. Contact the health plan using the number on the back of the member's ID card if you have any questions regarding coverage or Pre-Authorization requirements.: ?

## 2023-05-04 DIAGNOSIS — I890 Lymphedema, not elsewhere classified: Secondary | ICD-10-CM | POA: Diagnosis not present

## 2023-05-11 DIAGNOSIS — I890 Lymphedema, not elsewhere classified: Secondary | ICD-10-CM | POA: Diagnosis not present

## 2023-05-14 ENCOUNTER — Inpatient Hospital Stay: Payer: BC Managed Care – PPO | Attending: Oncology | Admitting: Oncology

## 2023-05-14 ENCOUNTER — Inpatient Hospital Stay: Payer: BC Managed Care – PPO

## 2023-05-14 VITALS — BP 114/72 | HR 60 | Temp 98.1°F | Resp 20 | Ht 66.0 in | Wt 157.0 lb

## 2023-05-14 DIAGNOSIS — C8510 Unspecified B-cell lymphoma, unspecified site: Secondary | ICD-10-CM

## 2023-05-14 DIAGNOSIS — M549 Dorsalgia, unspecified: Secondary | ICD-10-CM | POA: Diagnosis not present

## 2023-05-14 DIAGNOSIS — Z79899 Other long term (current) drug therapy: Secondary | ICD-10-CM | POA: Diagnosis not present

## 2023-05-14 DIAGNOSIS — D72819 Decreased white blood cell count, unspecified: Secondary | ICD-10-CM | POA: Insufficient documentation

## 2023-05-14 DIAGNOSIS — R1310 Dysphagia, unspecified: Secondary | ICD-10-CM | POA: Diagnosis not present

## 2023-05-14 DIAGNOSIS — Z8572 Personal history of non-Hodgkin lymphomas: Secondary | ICD-10-CM | POA: Diagnosis not present

## 2023-05-14 LAB — CMP (CANCER CENTER ONLY)
ALT: 33 U/L (ref 0–44)
AST: 21 U/L (ref 15–41)
Albumin: 4.7 g/dL (ref 3.5–5.0)
Alkaline Phosphatase: 27 U/L — ABNORMAL LOW (ref 38–126)
Anion gap: 7 (ref 5–15)
BUN: 14 mg/dL (ref 6–20)
CO2: 31 mmol/L (ref 22–32)
Calcium: 10.1 mg/dL (ref 8.9–10.3)
Chloride: 101 mmol/L (ref 98–111)
Creatinine: 0.94 mg/dL (ref 0.44–1.00)
GFR, Estimated: >60 mL/min (ref >60)
Glucose, Bld: 100 mg/dL — ABNORMAL HIGH (ref 70–99)
Potassium: 4.2 mmol/L (ref 3.5–5.1)
Sodium: 139 mmol/L (ref 135–145)
Total Bilirubin: 1.4 mg/dL — ABNORMAL HIGH (ref 0.3–1.2)
Total Protein: 7.1 g/dL (ref 6.5–8.1)

## 2023-05-14 LAB — CBC WITH DIFFERENTIAL (CANCER CENTER ONLY)
Abs Immature Granulocytes: 0 10*3/uL (ref 0.00–0.07)
Basophils Absolute: 0 10*3/uL (ref 0.0–0.1)
Basophils Relative: 1 %
Eosinophils Absolute: 0.2 10*3/uL (ref 0.0–0.5)
Eosinophils Relative: 4 %
HCT: 40.8 % (ref 36.0–46.0)
Hemoglobin: 13.6 g/dL (ref 12.0–15.0)
Immature Granulocytes: 0 %
Lymphocytes Relative: 44 %
Lymphs Abs: 1.8 10*3/uL (ref 0.7–4.0)
MCH: 30.8 pg (ref 26.0–34.0)
MCHC: 33.3 g/dL (ref 30.0–36.0)
MCV: 92.5 fL (ref 80.0–100.0)
Monocytes Absolute: 0.2 10*3/uL (ref 0.1–1.0)
Monocytes Relative: 6 %
Neutro Abs: 1.9 10*3/uL (ref 1.7–7.7)
Neutrophils Relative %: 45 %
Platelet Count: 213 10*3/uL (ref 150–400)
RBC: 4.41 MIL/uL (ref 3.87–5.11)
RDW: 12.5 % (ref 11.5–15.5)
WBC Count: 4.2 10*3/uL (ref 4.0–10.5)
nRBC: 0 % (ref 0.0–0.2)

## 2023-05-14 LAB — LACTATE DEHYDROGENASE: LDH: 155 U/L (ref 98–192)

## 2023-05-14 LAB — TSH: TSH: 3.18 u[IU]/mL (ref 0.350–4.500)

## 2023-05-14 NOTE — Progress Notes (Signed)
[facility] OFFICE PROGRESS NOTE   Diagnosis: Non-Hodgkin's lymphoma  INTERVAL HISTORY:   Katelyn Irwin returns as scheduled.  She reports intermittent episodes of mid anterior chest discomfort and associated back pain for the past year.  The episodes last 30 minutes to hours.  No associated symptoms.  These episodes occur weeks apart.  She reports undergoing a negative cardiology evaluation.  She is scheduled for an endoscopy in September.  She has occasional episodes of solid dysphagia.  No nausea or vomiting.  Good appetite.  No fever or night sweats.  No palpable lymph nodes.   Objective:  Vital signs in last 24 hours:  Blood pressure 114/72, pulse 60, temperature 98.1 F (36.7 C), temperature source Oral, resp. rate 20, height 5\' 6"  (1.676 m), weight 157 lb (71.2 kg), SpO2 100%.    HEENT: Oropharynx without visible mass, neck without masses, no mass superior to the right eye. Lymphatics: No cervical, supraclavicular, axillary, or inguinal nodes Resp: Lungs clear bilaterally Cardio: Regular rate and rhythm GI: No hepatosplenomegaly, nontender, no mass Vascular: No leg edema Musculoskeletal: No tenderness at the spine or anterior chest wall   Lab Results:  Lab Results  Component Value Date   WBC 4.2 05/14/2023   HGB 13.6 05/14/2023   HCT 40.8 05/14/2023   MCV 92.5 05/14/2023   PLT 213 05/14/2023   NEUTROABS 1.9 05/14/2023    CMP  Lab Results  Component Value Date   NA 141 09/12/2022   K 4.3 09/12/2022   CL 101 09/12/2022   CO2 29 09/12/2022   GLUCOSE 100 (H) 09/12/2022   BUN 18 09/12/2022   CREATININE 0.95 09/12/2022   CALCIUM 10.2 09/12/2022   PROT 7.1 09/12/2022   ALBUMIN 4.6 09/12/2022   AST 20 09/12/2022   ALT 25 09/12/2022   ALKPHOS 26 (L) 09/12/2022   BILITOT 1.0 09/12/2022   GFRNONAA >60 09/12/2022   GFRAA >60 02/03/2020     Medications: I have reviewed the patient's current medications.   Assessment/Plan: High-grade B-cell  non-Hodgkin's lymphoma presenting with pelvic pain found to have bulky pelvic lymphadenopathy January 2010. Status post 6 cycles of CHOP/Rituxan with a complete response. 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 Chronic leukopenia. 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 No clear evidence of active lymphoma. Extensive brown fat within the lower neck.   Restaging CT scans 06/23/2014-negative for lymphoma 5.  Firm fullness superior to the right orbit February 2021 CT maxillofacial 10/28/2019-subperiosteal soft tissue thickening in the right frontal region, just above the superior orbital rim measuring Excision right forehead lesion 02/03/2020-atypical lymphoid infiltrate consistent with non-Hodgkins B-cell lymphoma, low-grade process favored, unclear whether this represents recurrence of follicular lymphoma or a new disease process, possibly marginal zone lymphoma CTs neck/chest/abdomen/pelvis 02/21/2020-no lymphadenopathy or other findings of recurrent lymphoma in the chest, abdomen or pelvis; no evidence of disease in the neck Radiation to right frontal scalp/skull 03/08/2020-03/29/2020, 30 Gy in 15 fractions CTs neck/check/abdomen/pelvis 09/04/2020-stable appearance of the neck, no significant cervical adenopathy, no evidence of recurrent lymphoma     Disposition: Katelyn Irwin is in clinical remission from non-Hodgkin's lymphoma.  She will return for an office visit in 8 months.  The etiology of the intermittent episodes of anterior chest/back discomfort is unclear.  She is scheduled for an upper endoscopy in September.  The  chest discomfort is most likely unrelated to the history of lymphoma.  Thornton Papas,  MD  05/14/2023  10:18 AM

## 2023-05-25 DIAGNOSIS — I890 Lymphedema, not elsewhere classified: Secondary | ICD-10-CM | POA: Diagnosis not present

## 2023-06-01 DIAGNOSIS — I890 Lymphedema, not elsewhere classified: Secondary | ICD-10-CM | POA: Diagnosis not present

## 2023-06-02 ENCOUNTER — Encounter (HOSPITAL_COMMUNITY)
Admission: RE | Disposition: A | Discharge status: Home / Self Care | Payer: Self-pay | Source: Home / Self Care | Attending: Internal Medicine

## 2023-06-02 ENCOUNTER — Ambulatory Visit (HOSPITAL_COMMUNITY): Payer: BC Managed Care – PPO | Admitting: Certified Registered Nurse Anesthetist

## 2023-06-02 ENCOUNTER — Ambulatory Visit (HOSPITAL_COMMUNITY)
Admission: RE | Admit: 2023-06-02 | Discharge: 2023-06-02 | Disposition: A | Discharge status: Home / Self Care | Payer: BC Managed Care – PPO | Attending: Internal Medicine | Admitting: Internal Medicine

## 2023-06-02 ENCOUNTER — Encounter (HOSPITAL_COMMUNITY): Payer: Self-pay

## 2023-06-02 ENCOUNTER — Other Ambulatory Visit: Payer: Self-pay

## 2023-06-02 DIAGNOSIS — K222 Esophageal obstruction: Secondary | ICD-10-CM | POA: Diagnosis not present

## 2023-06-02 DIAGNOSIS — K2970 Gastritis, unspecified, without bleeding: Secondary | ICD-10-CM | POA: Insufficient documentation

## 2023-06-02 DIAGNOSIS — R1310 Dysphagia, unspecified: Secondary | ICD-10-CM | POA: Insufficient documentation

## 2023-06-02 DIAGNOSIS — R933 Abnormal findings on diagnostic imaging of other parts of digestive tract: Secondary | ICD-10-CM

## 2023-06-02 DIAGNOSIS — J189 Pneumonia, unspecified organism: Secondary | ICD-10-CM | POA: Diagnosis not present

## 2023-06-02 DIAGNOSIS — K449 Diaphragmatic hernia without obstruction or gangrene: Secondary | ICD-10-CM | POA: Insufficient documentation

## 2023-06-02 DIAGNOSIS — R1013 Epigastric pain: Secondary | ICD-10-CM | POA: Insufficient documentation

## 2023-06-02 DIAGNOSIS — K3189 Other diseases of stomach and duodenum: Secondary | ICD-10-CM | POA: Diagnosis not present

## 2023-06-02 HISTORY — PX: BIOPSY: SHX5522

## 2023-06-02 HISTORY — PX: BALLOON DILATION: SHX5330

## 2023-06-02 HISTORY — PX: ESOPHAGOGASTRODUODENOSCOPY (EGD) WITH PROPOFOL: SHX5813

## 2023-06-02 SURGERY — ESOPHAGOGASTRODUODENOSCOPY (EGD) WITH PROPOFOL
Anesthesia: General

## 2023-06-02 MED ORDER — LACTATED RINGERS IV SOLN: INTRAVENOUS | Status: DC | PRN

## 2023-06-02 MED ORDER — PROPOFOL 10 MG/ML IV BOLUS
INTRAVENOUS | Status: DC | PRN
  Administered 2023-06-02 (×2): 30 mg via INTRAVENOUS
  Administered 2023-06-02: 80 mg via INTRAVENOUS

## 2023-06-02 MED ORDER — LIDOCAINE HCL (CARDIAC) PF 100 MG/5ML IV SOSY
PREFILLED_SYRINGE | INTRAVENOUS | Status: DC | PRN
  Administered 2023-06-02: 50 mg via INTRAVENOUS

## 2023-06-02 MED ORDER — PANTOPRAZOLE SODIUM 40 MG PO TBEC: 40.0000 mg | DELAYED_RELEASE_TABLET | Freq: Two times a day (BID) | ORAL | 11 refills | Status: DC

## 2023-06-02 NOTE — Transfer of Care (Signed)
Immediate Anesthesia Transfer of Care Note  Patient: Katelyn Irwin  Procedure(s) Performed: ESOPHAGOGASTRODUODENOSCOPY (EGD) WITH PROPOFOL BALLOON DILATION BIOPSY  Patient Location: Endoscopy Unit  Anesthesia Type:General  Level of Consciousness: awake, alert , and oriented  Airway & Oxygen Therapy: Patient Spontanous Breathing  Post-op Assessment: Report given to RN and Post -op Vital signs reviewed and stable  Post vital signs: Reviewed and stable  Last Vitals:  Vitals Value Taken Time  BP 95/65 06/02/23 0911  Temp 36.6 C 06/02/23 0911  Pulse 55 06/02/23 0911  Resp 15 06/02/23 0911  SpO2 95 % 06/02/23 0911    Last Pain:  Vitals:   06/02/23 0911  TempSrc: Oral  PainSc: 0-No pain      Patients Stated Pain Goal: 8 (06/02/23 0837)  Complications: No notable events documented.

## 2023-06-02 NOTE — H&P (Signed)
Primary Care Physician:  Royann Shivers, PA-C Primary Gastroenterologist:  Dr. Marletta Lor  Pre-Procedure History & Physical: HPI:  Katelyn Irwin is a 57 y.o. 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 Authorizing Provider  Multiple Vitamin (MULTIVITAMIN) tablet Take 1 tablet by mouth daily.   Yes [provider]  pantoprazole (PROTONIX) 40 MG tablet Take 1 tablet (40 mg total) by mouth daily. 04/23/23 04/22/24 Yes Lanelle Bal, DO    Allergies as of 04/29/2023   (No Known Allergies)    Family History  Problem Relation Age of Onset   Ovarian cancer Maternal Grandmother     Social History   Socioeconomic History   Marital status: Married    Spouse name: Not on file   Number of children: 3    Years of education: Not on file   Highest education level: Not on file  Occupational History   Occupation: Teacher  Tobacco Use   Smoking status: Never   Smokeless tobacco: Never  Vaping Use   Vaping status: Never Used  Substance and Sexual Activity   Alcohol use: No   Drug use: No   Sexual activity: Yes  Other Topics Concern   Not on file  Social History Narrative   Not on file   Social Determinants of Health   Financial Resource Strain: Not on file  Food Insecurity: Not on file  Transportation Needs: Not on file  Physical Activity: Not on file  Stress: Not on file  Social Connections: Not on file  Intimate Partner Violence: Not on file    Review of Systems: General: Negative for fever, chills, fatigue, weakness. Eyes: Negative for vision changes.  ENT: Negative for hoarseness, difficulty swallowing , nasal congestion. CV: Negative for chest pain, angina, palpitations, dyspnea on exertion, peripheral edema.  Respiratory: Negative for dyspnea at rest, dyspnea on exertion, cough, sputum, wheezing.  GI: See history of present illness. GU:  Negative for dysuria, hematuria, urinary incontinence, urinary frequency, nocturnal urination.  MS: Negative for joint pain, low back pain.  Derm: Negative for rash or itching.  Neuro:  Negative for weakness, abnormal sensation, seizure, frequent headaches, memory loss, confusion.  Psych: Negative for anxiety, depression Endo: Negative for unusual weight change.  Heme: Negative for bruising or bleeding. Allergy: Negative for rash or hives.  Physical Exam: Vital signs in last 24 hours: Temp:  [98.2 F (36.8 C)] 98.2 F (36.8 C) (09/17 0837) Pulse Rate:  [59] 59 (09/17 0837) Resp:  [19] 19 (09/17 0837) BP: (108)/(71) 108/71 (09/17 0837) SpO2:  [96 %] 96 % (09/17 0837) Weight:  [70.3 kg] 70.3 kg (09/17 0837)   General:   Alert,  Well-developed, well-nourished, pleasant and cooperative in NAD Head:  Normocephalic and  atraumatic. Eyes:  Sclera clear, no icterus.   Conjunctiva pink. Ears:  Normal auditory acuity. Nose:  No deformity, discharge,  or lesions. Msk:  Symmetrical without gross deformities. Normal posture. Extremities:  Without clubbing or edema. Neurologic:  Alert and  oriented x4;  grossly normal neurologically. Skin:  Intact without significant lesions or rashes. Psych:  Alert and cooperative. Normal mood and affect.   Impression/Plan: Katelyn Irwin is here for an EGD with possible dilation to be performed for GERD, dysphagia, abnormal CT esophagus  Risks, benefits, limitations, imponderables and alternatives regarding procedure have been reviewed with the patient. Questions have been answered. All parties agreeable.

## 2023-06-02 NOTE — Op Note (Signed)
[facility] Patient Name: Katelyn Irwin Procedure Date: 06/02/2023 8:34 AM MRN: [id number] Date of Birth: 1966/09/07 Attending MD: Hennie Duos. Marletta Lor , [place], [id number] CSN: [id number] Age: 57 Admit Type: Outpatient Procedure:                Upper GI endoscopy Indications:              Epigastric abdominal pain, Dysphagia, Heartburn Providers:                Hennie Duos. Marletta Lor, DO, Tammy Vaught, RN, Elinor Parkinson Referring MD:              Medicines:                See the Anesthesia note for documentation of the                            administered medications Complications:            No immediate complications. Estimated Blood Loss:     Estimated blood loss was minimal. Procedure:                Pre-Anesthesia Assessment:                           - The anesthesia plan was to use monitored                            anesthesia care (MAC).                           After obtaining informed consent, the endoscope was                            passed under direct vision. Throughout the                            procedure, the patient's blood pressure, pulse, and                            oxygen saturations were monitored continuously. The                            GIF-H190 ([id number]) scope was introduced through the                            mouth, and advanced to the second part of duodenum.                            The upper GI endoscopy was accomplished without                            difficulty. The patient tolerated the procedure                            well. Scope In:  9:02:04 AM Scope Out: 9:08:49 AM Total Procedure Duration: 0 hours 6 minutes 45 seconds  Findings:      A small hiatal hernia was present.      A mild Schatzki ring was found in the distal esophagus. A TTS dilator       was passed through the scope. Dilation with an 18-19-20 mm balloon       dilator was performed to 20 mm. The dilation site was examined and        showed mild mucosal disruption and moderate improvement in luminal       narrowing. Ring then further disrupted with cold forceps.      Localized mild inflammation characterized by erosions and erythema was       found in the gastric body. Biopsies were taken with a cold forceps for       Helicobacter pylori testing.      The duodenal bulb, first portion of the duodenum and second portion of       the duodenum were normal. Impression:               - Small hiatal hernia.                           - Mild Schatzki ring. Dilated.                           - Gastritis. Biopsied.                           - Normal duodenal bulb, first portion of the                            duodenum and second portion of the duodenum. Moderate Sedation:      Per Anesthesia Care Recommendation:           - Patient has a contact number available for                            emergencies. The signs and symptoms of potential                            delayed complications were discussed with the                            patient. Return to normal activities tomorrow.                            Written discharge instructions were provided to the                            patient.                           - Resume previous diet.                           - Continue present medications.                           -  Await pathology results.                           - Repeat upper endoscopy PRN for retreatment.                           - Return to GI clinic in 3 months.                           - Use Protonix (pantoprazole) 40 mg PO BID for 12                            weeks. Procedure Code(s):        --- Professional ---                           [id number], Esophagogastroduodenoscopy, flexible,                            transoral; with transendoscopic balloon dilation of                            esophagus (less than 30 mm diameter)                           43239, 59, Esophagogastroduodenoscopy, flexible,                             transoral; with biopsy, single or multiple Diagnosis Code(s):        --- Professional ---                           K44.9, Diaphragmatic hernia without obstruction or                            gangrene                           K22.2, Esophageal obstruction                           K29.70, Gastritis, unspecified, without bleeding                           R10.13, Epigastric pain                           R13.10, Dysphagia, unspecified                           R12, Heartburn CPT copyright 2022 American Medical Association. All rights reserved. The codes documented in this report are preliminary and upon coder review may  be revised to meet current compliance requirements. Hennie Duos. Marletta Lor, DO Hennie Duos. Marletta Lor, DO 06/02/2023 9:11:58 AM This report has been signed electronically. Number of Addenda: 0

## 2023-06-02 NOTE — Discharge Instructions (Addendum)
EGD Discharge instructions Please read the instructions outlined below and refer to this sheet in the next few weeks. These discharge instructions provide you with general information on caring for yourself after you leave the hospital. Your doctor may also give you specific instructions. While your treatment has been planned according to the most current medical practices available, unavoidable complications occasionally occur. If you have any problems or questions after discharge, please call your doctor. ACTIVITY You may resume your regular activity but move at a slower pace for the next 24 hours.  Take frequent rest periods for the next 24 hours.  Walking will help expel (get rid of) the air and reduce the bloated feeling in your abdomen.  No driving for 24 hours (because of the anesthesia (medicine) used during the test).  You may shower.  Do not sign any important legal documents or operate any machinery for 24 hours (because of the anesthesia used during the test).  NUTRITION Drink plenty of fluids.  You may resume your normal diet.  Begin with a light meal and progress to your normal diet.  Avoid alcoholic beverages for 24 hours or as instructed by your caregiver.  MEDICATIONS You may resume your normal medications unless your caregiver tells you otherwise.  WHAT YOU CAN EXPECT TODAY You may experience abdominal discomfort such as a feeling of fullness or "gas" pains.  FOLLOW-UP Your doctor will discuss the results of your test with you.  SEEK IMMEDIATE MEDICAL ATTENTION IF ANY OF THE FOLLOWING OCCUR: Excessive nausea (feeling sick to your stomach) and/or vomiting.  Severe abdominal pain and distention (swelling).  Trouble swallowing.  Temperature over 101 F (37.8 C).  Rectal bleeding or vomiting of blood.   Your EGD revealed mild amount inflammation in your stomach.  I took biopsies of this to rule out infection with a bacteria called H. pylori.    You also have a small  hiatal hernia.  You have a tightening of your esophagus called a Schatzki's ring.  I stretched this out today.  Hopefully this helps with your symptoms.  Small bowel appeared normal.  Await pathology results, my office will contact you.  I am going to increase your pantoprazole to twice daily for the next 12 weeks at which point you can decrease back down to once daily.  Follow-up in GI office in 3 months.Office will call with follow up appointment.  I hope you have a great rest of your week!  Hennie Duos. Marletta Lor, D.O. Gastroenterology and Hepatology [facility]

## 2023-06-02 NOTE — Anesthesia Preprocedure Evaluation (Signed)
Anesthesia Evaluation  Patient identified by MRN, date of birth, ID band Patient awake    Reviewed: Allergy & Precautions, H&P , NPO status , Patient's Chart, lab work & pertinent test results, reviewed documented beta blocker date and time   History of Anesthesia Complications (+) history of anesthetic complications  Airway Mallampati: II  TM Distance: >3 FB Neck ROM: full    Dental no notable dental hx.    Pulmonary neg pulmonary ROS, pneumonia   Pulmonary exam normal breath sounds clear to auscultation       Cardiovascular Exercise Tolerance: Good negative cardio ROS  Rhythm:regular Rate:Normal     Neuro/Psych negative neurological ROS  negative psych ROS   GI/Hepatic negative GI ROS, Neg liver ROS,,,  Endo/Other  negative endocrine ROS    Renal/GU negative Renal ROS  negative genitourinary   Musculoskeletal   Abdominal   Peds  Hematology negative hematology ROS (+)   Anesthesia Other Findings   Reproductive/Obstetrics negative OB ROS                             Anesthesia Physical Anesthesia Plan  ASA: 3  Anesthesia Plan: General   Post-op Pain Management:    Induction:   PONV Risk Score and Plan: Propofol infusion  Airway Management Planned:   Additional Equipment:   Intra-op Plan:   Post-operative Plan:   Informed Consent: I have reviewed the patients History and Physical, chart, labs and discussed the procedure including the risks, benefits and alternatives for the proposed anesthesia with the patient or authorized representative who has indicated his/her understanding and acceptance.     Dental Advisory Given  Plan Discussed with: CRNA  Anesthesia Plan Comments:        Anesthesia Quick Evaluation

## 2023-06-08 DIAGNOSIS — I890 Lymphedema, not elsewhere classified: Secondary | ICD-10-CM | POA: Diagnosis not present

## 2023-06-09 NOTE — Anesthesia Postprocedure Evaluation (Signed)
Anesthesia Post Note  Patient: Katelyn Irwin  Procedure(s) Performed: ESOPHAGOGASTRODUODENOSCOPY (EGD) WITH PROPOFOL BALLOON DILATION BIOPSY  Patient location during evaluation: Phase II Anesthesia Type: General Level of consciousness: awake Pain management: pain level controlled Vital Signs Assessment: post-procedure vital signs reviewed and stable Respiratory status: spontaneous breathing and respiratory function stable Cardiovascular status: blood pressure returned to baseline and stable Postop Assessment: no headache and no apparent nausea or vomiting Anesthetic complications: no Comments: Late entry   No notable events documented.   Last Vitals:  Vitals:   06/02/23 0837 06/02/23 0911  BP: 108/71 95/65  Pulse: (!) 59 (!) 55  Resp: 19 15  Temp: 36.8 C 36.6 C  SpO2: 96% 95%    Last Pain:  Vitals:   06/02/23 0911  TempSrc: Oral  PainSc: 0-No pain                 Windell Norfolk

## 2023-06-12 ENCOUNTER — Encounter (HOSPITAL_COMMUNITY): Payer: Self-pay | Admitting: Internal Medicine

## 2023-06-15 DIAGNOSIS — I890 Lymphedema, not elsewhere classified: Secondary | ICD-10-CM | POA: Diagnosis not present

## 2023-06-22 DIAGNOSIS — I890 Lymphedema, not elsewhere classified: Secondary | ICD-10-CM | POA: Diagnosis not present

## 2023-06-29 DIAGNOSIS — I890 Lymphedema, not elsewhere classified: Secondary | ICD-10-CM | POA: Diagnosis not present

## 2023-07-01 ENCOUNTER — Ambulatory Visit: Payer: BC Managed Care – PPO | Admitting: Gastroenterology

## 2023-07-01 DIAGNOSIS — R87611 Atypical squamous cells cannot exclude high grade squamous intraepithelial lesion on cytologic smear of cervix (ASC-H): Secondary | ICD-10-CM | POA: Diagnosis not present

## 2023-07-14 DIAGNOSIS — I890 Lymphedema, not elsewhere classified: Secondary | ICD-10-CM | POA: Diagnosis not present

## 2023-07-29 ENCOUNTER — Encounter: Payer: Self-pay | Admitting: Internal Medicine

## 2023-07-29 ENCOUNTER — Ambulatory Visit (INDEPENDENT_AMBULATORY_CARE_PROVIDER_SITE_OTHER): Payer: BC Managed Care – PPO | Admitting: Internal Medicine

## 2023-07-29 VITALS — BP 114/74 | HR 68 | Temp 97.9°F | Ht 66.0 in | Wt 159.3 lb

## 2023-07-29 DIAGNOSIS — R1013 Epigastric pain: Secondary | ICD-10-CM

## 2023-07-29 DIAGNOSIS — R1319 Other dysphagia: Secondary | ICD-10-CM

## 2023-07-29 DIAGNOSIS — G8929 Other chronic pain: Secondary | ICD-10-CM

## 2023-07-29 DIAGNOSIS — K219 Gastro-esophageal reflux disease without esophagitis: Secondary | ICD-10-CM

## 2023-07-29 NOTE — Progress Notes (Signed)
Referring Provider: Royann Shivers, * Primary Care Physician:  Royann Shivers, PA-C Primary GI:  Dr. Marletta Lor  Chief Complaint  Patient presents with   post procedure follow up    Follow up onEGD. Has had two episodes of chest pain since having EGD. Very mild.     HPI:   Katelyn Irwin is a 57 y.o. female who presents to clinic today for follow up visit.  Initially seen 04/23/2023.  History of epigastric/chest pain.  Moderate severity, occasionally severe, intermittent.  When it started it occurred every 2 weeks now sometimes more frequent, sometimes less, once a month.  Episodes last anywhere from 30 minutes to 8 hours long.  Pain radiates to her back.   Underwent cardiac workup which was largely unremarkable.   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   CT of her chest performed 09/04/2020 which showed hiatal hernia and esophageal wall thickening.    EGD 06/02/2023 small hiatal hernia, mild Schatzki's ring status post dilation with 20 mm balloon as well as further disruption with cold forceps.  Gastritis, biopsies negative for H. pylori.  Normal duodenum.  Started on pantoprazole 40 mg twice daily.  Last colonoscopy 2019 unremarkable recommended 10-year recall. Patient denies any family history of colorectal malignancy. No melena hematochezia. No unintentional weight loss.   Today, patient reports she is much better.  She is only had 2 episodes of the epigastric pain and these are both very mild compared to prior.  Esophageal dysphagia has resolved.    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   Lymphoma (HCC)    Lymphoma (HCC)    Pneumonia 2010   Pyloric stenosis, congenital    surgical repair as infant    Past Surgical History:  Procedure Laterality Date   BALLOON DILATION N/A 06/02/2023   Procedure: BALLOON DILATION;  Surgeon: Lanelle Bal, DO;  Location: [facility];  Service: Endoscopy;  Laterality: N/A;   BIOPSY  06/02/2023   Procedure: BIOPSY;  Surgeon: Lanelle Bal, DO;  Location: [facility];  Service: Endoscopy;;   COLONOSCOPY     ESOPHAGOGASTRODUODENOSCOPY (EGD) WITH PROPOFOL N/A 06/02/2023   Procedure: ESOPHAGOGASTRODUODENOSCOPY (EGD) WITH PROPOFOL;  Surgeon: Lanelle Bal, DO;  Location: [facility];  Service: Endoscopy;  Laterality: N/A;  10:00am, asa 2   LESION REMOVAL Right 02/03/2020   Procedure: EXCISION OF RIGHT FOREHEAD LESION;  Surgeon: Graylin Shiver, MD;  Location: [facility] OR;  Service: ENT;  Laterality: Right;   LYMPH NODE BIOPSY Left    neck   MANDIBLE RECONSTRUCTION     as child, age 40/ per pt was a 5th grader   pyloric stenosis surgery     as infant    Current Outpatient Medications  Medication Sig Dispense Refill   Multiple Vitamin (MULTIVITAMIN) tablet Take 1 tablet by mouth daily.     pantoprazole (PROTONIX) 40 MG tablet Take 1  tablet (40 mg total) by mouth 2 (two) times daily. 60 tablet 11   No current facility-administered medications for this visit.   Facility-Administered Medications Ordered in Other Visits  Medication Dose Route Frequency Provider Last Rate Last Admin   hyaluronate sodium (RADIAPLEXRX) gel   Topical BID Margaretmary Dys, MD        Allergies as of 07/29/2023   (No Known Allergies)    Family History  Problem Relation Age of Onset   Ovarian cancer Maternal Grandmother     Social History   Socioeconomic History   Marital status: Married    Spouse name: Not on file    Number of children: 3   Years of education: Not on file   Highest education level: Not on file  Occupational History   Occupation: Teacher  Tobacco Use   Smoking status: Never   Smokeless tobacco: Never  Vaping Use   Vaping status: Never Used  Substance and Sexual Activity   Alcohol use: No   Drug use: No   Sexual activity: Yes  Other Topics Concern   Not on file  Social History Narrative   Not on file   Social Determinants of Health   Financial Resource Strain: Not on file  Food Insecurity: Not on file  Transportation Needs: Not on file  Physical Activity: Not on file  Stress: Not on file  Social Connections: Not on file    Subjective: Review of Systems  Constitutional:  Negative for chills and fever.  HENT:  Negative for congestion and hearing loss.   Eyes:  Negative for blurred vision and double vision.  Respiratory:  Negative for cough and shortness of breath.   Cardiovascular:  Negative for chest pain and palpitations.  Gastrointestinal:  Positive for abdominal pain. Negative for blood in stool, constipation, diarrhea, heartburn, melena and vomiting.  Genitourinary:  Negative for dysuria and urgency.  Musculoskeletal:  Negative for joint pain and myalgias.  Skin:  Negative for itching and rash.  Neurological:  Negative for dizziness and headaches.  Psychiatric/Behavioral:  Negative for depression. The patient is not nervous/anxious.      Objective: There were no vitals taken for this visit. Physical Exam Constitutional:      Appearance: Normal appearance.  HENT:     Head: Normocephalic and atraumatic.  Eyes:     Extraocular Movements: Extraocular movements intact.     Conjunctiva/sclera: Conjunctivae normal.  Cardiovascular:     Rate and Rhythm: Normal rate and regular rhythm.  Pulmonary:     Effort: Pulmonary effort is normal.     Breath sounds: Normal breath sounds.  Abdominal:     General: Bowel sounds are normal.     Palpations: Abdomen is soft.   Musculoskeletal:        General: No swelling. Normal range of motion.     Cervical back: Normal range of motion and neck supple.  Skin:    General: Skin is warm and dry.     Coloration: Skin is not jaundiced.  Neurological:     General: No focal deficit present.     Mental Status: She is alert and oriented to person, place, and time.  Psychiatric:        Mood and Affect: Mood normal.        Behavior: Behavior normal.      Assessment: *Epigastric pain-improved *Chronic GERD-well-controlled *Esophageal dysphagia-resolved status post recent dilation *Colon cancer screening  Plan: Patient overall vastly improved today.  Continue on pantoprazole twice daily for another  3 to 4 weeks at which point she can decrease down to once daily as tolerated.  Colonoscopy recall 2029 for colon cancer screening purposes.  Follow-up in 6 months or sooner if needed.  07/29/2023 2:42 PM   Disclaimer: This note was dictated with voice recognition software. Similar sounding words can inadvertently be transcribed and may not be corrected upon review.

## 2023-07-29 NOTE — Patient Instructions (Signed)
I am happy to hear that you are doing much better.  Continue on pantoprazole twice daily for another month or so at which point you can trial down to 1 tablet daily.  Follow-up in 6 months or sooner if needed.  It was very nice seeing you again today.  Dr. Marletta Lor

## 2024-01-12 ENCOUNTER — Inpatient Hospital Stay: Payer: Self-pay

## 2024-01-12 ENCOUNTER — Inpatient Hospital Stay: Payer: BC Managed Care – PPO | Attending: Oncology | Admitting: Oncology

## 2024-01-12 ENCOUNTER — Other Ambulatory Visit: Payer: BC Managed Care – PPO

## 2024-01-12 VITALS — BP 115/73 | HR 57 | Temp 98.6°F | Resp 16 | Ht 66.0 in | Wt 156.4 lb

## 2024-01-12 DIAGNOSIS — C8510 Unspecified B-cell lymphoma, unspecified site: Secondary | ICD-10-CM

## 2024-01-12 DIAGNOSIS — Z8572 Personal history of non-Hodgkin lymphomas: Secondary | ICD-10-CM | POA: Diagnosis present

## 2024-01-12 DIAGNOSIS — Z923 Personal history of irradiation: Secondary | ICD-10-CM | POA: Insufficient documentation

## 2024-01-12 DIAGNOSIS — D72819 Decreased white blood cell count, unspecified: Secondary | ICD-10-CM | POA: Insufficient documentation

## 2024-01-12 LAB — CBC WITH DIFFERENTIAL (CANCER CENTER ONLY)
Abs Immature Granulocytes: 0.01 10*3/uL (ref 0.00–0.07)
Basophils Absolute: 0 10*3/uL (ref 0.0–0.1)
Basophils Relative: 1 %
Eosinophils Absolute: 0.1 10*3/uL (ref 0.0–0.5)
Eosinophils Relative: 3 %
HCT: 37.4 % (ref 36.0–46.0)
Hemoglobin: 12.5 g/dL (ref 12.0–15.0)
Immature Granulocytes: 0 %
Lymphocytes Relative: 39 %
Lymphs Abs: 2 10*3/uL (ref 0.7–4.0)
MCH: 31.2 pg (ref 26.0–34.0)
MCHC: 33.4 g/dL (ref 30.0–36.0)
MCV: 93.3 fL (ref 80.0–100.0)
Monocytes Absolute: 0.3 10*3/uL (ref 0.1–1.0)
Monocytes Relative: 6 %
Neutro Abs: 2.6 10*3/uL (ref 1.7–7.7)
Neutrophils Relative %: 51 %
Platelet Count: 230 10*3/uL (ref 150–400)
RBC: 4.01 MIL/uL (ref 3.87–5.11)
RDW: 13.1 % (ref 11.5–15.5)
WBC Count: 5.1 10*3/uL (ref 4.0–10.5)
nRBC: 0 % (ref 0.0–0.2)

## 2024-01-12 LAB — CMP (CANCER CENTER ONLY)
ALT: 33 U/L (ref 0–44)
AST: 23 U/L (ref 15–41)
Albumin: 4.8 g/dL (ref 3.5–5.0)
Alkaline Phosphatase: 28 U/L — ABNORMAL LOW (ref 38–126)
Anion gap: 10 (ref 5–15)
BUN: 14 mg/dL (ref 6–20)
CO2: 28 mmol/L (ref 22–32)
Calcium: 10.3 mg/dL (ref 8.9–10.3)
Chloride: 102 mmol/L (ref 98–111)
Creatinine: 0.92 mg/dL (ref 0.44–1.00)
GFR, Estimated: >60 mL/min (ref >60)
Glucose, Bld: 93 mg/dL (ref 70–99)
Potassium: 4.4 mmol/L (ref 3.5–5.1)
Sodium: 140 mmol/L (ref 135–145)
Total Bilirubin: 0.8 mg/dL (ref 0.0–1.2)
Total Protein: 6.8 g/dL (ref 6.5–8.1)

## 2024-01-12 LAB — LACTATE DEHYDROGENASE: LDH: 199 U/L — ABNORMAL HIGH (ref 98–192)

## 2024-01-12 LAB — TSH: TSH: 1.77 u[IU]/mL (ref 0.350–4.500)

## 2024-01-12 NOTE — Progress Notes (Signed)
  [facility] OFFICE PROGRESS NOTE   Diagnosis: Non-Hodgkin's lymphoma  INTERVAL HISTORY:   Katelyn Irwin returns as scheduled.  She feels well.  Good appetite and energy level.  No palpable lymph nodes.  No fever or night sweats. She had a zoster infection at the right chest wall earlier this month.  She was treated with antiviral therapy.  She reports minimal discomfort associated with the rash.  She had not received zoster vaccine.  Objective:  Vital signs in last 24 hours:  Blood pressure 115/73, pulse (!) 57, temperature 98.6 F (37 C), temperature source Oral, resp. rate 16, height 5\' 6"  (1.676 m), weight 156 lb 6.4 oz (70.9 kg), SpO2 100%.    HEENT: Oropharynx without visible mass, neck without mass, no evidence of recurrent tumor at the right supraorbital area Lymphatics: No cervical, supraclavicular, axillary, or inguinal nodes Resp: Lungs clear bilaterally Cardio: Rate and rhythm GI: No hepatosplenomegaly Vascular: No leg edema  Skin: Healed zoster rash at the right posterior/anterolateral chest  Lab Results:  Lab Results  Component Value Date   WBC 5.1 01/12/2024   HGB 12.5 01/12/2024   HCT 37.4 01/12/2024   MCV 93.3 01/12/2024   PLT 230 01/12/2024   NEUTROABS 2.6 01/12/2024    CMP  Lab Results  Component Value Date   NA 140 01/12/2024   K 4.4 01/12/2024   CL 102 01/12/2024   CO2 28 01/12/2024   GLUCOSE 93 01/12/2024   BUN 14 01/12/2024   CREATININE 0.92 01/12/2024   CALCIUM 10.3 01/12/2024   PROT 6.8 01/12/2024   ALBUMIN 4.8 01/12/2024   AST 23 01/12/2024   ALT 33 01/12/2024   ALKPHOS 28 (L) 01/12/2024   BILITOT 0.8 01/12/2024   GFRNONAA >60 01/12/2024   GFRAA >60 02/03/2020     Medications: I have reviewed the patient's current medications.   Assessment/Plan: High-grade B-cell non-Hodgkin's lymphoma presenting with pelvic pain found to have bulky pelvic lymphadenopathy January 2010. Status post 6 cycles of CHOP/Rituxan  with a  complete response. 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 Chronic leukopenia. 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 No clear evidence of active lymphoma. Extensive brown fat within the lower neck.   Restaging CT scans 06/23/2014-negative for lymphoma 5.  Firm fullness superior to the right orbit February 2021 CT maxillofacial 10/28/2019-subperiosteal soft tissue thickening in the right frontal region, just above the superior orbital rim measuring Excision right forehead lesion 02/03/2020-atypical lymphoid infiltrate consistent with non-Hodgkins B-cell lymphoma, low-grade process favored, unclear whether this represents recurrence of follicular lymphoma or a new disease process, possibly marginal zone lymphoma CTs neck/chest/abdomen/pelvis 02/21/2020-no lymphadenopathy or other findings of recurrent lymphoma in the chest, abdomen or pelvis; no evidence of disease in the neck Radiation to right frontal scalp/skull 03/08/2020-03/29/2020, 30 Gy in 15 fractions CTs neck/check/abdomen/pelvis 09/04/2020-stable appearance of the neck, no significant cervical adenopathy, no evidence of recurrent lymphoma 6.  Right chest zoster rash April 2025     Disposition: Katelyn Irwin is in clinical remission from non-Hodgkin's lymphoma.  She will return for an office and lab visit in 8 months.  She will call in the interim for new symptoms.  Coni Deep, MD  01/12/2024  3:13 PM

## 2024-06-02 ENCOUNTER — Ambulatory Visit: Admitting: Internal Medicine

## 2024-06-16 ENCOUNTER — Ambulatory Visit (INDEPENDENT_AMBULATORY_CARE_PROVIDER_SITE_OTHER): Admitting: Internal Medicine

## 2024-06-16 VITALS — BP 124/77 | HR 60 | Temp 98.2°F | Ht 66.0 in | Wt 161.4 lb

## 2024-06-16 DIAGNOSIS — R933 Abnormal findings on diagnostic imaging of other parts of digestive tract: Secondary | ICD-10-CM

## 2024-06-16 DIAGNOSIS — K219 Gastro-esophageal reflux disease without esophagitis: Secondary | ICD-10-CM | POA: Diagnosis not present

## 2024-06-16 DIAGNOSIS — K529 Noninfective gastroenteritis and colitis, unspecified: Secondary | ICD-10-CM | POA: Diagnosis not present

## 2024-06-16 DIAGNOSIS — G8929 Other chronic pain: Secondary | ICD-10-CM

## 2024-06-16 DIAGNOSIS — R1013 Epigastric pain: Secondary | ICD-10-CM

## 2024-06-16 DIAGNOSIS — Z1211 Encounter for screening for malignant neoplasm of colon: Secondary | ICD-10-CM

## 2024-06-16 MED ORDER — PANTOPRAZOLE SODIUM 40 MG PO TBEC: 40.0000 mg | DELAYED_RELEASE_TABLET | Freq: Every day | ORAL | 3 refills | Status: AC

## 2024-06-16 NOTE — Patient Instructions (Signed)
 I am going to check blood work today at [facility] to screen for celiac disease, alpha gal allergy, as well as check a few inflammatory markers.  We will call with results.  I want to start taking Imodium 1 tablet daily.  You can decrease down to half a tablet or a tablet every other day if you develop constipation.  I refilled your pantoprazole  today.  Follow-up in 6 weeks.  It is always a pleasure seeing you.  Dr. Cindie

## 2024-06-16 NOTE — Progress Notes (Signed)
 Referring Provider: Jeanette Comer BRAVO, * Primary Care Physician:  Skillman, Katherine E, PA-C Primary GI:  Dr. Cindie  Chief Complaint  Patient presents with   Follow-up    Follow up on GERD. Pt has had diarrhea off and on since a year. Worse past couple of months after meals    HPI:   Katelyn Irwin is a 58 y.o. female who presents to clinic today for follow up visit.    Initially seen 04/23/23 for epigastric/chest pain. At that time, moderate severity, occasionally severe, intermittent.  When it started it occurred every 2 weeks now sometimes more frequent, sometimes less, once a month.  Episodes last anywhere from 30 minutes to 8 hours long.  Pain radiates to her back.   Underwent cardiac workup which was largely unremarkable.   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   CT of her chest performed 09/04/2020 which showed hiatal hernia and esophageal wall thickening.    EGD 06/02/2023 small hiatal hernia, mild Schatzki's ring status post dilation with 20 mm balloon as well as further disruption with cold forceps.  Gastritis, biopsies negative for H. pylori.  Normal duodenum.  Started on pantoprazole  40 mg twice daily with vast improvement in symptoms. She has only had 2 episodes of the epigastric pain in a year and these are both very mild compared to prior.  Esophageal dysphagia has resolved. She has decreased her pantoprazole  to once daily.   Last colonoscopy 2019 unremarkable recommended 10-year recall. Patient denies any family history of colorectal malignancy. No melena hematochezia. No unintentional weight loss.   Today, does complain of worsening loose stools. Occurs every other day.  Will have 2-3 loose bowel movements, usually after dinner.  No abdominal pain or cramping.  Gallbladder in situ.    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   Lymphoma (HCC)    Lymphoma (HCC)    Pneumonia 2010   Pyloric stenosis, congenital    surgical repair as infant    Past Surgical History:  Procedure Laterality Date   BALLOON DILATION N/A 06/02/2023   Procedure: BALLOON DILATION;  Surgeon: Cindie Carlin POUR, DO;  Location: [facility];  Service: Endoscopy;  Laterality: N/A;   BIOPSY  06/02/2023   Procedure: BIOPSY;  Surgeon: Cindie Carlin POUR, DO;  [facility]: [facility];  Service: Endoscopy;;   COLONOSCOPY     ESOPHAGOGASTRODUODENOSCOPY (EGD) WITH PROPOFOL  N/A 06/02/2023   Procedure: ESOPHAGOGASTRODUODENOSCOPY (EGD) WITH PROPOFOL ;  Surgeon: Cindie Carlin POUR, DO;  [facility]: [facility];  Service: Endoscopy;  Laterality: N/A;  10:00am, asa 2   LESION REMOVAL Right 02/03/2020   Procedure: EXCISION OF RIGHT FOREHEAD LESION;  Surgeon: Terri Alan PARAS, MD;  Location: [facility];  Service: ENT;  Laterality: Right;   LYMPH NODE BIOPSY Left    neck   MANDIBLE RECONSTRUCTION     as child, age 71/ per pt was a 5th grader  pyloric stenosis surgery     as infant    Current Outpatient Medications  Medication Sig Dispense Refill   Multiple Vitamin (MULTIVITAMIN) tablet Take 1 tablet by mouth daily.     omega-3 acid ethyl esters (LOVAZA) 1 g capsule Take 1 g by mouth daily.     pantoprazole  (PROTONIX ) 40 MG tablet Take 1 tablet (40 mg total) by mouth 2 (two) times daily. 60 tablet 11   saccharomyces boulardii (FLORASTOR) 250 MG capsule Take 250 mg by mouth daily.     No current facility-administered medications for this visit.   Facility-Administered Medications Ordered in Other  Visits  Medication Dose Route Frequency Provider Last Rate Last Admin   hyaluronate sodium (RADIAPLEXRX) gel   Topical BID Patrcia Cough, MD        Allergies as of 06/16/2024   (No Known Allergies)    Family History  Problem Relation Age of Onset   Ovarian cancer Maternal Grandmother     Social History   Socioeconomic History   Marital status: Married    Spouse name: Not on file   Number of children: 3   Years of education: Not on file   Highest education level: 12th grade  Occupational History   Occupation: Runner, broadcasting/film/video  Tobacco Use   Smoking status: Never   Smokeless tobacco: Never  Vaping Use   Vaping status: Never Used  Substance and Sexual Activity   Alcohol use: No   Drug use: No   Sexual activity: Yes  Other Topics Concern   Not on file  Social History Narrative   Not on file   Social Drivers of Health   Financial Resource Strain: Low Risk  (06/13/2024)   Overall Financial Resource Strain (CARDIA)    Difficulty of Paying Living Expenses: Not hard at all  Food Insecurity: No Food Insecurity (06/13/2024)   Hunger Vital Sign    Worried About Running Out of Food in the Last Year: Never true    Ran Out of Food in the Last Year: Never true  Transportation Needs: No Transportation Needs (06/13/2024)   PRAPARE - Administrator, Civil Service (Medical): No    Lack of Transportation (Non-Medical): No  Physical Activity: Insufficiently Active (06/13/2024)   Exercise Vital Sign    Days of Exercise per Week: 2 days    Minutes of Exercise per Session: 20 min  Stress: No Stress Concern Present (06/13/2024)   [employer] of Occupational Health - Occupational Stress Questionnaire    Feeling of Stress: Not at all  Social Connections: Moderately Integrated (06/13/2024)   Social Connection and Isolation Panel    Frequency of Communication with Friends and Family: More than three times a week    Frequency of Social Gatherings with Friends and Family: Once a  week    Attends Religious Services: More than 4 times per year    Active Member of [employer] or Organizations: No    Attends Engineer, structural: Not on file    Marital Status: Married    Subjective: Review of Systems  Constitutional:  Negative for chills and fever.  HENT:  Negative for congestion and hearing loss.   Eyes:  Negative for blurred vision and double vision.  Respiratory:  Negative for cough and shortness of breath.   Cardiovascular:  Negative for chest pain and palpitations.  Gastrointestinal:  Positive for abdominal pain. Negative for blood in stool, constipation, diarrhea, heartburn, melena and vomiting.  Genitourinary:  Negative for dysuria and urgency.  Musculoskeletal:  Negative for joint pain and myalgias.  Skin:  Negative for itching and rash.  Neurological:  Negative for dizziness and headaches.  Psychiatric/Behavioral:  Negative for depression. The patient is not nervous/anxious.      Objective: BP 124/77   Pulse 60   Temp 98.2 F (36.8 C)   Ht 5' 6 (1.676 m)   Wt 161 lb 6.4 oz (73.2 kg)   BMI 26.05 kg/m  Physical Exam Constitutional:      Appearance: Normal appearance.  HENT:     Head: Normocephalic and atraumatic.  Eyes:     Extraocular Movements: Extraocular movements intact.     Conjunctiva/sclera: Conjunctivae normal.  Cardiovascular:     Rate and Rhythm: Normal rate and regular rhythm.  Pulmonary:     Effort: Pulmonary effort is normal.     Breath sounds: Normal breath sounds.  Abdominal:     General: Bowel sounds are normal.     Palpations: Abdomen is soft.  Musculoskeletal:        General: No swelling. Normal range of motion.     Cervical back: Normal range of motion and neck supple.  Skin:    General: Skin is warm and dry.     Coloration: Skin is not jaundiced.  Neurological:     General: No focal deficit present.     Mental Status: She is alert and oriented to person, place, and time.  Psychiatric:        Mood and  Affect: Mood normal.        Behavior: Behavior normal.      Assessment/Plan:  1.  Epigastric pain, chronic GERD-patient overall vastly improved today.  Continue on pantoprazole  daily.  2.  Esophageal dysphagia-resolved status post dilation.  Continue to monitor.  3.  Chronic diarrhea-progressively worsening, happening every other day.  Will check blood work today including celiac, ESR, CRP, alpha gal testing.  TSH WNL.  May need to update colonoscopy to rule out microscopic colitis.  Start Imodium 1 tablet daily titrate as needed.  Follow-up in 6 weeks or sooner if needed.    06/16/2024 10:21 AM   Disclaimer: This note was dictated with voice recognition software. Similar sounding words can inadvertently be transcribed and may not be corrected upon review.

## 2024-06-17 ENCOUNTER — Encounter: Payer: Self-pay | Admitting: Family Medicine

## 2024-06-17 ENCOUNTER — Ambulatory Visit (INDEPENDENT_AMBULATORY_CARE_PROVIDER_SITE_OTHER): Admitting: Family Medicine

## 2024-06-17 VITALS — BP 112/74 | HR 69 | Ht 66.0 in | Wt 160.0 lb

## 2024-06-17 DIAGNOSIS — K219 Gastro-esophageal reflux disease without esophagitis: Secondary | ICD-10-CM | POA: Diagnosis not present

## 2024-06-17 DIAGNOSIS — H9313 Tinnitus, bilateral: Secondary | ICD-10-CM

## 2024-06-17 DIAGNOSIS — Z8632 Personal history of gestational diabetes: Secondary | ICD-10-CM

## 2024-06-17 DIAGNOSIS — H9319 Tinnitus, unspecified ear: Secondary | ICD-10-CM | POA: Insufficient documentation

## 2024-06-17 DIAGNOSIS — R197 Diarrhea, unspecified: Secondary | ICD-10-CM | POA: Diagnosis not present

## 2024-06-17 DIAGNOSIS — Z13220 Encounter for screening for lipoid disorders: Secondary | ICD-10-CM

## 2024-06-17 DIAGNOSIS — I890 Lymphedema, not elsewhere classified: Secondary | ICD-10-CM

## 2024-06-17 DIAGNOSIS — Z8572 Personal history of non-Hodgkin lymphomas: Secondary | ICD-10-CM | POA: Diagnosis not present

## 2024-06-17 NOTE — Assessment & Plan Note (Signed)
In remission. Doing well at this time.

## 2024-06-17 NOTE — Assessment & Plan Note (Signed)
 Referring to ENT.

## 2024-06-17 NOTE — Progress Notes (Signed)
 Subjective:  Patient ID: Katelyn Irwin, female    DOB: 04-25-1966  Age: 58 y.o. MRN: [id number]  CC:   Chief Complaint  Patient presents with   Establish Care    HPI:  58 year old female with a history of non-Hodgkin's lymphoma (she has had 4 occurrences; currently in remission), GERD, lymphedema presents to establish care.  Overall patient is doing fairly well at this time.  She follows closely with oncology.  She has recently had some ongoing diarrhea.  Recently saw GI yesterday.  Awaiting lab results.  GERD is stable on Protonix .  Patient denies any recent lymphadenopathy.  No fevers, chills, night sweats.  Patient reports a history of gestational diabetes.  She is concerned about her blood glucose.  Last metabolic panel revealed a glucose of 93.  Patient needs screening lipid panel.  I can find no lipid panel in the chart.  She has had a prior stress test 2 years ago which was normal.  Patient does note that she has been experiencing tinnitus.  She would like to discuss evaluation for this today.  She states that it is constant.  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    Social Hx   Social History   Socioeconomic History   Marital status: Married    Spouse name: Not on file   Number of children: 3   Years of education: Not on file   Highest education level: 12th grade  Occupational History   Occupation: Runner, broadcasting/film/video  Tobacco Use   Smoking status: Never   Smokeless tobacco: Never  Vaping Use   Vaping status: Never Used  Substance and Sexual Activity   Alcohol use: No   Drug use: No   Sexual activity: Yes  Other Topics Concern   Not on file  Social  History Narrative   Not on file   Social Drivers of Health   Financial Resource Strain: Low Risk  (06/13/2024)   Overall Financial Resource Strain (CARDIA)    Difficulty of Paying Living Expenses: Not hard at all  Food Insecurity: No Food Insecurity (06/13/2024)   Hunger Vital Sign    Worried About Running Out of Food in the Last Year: Never true    Ran Out of Food in the Last Year: Never true  Transportation Needs: No Transportation Needs (06/13/2024)   PRAPARE - Administrator, Civil Service (Medical): No    Lack of Transportation (Non-Medical): No  Physical Activity: Insufficiently Active (06/13/2024)   Exercise Vital Sign    Days of Exercise per Week: 2 days    Minutes of Exercise per Session: 20 min  Stress: No Stress Concern Present (06/13/2024)   [employer] of Occupational Health - Occupational Stress Questionnaire    Feeling of Stress: Not at all  Social Connections: Moderately Integrated (06/13/2024)   Social Connection and Isolation Panel    Frequency of Communication with Friends and Family: More than three times a week    Frequency of Social Gatherings with Friends and Family: Once a week    Attends Religious Services: More than 4 times per year    Active Member of [employer] or Organizations: No    Attends Banker Meetings: Not on file  Marital Status: Married    Review of Systems Per HPI  Objective:  BP 112/74   Pulse 69   Ht 5' 6 (1.676 m)   Wt 160 lb (72.6 kg)   SpO2 98%   BMI 25.82 kg/m      06/17/2024    8:12 AM 06/16/2024   10:07 AM 01/12/2024    2:41 PM  BP/Weight  Systolic BP 112 124 115  Diastolic BP 74 77 73  Wt. (Lbs) 160 161.4 156.4  BMI 25.82 kg/m2 26.05 kg/m2 25.24 kg/m2    Physical Exam Vitals and nursing note reviewed.  Constitutional:      General: She is not in acute distress.    Appearance: Normal appearance.  HENT:     Head: Normocephalic and atraumatic.  Cardiovascular:     Rate and Rhythm: Normal  rate and regular rhythm.  Pulmonary:     Effort: Pulmonary effort is normal.     Breath sounds: Normal breath sounds.  Abdominal:     General: There is no distension.     Palpations: Abdomen is soft.     Tenderness: There is no abdominal tenderness.  Lymphadenopathy:     Comments: No appreciable lymphadenopathy.  Neurological:     Mental Status: She is alert.  Psychiatric:        Mood and Affect: Mood normal.        Behavior: Behavior normal.     Lab Results  Component Value Date   WBC 5.1 01/12/2024   HGB 12.5 01/12/2024   HCT 37.4 01/12/2024   PLT 230 01/12/2024   GLUCOSE 93 01/12/2024   ALT 33 01/12/2024   AST 23 01/12/2024   NA 140 01/12/2024   K 4.4 01/12/2024   CL 102 01/12/2024   CREATININE 0.92 01/12/2024   BUN 14 01/12/2024   CO2 28 01/12/2024   TSH 1.770 01/12/2024   INR 1.06 06/24/2011     Assessment & Plan:  History of non-Hodgkin's lymphoma Assessment & Plan: In remission.  Doing well at this time.   Lymphedema Assessment & Plan: Advised compression.   Gastroesophageal reflux disease, unspecified whether esophagitis present Assessment & Plan: Stable on Protonix .  Continue.   Screening, lipid -     Lipid panel  History of gestational diabetes -     Hemoglobin A1c  Diarrhea, unspecified type Assessment & Plan: Awaiting lab results.  Supportive care.   Tinnitus of both ears Assessment & Plan: Referring to ENT.  Orders: -     Ambulatory referral to ENT    Follow-up:  6 months  Talmadge Ganas Bluford DO [facility]

## 2024-06-17 NOTE — Patient Instructions (Signed)
 Labs today.  Follow up in 6 months.  Message with concerns.  Take care  Dr. Bluford

## 2024-06-17 NOTE — Assessment & Plan Note (Signed)
Stable on Protonix.  Continue. 

## 2024-06-17 NOTE — Assessment & Plan Note (Signed)
 Advised compression

## 2024-06-17 NOTE — Assessment & Plan Note (Signed)
 Awaiting lab results.  Supportive care.

## 2024-06-18 LAB — LIPID PANEL
Chol/HDL Ratio: 3.4 ratio (ref 0.0–4.4)
Cholesterol, Total: 212 mg/dL — ABNORMAL HIGH (ref 100–199)
HDL: 63 mg/dL (ref >39)
LDL Chol Calc (NIH): 131 mg/dL — ABNORMAL HIGH (ref 0–99)
Triglycerides: 100 mg/dL (ref 0–149)
VLDL Cholesterol Cal: 18 mg/dL (ref 5–40)

## 2024-06-18 LAB — HEMOGLOBIN A1C
Est. average glucose Bld gHb Est-mCnc: 108 mg/dL
Hgb A1c MFr Bld: 5.4 % (ref 4.8–5.6)

## 2024-06-19 LAB — C-REACTIVE PROTEIN: CRP: <1 mg/L (ref 0–10)

## 2024-06-19 LAB — SEDIMENTATION RATE: Sed Rate: 10 mm/h (ref 0–40)

## 2024-06-19 LAB — ALPHA-GAL PANEL
Allergen Lamb IgE: <0.1 kU/L
Beef IgE: <0.1 kU/L
IgE (Immunoglobulin E), Serum: 6 [IU]/mL (ref 6–495)
O215-IgE Alpha-Gal: <0.1 kU/L
Pork IgE: <0.1 kU/L

## 2024-06-19 LAB — CELIAC DISEASE PANEL
Endomysial IgA: NEGATIVE
IgA/Immunoglobulin A, Serum: 192 mg/dL (ref 87–352)
Transglutaminase IgA: <2 U/mL (ref 0–3)

## 2024-06-20 ENCOUNTER — Ambulatory Visit: Payer: Self-pay | Admitting: Family Medicine

## 2024-06-25 ENCOUNTER — Other Ambulatory Visit (HOSPITAL_BASED_OUTPATIENT_CLINIC_OR_DEPARTMENT_OTHER): Payer: Self-pay | Admitting: Obstetrics and Gynecology

## 2024-06-25 DIAGNOSIS — E2839 Other primary ovarian failure: Secondary | ICD-10-CM

## 2024-06-30 ENCOUNTER — Ambulatory Visit: Admitting: Internal Medicine

## 2024-08-03 ENCOUNTER — Ambulatory Visit: Admitting: Internal Medicine

## 2024-08-03 ENCOUNTER — Encounter: Payer: Self-pay | Admitting: Internal Medicine

## 2024-08-03 VITALS — BP 119/83 | HR 61 | Temp 97.5°F | Ht 66.0 in | Wt 159.1 lb

## 2024-08-03 DIAGNOSIS — Z1211 Encounter for screening for malignant neoplasm of colon: Secondary | ICD-10-CM | POA: Diagnosis not present

## 2024-08-03 DIAGNOSIS — R1013 Epigastric pain: Secondary | ICD-10-CM

## 2024-08-03 DIAGNOSIS — K529 Noninfective gastroenteritis and colitis, unspecified: Secondary | ICD-10-CM

## 2024-08-03 DIAGNOSIS — G8929 Other chronic pain: Secondary | ICD-10-CM

## 2024-08-03 DIAGNOSIS — K219 Gastro-esophageal reflux disease without esophagitis: Secondary | ICD-10-CM | POA: Diagnosis not present

## 2024-08-03 NOTE — Patient Instructions (Signed)
 I am happy to hear that you are feeling better.  Continue on Imodium 1/2 tablet daily.  Continue on pantoprazole  for your chronic acid reflux.  Follow-up in 3 to 4 months.  Let us  know if your symptoms worsen or you develop other symptoms such as bleeding or abdominal pain in the meantime.  It was very nice seeing you again today.  Dr. Cindie

## 2024-08-03 NOTE — Progress Notes (Signed)
 Referring Provider: Skillman, Katherine E, * Primary Care Physician:  Cook, Jayce G, DO Primary GI:  Dr. Cindie  Chief Complaint  Patient presents with   Follow-up    Patient here today for a follow up on diarrhea. She is having around one one to two loose stools per day. She is taking 1/2 of an imodium daily.    HPI:   Katelyn Irwin is a 58 y.o. female who presents to clinic today for follow up visit.    Epigastric pain, dysphagia: Initially seen 04/23/23 for epigastric/chest pain. At that time, moderate severity, occasionally severe, intermittent.  When it started it occurred every 2 weeks now sometimes more frequent, sometimes less, once a month.  Episodes last anywhere from 30 minutes to 8 hours long.  Pain radiates to her back.   Underwent cardiac workup which was largely unremarkable.   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   CT of her chest performed 09/04/2020 which showed hiatal hernia and esophageal wall thickening.    EGD 06/02/2023 small hiatal hernia, mild Schatzki's ring status post dilation with 20 mm balloon as well as further disruption with cold forceps.  Gastritis, biopsies negative for H. pylori.  Normal duodenum.  Started on pantoprazole  40 mg twice daily with vast improvement in symptoms. She has only had 2 episodes of the epigastric pain in a year and these are both very mild compared to prior.  Esophageal dysphagia has resolved. She has decreased her pantoprazole  to once daily.   Colon cancer screening: Last colonoscopy 2019 unremarkable recommended 10-year recall. Patient denies any family history of colorectal malignancy. No melena hematochezia. No unintentional weight loss.   Chronic diarrhea: Follow-up visit 06/16/2024 patient concerned about worsening loose stools x1 year. Occurs every other day.  Will have 2-3 loose bowel movements, usually after dinner.  No abdominal pain or cramping.  Gallbladder in situ.  Celiac testing, alpha gal, ESR/CRP  WNL.  Started taking Imodium 1/2 tablet each morning and reports her symptoms are improved.     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   Lymphoma (HCC)    Lymphoma (HCC)    Pneumonia 2010   Pyloric stenosis, congenital (HCC)    surgical repair as infant    Past Surgical History:  Procedure Laterality Date   BALLOON DILATION N/A 06/02/2023   Procedure: BALLOON DILATION;  Surgeon: Cindie Carlin POUR, DO;  [facility];  Service: Endoscopy;  Laterality: N/A;   BIOPSY  06/02/2023   Procedure: BIOPSY;  Surgeon: Cindie Carlin POUR, DO;  [facility];  Service: Endoscopy;;   COLONOSCOPY     ESOPHAGOGASTRODUODENOSCOPY (EGD) WITH PROPOFOL  N/A 06/02/2023   Procedure: ESOPHAGOGASTRODUODENOSCOPY (EGD) WITH PROPOFOL ;  Surgeon: Cindie Carlin POUR, DO;  [facility];  Service: Endoscopy;  Laterality: N/A;  10:00am, asa 2   LESION REMOVAL Right 02/03/2020   Procedure: EXCISION OF RIGHT FOREHEAD LESION;  Surgeon: Terri,  Alan PARAS, MD;  Location: [facility];  Service: ENT;  Laterality: Right;   LYMPH NODE BIOPSY Left    neck   MANDIBLE RECONSTRUCTION     as child, age 66/ per pt was a 5th grader   pyloric stenosis surgery     as infant    Current Outpatient Medications  Medication Sig Dispense Refill   loperamide (IMODIUM A-D) 2 MG tablet Take 2 mg by mouth. 1 mg daily.     Multiple Vitamin (MULTIVITAMIN) tablet Take 1 tablet by mouth daily.     omega-3 acid ethyl esters (LOVAZA) 1 g  capsule Take 1 g by mouth daily.     pantoprazole  (PROTONIX ) 40 MG tablet Take 1 tablet (40 mg total) by mouth daily. 90 tablet 3   No current facility-administered medications for this visit.   Facility-Administered Medications Ordered in Other Visits  Medication Dose Route Frequency Provider Last Rate Last Admin   hyaluronate sodium (RADIAPLEXRX) gel   Topical BID Patrcia Cough, MD        Allergies as of 08/03/2024   (No Known Allergies)    Family History  Problem Relation Age of Onset   Ovarian cancer Maternal Grandmother     Social History   Socioeconomic History   Marital status: Married    Spouse name: Not on file   Number of children: 3   Years of education: Not on file   Highest education level: 12th grade  Occupational History   Occupation: Runner, Broadcasting/film/video  Tobacco Use   Smoking status: Never   Smokeless tobacco: Never  Vaping Use   Vaping status: Never Used  Substance and Sexual Activity   Alcohol use: No   Drug use: No   Sexual activity: Yes  Other Topics Concern   Not on file  Social History Narrative   Not on file   Social Drivers of Health   Financial Resource Strain: Low Risk  (06/13/2024)   Overall Financial Resource Strain (CARDIA)    Difficulty of Paying Living Expenses: Not hard at all  Food Insecurity: Low Risk  (07/04/2024)   Received from [facility]   Hunger Vital Sign    Within the past 12 months, you worried that your food would run out before you got money to buy more: Never true    Within the past 12 months, the food you bought just didn't last and you didn't have money to get more. : Never true  Transportation Needs: No Transportation Needs (07/04/2024)   Received from [facility]    In the past 12 months, has lack of reliable transportation kept you from medical appointments, meetings, work or from getting things needed for daily living? : No  Physical Activity: Insufficiently Active (06/13/2024)   Exercise Vital  Sign    Days of Exercise per Week: 2 days    Minutes of Exercise per Session: 20 min  Stress: No Stress Concern Present (06/13/2024)   [employer] of Occupational Health - Occupational Stress Questionnaire    Feeling of Stress: Not at all  Social Connections: Moderately Integrated (06/13/2024)   Social Connection and Isolation Panel    Frequency of Communication with Friends and Family: More than three times a week    Frequency of Social Gatherings with Friends and Family: Once a week    Attends Religious Services: More than 4 times per year    Active Member of [employer] or Organizations: No    Attends Banker Meetings: Not on  file    Marital Status: Married    Subjective: Review of Systems  Constitutional:  Negative for chills and fever.  HENT:  Negative for congestion and hearing loss.   Eyes:  Negative for blurred vision and double vision.  Respiratory:  Negative for cough and shortness of breath.   Cardiovascular:  Negative for chest pain and palpitations.  Gastrointestinal:  Positive for abdominal pain. Negative for blood in stool, constipation, diarrhea, heartburn, melena and vomiting.  Genitourinary:  Negative for dysuria and urgency.  Musculoskeletal:  Negative for joint pain and myalgias.  Skin:  Negative for itching and rash.  Neurological:  Negative for dizziness and headaches.  Psychiatric/Behavioral:  Negative for depression. The patient is not nervous/anxious.      Objective: BP 119/83 (BP Location: Left Arm, Patient Position: Sitting, Cuff Size: Normal)   Pulse 61   Temp (!) 97.5 F (36.4 C) (Temporal)   Ht 5' 6 (1.676 m)   Wt 159 lb 1.6 oz (72.2 kg)   BMI 25.68 kg/m  Physical Exam Constitutional:      Appearance: Normal appearance.  HENT:     Head: Normocephalic and atraumatic.  Eyes:     Extraocular Movements: Extraocular movements intact.     Conjunctiva/sclera: Conjunctivae normal.  Cardiovascular:     Rate and Rhythm: Normal rate and  regular rhythm.  Pulmonary:     Effort: Pulmonary effort is normal.     Breath sounds: Normal breath sounds.  Abdominal:     General: Bowel sounds are normal.     Palpations: Abdomen is soft.  Musculoskeletal:        General: No swelling. Normal range of motion.     Cervical back: Normal range of motion and neck supple.  Skin:    General: Skin is warm and dry.     Coloration: Skin is not jaundiced.  Neurological:     General: No focal deficit present.     Mental Status: She is alert and oriented to person, place, and time.  Psychiatric:        Mood and Affect: Mood normal.        Behavior: Behavior normal.      Assessment/Plan:  1.  Epigastric pain, chronic GERD-patient overall vastly improved today.  Continue on pantoprazole  daily.  2.  Esophageal dysphagia-resolved status post dilation.  Continue to monitor.  3.  Chronic diarrhea- improved on imodium 1/2 tablet daily. Will continue. Celiac, ESR, CRP, alpha gal testing, TSH WNL  Follow-up in 3-4 months or sooner if needed.  08/03/2024 2:53 PM   Disclaimer: This note was dictated with voice recognition software. Similar sounding words can inadvertently be transcribed and may not be corrected upon review.

## 2024-09-19 ENCOUNTER — Other Ambulatory Visit

## 2024-09-19 ENCOUNTER — Ambulatory Visit: Admitting: Oncology

## 2024-09-19 ENCOUNTER — Inpatient Hospital Stay: Admitting: Nurse Practitioner

## 2024-09-19 ENCOUNTER — Inpatient Hospital Stay

## 2024-09-20 ENCOUNTER — Inpatient Hospital Stay: Admitting: Nurse Practitioner

## 2024-09-20 ENCOUNTER — Encounter: Payer: Self-pay | Admitting: Nurse Practitioner

## 2024-09-20 ENCOUNTER — Inpatient Hospital Stay: Attending: Nurse Practitioner

## 2024-09-20 VITALS — BP 119/81 | HR 64 | Temp 97.8°F | Resp 18 | Ht 66.0 in | Wt 158.4 lb

## 2024-09-20 DIAGNOSIS — Z923 Personal history of irradiation: Secondary | ICD-10-CM | POA: Insufficient documentation

## 2024-09-20 DIAGNOSIS — C8510 Unspecified B-cell lymphoma, unspecified site: Secondary | ICD-10-CM

## 2024-09-20 DIAGNOSIS — D72819 Decreased white blood cell count, unspecified: Secondary | ICD-10-CM | POA: Diagnosis not present

## 2024-09-20 DIAGNOSIS — C8516 Unspecified B-cell lymphoma, intrapelvic lymph nodes: Secondary | ICD-10-CM | POA: Diagnosis present

## 2024-09-20 DIAGNOSIS — Z79899 Other long term (current) drug therapy: Secondary | ICD-10-CM | POA: Diagnosis not present

## 2024-09-20 LAB — LACTATE DEHYDROGENASE: LDH: 198 U/L (ref 105–235)

## 2024-09-20 LAB — CBC WITH DIFFERENTIAL (CANCER CENTER ONLY)
Abs Immature Granulocytes: 0.01 K/uL (ref 0.00–0.07)
Basophils Absolute: 0 K/uL (ref 0.0–0.1)
Basophils Relative: 0 %
Eosinophils Absolute: 0.2 K/uL (ref 0.0–0.5)
Eosinophils Relative: 4 %
HCT: 39.9 % (ref 36.0–46.0)
Hemoglobin: 13.7 g/dL (ref 12.0–15.0)
Immature Granulocytes: 0 %
Lymphocytes Relative: 43 %
Lymphs Abs: 2.1 K/uL (ref 0.7–4.0)
MCH: 30.9 pg (ref 26.0–34.0)
MCHC: 34.3 g/dL (ref 30.0–36.0)
MCV: 89.9 fL (ref 80.0–100.0)
Monocytes Absolute: 0.4 K/uL (ref 0.1–1.0)
Monocytes Relative: 8 %
Neutro Abs: 2.2 K/uL (ref 1.7–7.7)
Neutrophils Relative %: 45 %
Platelet Count: 228 K/uL (ref 150–400)
RBC: 4.44 MIL/uL (ref 3.87–5.11)
RDW: 12.3 % (ref 11.5–15.5)
WBC Count: 4.9 K/uL (ref 4.0–10.5)
nRBC: 0 % (ref 0.0–0.2)

## 2024-09-20 LAB — CMP (CANCER CENTER ONLY)
ALT: 23 U/L (ref 0–44)
AST: 21 U/L (ref 15–41)
Albumin: 4.6 g/dL (ref 3.5–5.0)
Alkaline Phosphatase: 31 U/L — ABNORMAL LOW (ref 38–126)
Anion gap: 10 (ref 5–15)
BUN: 15 mg/dL (ref 6–20)
CO2: 30 mmol/L (ref 22–32)
Calcium: 10.4 mg/dL — ABNORMAL HIGH (ref 8.9–10.3)
Chloride: 101 mmol/L (ref 98–111)
Creatinine: 0.91 mg/dL (ref 0.44–1.00)
GFR, Estimated: >60 mL/min
Glucose, Bld: 92 mg/dL (ref 70–99)
Potassium: 4.2 mmol/L (ref 3.5–5.1)
Sodium: 141 mmol/L (ref 135–145)
Total Bilirubin: 1.2 mg/dL (ref 0.0–1.2)
Total Protein: 7.3 g/dL (ref 6.5–8.1)

## 2024-09-20 LAB — TSH: TSH: 3.69 u[IU]/mL (ref 0.350–4.500)

## 2024-09-20 NOTE — Progress Notes (Signed)
" °  [facility] OFFICE PROGRESS NOTE   Diagnosis: Non-Hodgkin's lymphoma  INTERVAL HISTORY:   Katelyn Irwin returns as scheduled.  She feels well.  No fevers or sweats.  She has a good appetite.  No weight loss.  She denies pain.  No enlarged lymph nodes.  Objective:  Vital signs in last 24 hours:  Blood pressure 119/81, pulse 64, temperature 97.8 F (36.6 C), temperature source Temporal, resp. rate 18, height 5' 6 (1.676 m), weight 158 lb 6.4 oz (71.8 kg), SpO2 100%.    HEENT: No thrush or ulcers.  No visible oropharynx mass.  No evidence of recurrent tumor at the right supraorbital region. Lymphatics: No palpable cervical, supraclavicular, axillary or inguinal lymph nodes. Resp: Lungs clear bilaterally. Cardio: Regular rate and rhythm. GI: No hepatosplenomegaly. Vascular: No leg edema.   Lab Results:  Lab Results  Component Value Date   WBC 4.9 09/20/2024   HGB 13.7 09/20/2024   HCT 39.9 09/20/2024   MCV 89.9 09/20/2024   PLT 228 09/20/2024   NEUTROABS 2.2 09/20/2024    Imaging:  No results found.  Medications: I have reviewed the patient's current medications.  Assessment/Plan: High-grade B-cell non-Hodgkin's lymphoma presenting with pelvic pain found to have bulky pelvic lymphadenopathy January 2010. Status post 6 cycles of CHOP/Rituxan  with a complete response. 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 Chronic leukopenia. 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 No clear evidence of active lymphoma. Extensive brown fat within the lower neck.   Restaging CT scans 06/23/2014-negative for lymphoma 5.  Firm fullness superior to the right orbit February 2021 CT maxillofacial  10/28/2019-subperiosteal soft tissue thickening in the right frontal region, just above the superior orbital rim measuring Excision right forehead lesion 02/03/2020-atypical lymphoid infiltrate consistent with non-Hodgkins B-cell lymphoma, low-grade process favored, unclear whether this represents recurrence of follicular lymphoma or a new disease process, possibly marginal zone lymphoma CTs neck/chest/abdomen/pelvis 02/21/2020-no lymphadenopathy or other findings of recurrent lymphoma in the chest, abdomen or pelvis; no evidence of disease in the neck Radiation to right frontal scalp/skull 03/08/2020-03/29/2020, 30 Gy in 15 fractions CTs neck/check/abdomen/pelvis 09/04/2020-stable appearance of the neck, no significant cervical adenopathy, no evidence of recurrent lymphoma 6.  Right chest zoster rash April 2025  Disposition: Katelyn Irwin remains in clinical remission from non-Hodgkin's lymphoma.  She will return for lab and follow-up in 8 months.  We are available to see her sooner if needed.    Olam Ned ANP/GNP-BC   09/20/2024  1:30 PM        "

## 2024-11-24 ENCOUNTER — Other Ambulatory Visit (HOSPITAL_BASED_OUTPATIENT_CLINIC_OR_DEPARTMENT_OTHER)

## 2024-12-16 ENCOUNTER — Ambulatory Visit: Admitting: Family Medicine

## 2025-05-29 ENCOUNTER — Inpatient Hospital Stay

## 2025-05-29 ENCOUNTER — Inpatient Hospital Stay: Admitting: Oncology
